# Patient Record
Sex: Female | Born: 1935
Health system: Southern US, Community
[De-identification: ages and names within clinical notes are randomized; demographics above are authoritative.]

## PROBLEM LIST (undated history)

## (undated) DIAGNOSIS — F3289 Other specified depressive episodes: Secondary | ICD-10-CM

## (undated) DIAGNOSIS — M8580 Other specified disorders of bone density and structure, unspecified site: Secondary | ICD-10-CM

## (undated) DIAGNOSIS — E039 Hypothyroidism, unspecified: Secondary | ICD-10-CM

## (undated) DIAGNOSIS — F458 Other somatoform disorders: Secondary | ICD-10-CM

## (undated) DIAGNOSIS — E559 Vitamin D deficiency, unspecified: Secondary | ICD-10-CM

## (undated) DIAGNOSIS — Z9289 Personal history of other medical treatment: Secondary | ICD-10-CM

## (undated) DIAGNOSIS — I10 Essential (primary) hypertension: Secondary | ICD-10-CM

## (undated) DIAGNOSIS — H9190 Unspecified hearing loss, unspecified ear: Secondary | ICD-10-CM

## (undated) DIAGNOSIS — E785 Hyperlipidemia, unspecified: Secondary | ICD-10-CM

## (undated) DIAGNOSIS — F329 Major depressive disorder, single episode, unspecified: Secondary | ICD-10-CM

## (undated) DIAGNOSIS — I480 Paroxysmal atrial fibrillation: Secondary | ICD-10-CM

## (undated) DIAGNOSIS — G4700 Insomnia, unspecified: Secondary | ICD-10-CM

## (undated) HISTORY — PX: TONSILLECTOMY: SUR1361

## (undated) HISTORY — DX: Unspecified hearing loss, unspecified ear: H91.90

## (undated) HISTORY — DX: Major depressive disorder, single episode, unspecified: F32.9

## (undated) HISTORY — DX: Personal history of other medical treatment: Z92.89

## (undated) HISTORY — DX: Hyperlipidemia, unspecified: E78.5

## (undated) HISTORY — DX: Other specified disorders of bone density and structure, unspecified site: M85.80

## (undated) HISTORY — DX: Hypothyroidism, unspecified: E03.9

## (undated) HISTORY — DX: Other specified depressive episodes: F32.89

## (undated) HISTORY — DX: Paroxysmal atrial fibrillation: I48.0

## (undated) HISTORY — DX: Vitamin D deficiency, unspecified: E55.9

## (undated) HISTORY — DX: Other somatoform disorders: F45.8

## (undated) HISTORY — DX: Insomnia, unspecified: G47.00

## (undated) HISTORY — DX: Essential (primary) hypertension: I10

---

## 2005-09-24 LAB — HM COLONOSCOPY: HM Colonoscopy: NORMAL

## 2007-09-25 DIAGNOSIS — I480 Paroxysmal atrial fibrillation: Secondary | ICD-10-CM

## 2007-09-25 HISTORY — DX: Paroxysmal atrial fibrillation: I48.0

## 2007-09-25 HISTORY — PX: APPENDECTOMY: SHX54

## 2009-03-14 ENCOUNTER — Encounter: Payer: Self-pay | Admitting: Internal Medicine

## 2009-03-14 ENCOUNTER — Ambulatory Visit: Payer: Self-pay | Admitting: Internal Medicine

## 2009-03-14 DIAGNOSIS — R002 Palpitations: Secondary | ICD-10-CM | POA: Insufficient documentation

## 2009-03-14 DIAGNOSIS — I10 Essential (primary) hypertension: Secondary | ICD-10-CM | POA: Insufficient documentation

## 2009-03-22 ENCOUNTER — Encounter: Payer: Self-pay | Admitting: Internal Medicine

## 2009-03-29 ENCOUNTER — Telehealth: Payer: Self-pay | Admitting: Internal Medicine

## 2009-04-05 ENCOUNTER — Ambulatory Visit: Payer: Self-pay

## 2009-04-05 ENCOUNTER — Encounter: Payer: Self-pay | Admitting: Internal Medicine

## 2009-04-06 ENCOUNTER — Telehealth: Payer: Self-pay | Admitting: Cardiology

## 2009-04-12 ENCOUNTER — Ambulatory Visit: Payer: Self-pay | Admitting: Family Medicine

## 2009-04-13 ENCOUNTER — Ambulatory Visit: Payer: Self-pay | Admitting: Cardiology

## 2009-04-13 ENCOUNTER — Ambulatory Visit (HOSPITAL_COMMUNITY): Admission: RE | Admit: 2009-04-13 | Discharge: 2009-04-13 | Payer: Self-pay | Admitting: Cardiology

## 2009-04-13 ENCOUNTER — Encounter: Payer: Self-pay | Admitting: Internal Medicine

## 2009-05-25 DIAGNOSIS — Z9289 Personal history of other medical treatment: Secondary | ICD-10-CM

## 2009-05-25 HISTORY — DX: Personal history of other medical treatment: Z92.89

## 2009-06-15 ENCOUNTER — Ambulatory Visit: Payer: Self-pay | Admitting: Internal Medicine

## 2010-03-10 ENCOUNTER — Ambulatory Visit: Payer: Self-pay | Admitting: Cardiovascular Disease

## 2010-03-10 DIAGNOSIS — R079 Chest pain, unspecified: Secondary | ICD-10-CM | POA: Insufficient documentation

## 2010-06-18 ENCOUNTER — Inpatient Hospital Stay: Payer: Self-pay | Admitting: Internal Medicine

## 2010-06-21 ENCOUNTER — Ambulatory Visit: Payer: Self-pay | Admitting: Family Medicine

## 2010-07-03 ENCOUNTER — Ambulatory Visit: Payer: Self-pay | Admitting: Family Medicine

## 2010-07-11 ENCOUNTER — Telehealth: Payer: Self-pay | Admitting: Internal Medicine

## 2010-10-02 ENCOUNTER — Telehealth: Payer: Self-pay | Admitting: Cardiovascular Disease

## 2010-10-24 NOTE — Progress Notes (Signed)
Summary: regarding allergy med  Phone Note Call from Patient Call back at Home Phone [phone]   Caller: Spouse- Charles Call For: Dr. Alphonsus Sias Summary of Call: Pt's husband says pt was seen at [facility] yesterday and was prescribed something for allergies.  He said this was to be a 6 hour pill, but what came from the pharmacy was for every 12 hours.  Please advise. Initial call taken by: Lowella Petties CMA,  July 11, 2010 3:59 PM  Follow-up for Phone Call        I spoke to him later in the day today and Dr Dayton Martes will see her tomorrow  was given loratadine---I never said anything about a "6 hour pill" Follow-up by: Cindee Salt MD,  July 11, 2010 7:43 PM

## 2010-10-24 NOTE — Assessment & Plan Note (Signed)
Summary: EC6/sgc   Visit Type:  Initial Consult Primary Provider:  Nilda Simmer, MD  CC:  Chest pain and .  History of Present Illness: 77 y/o with h/o HTN, HL and PAF, who presents for evaluation of recent episodes of chest pain.  She reports that she had 2 episodes last week, the first was for 30 minutes the second episode was for 10 minutes. They were 2-3/10 on a pain scale. The second one came on prior to giving a talk and she was very nervous. The symptoms came on typically at rest. She has been otherwise active, swims, walks her dog but no significant symptoms of chest pain or shortness of breath.  She does feel very fatigued, has some lower abdominal discomfort which is chronic. She wonders if her symptoms may be due to depression. She's had depression before and wonders if this is similar.  Echo with normal LV function but some ? of LV trabecualtions so cardiac MRI ordered and was normal.  Monitor rshowing One brief run of SVT (5 beats) no recurrent AF.   EKG shows normal sinus rhythm with a rate 63 beats per minute, no significant ST or T wave changes.         Current Medications (verified): 1)  Alendronate Sodium 70 Mg Tabs (Alendronate Sodium) .Marland Kitchen.. 1 Every Two Weeks 2)  Synthroid 75 Mcg Tabs (Levothyroxine Sodium) .Marland Kitchen.. 1 By Mouth Once Daily 3)  Aspirin 81 Mg Tbec (Aspirin) .... Take One Tablet By Mouth Daily 4)  Fish Oil 1200 Mg Caps (Omega-3 Fatty Acids) .Marland Kitchen.. 1 By Mouth Once Daily 5)  Calcium-D 600-200 Mg-Unit Tabs (Calcium Carbonate-Vitamin D) .Marland Kitchen.. 1 By Mouth Once Daily 6)  Vitamin D3 1000 Unit Caps (Cholecalciferol) .Marland Kitchen.. 1 By Mouth Once Daily 7)  Metoprolol Succinate 50 Mg Xr24h-Tab (Metoprolol Succinate) .... Take One Tablet By Mouth Daily 8)  Ambien 5 Mg Tabs (Zolpidem Tartrate) .... Take 1 Tablet By Mouth At Bedtime  Allergies (verified): 1)  ! * Dustmites 2)  ! * Mold 3)  ! * Contrast Dye  Past History:  Past Medical History: Last updated:  06/15/2009 Allergic rhinitis Hypothyroidism Hypertension Hyperlipidemia Osteopenia Paroxysmal Atrial Fibrillation- 2009 [facility] Cardiac MRI 9/10 - normal  Family History: Last updated: 2009/03/13 Father: Deceased; pneumonia; 66 Mother: Deceased 94; heart disease Sister: living-79  Social History: Last updated: 03/14/2009 Retired  Married  Children 3 Tobacco Use - No.  Alcohol Use - yes. 1 glass of wine per night Regular Exercise - yes Drug Use - no  Risk Factors: Exercise: yes (March 13, 2009)  Risk Factors: Smoking Status: never (2009/03/13)  Review of Systems       The patient complains of chest pain.  The patient denies fever, weight loss, weight gain, vision loss, decreased hearing, hoarseness, syncope, dyspnea on exertion, peripheral edema, prolonged cough, abdominal pain, incontinence, muscle weakness, depression, and enlarged lymph nodes.         Poor sleep, needs ambien, fatigue, ABD discomfort  Vital Signs:  Patient profile:   77 year old female Height:      56 inches Weight:      136 pounds BMI:     30.60 Pulse rate:   64 / minute BP sitting:   116 / 72  (left arm) Cuff size:   regular  Physical Exam  General:  Well developed, well nourished, in no acute distress. Head:  normocephalic and atraumatic Neck:  Neck supple, no JVD. No masses, thyromegaly or abnormal cervical nodes. Lungs:  Clear bilaterally  to auscultation and percussion. Heart:  Non-displaced PMI, chest non-tender; regular rate and rhythm, S1, S2 without murmurs, rubs or gallops. Carotid upstroke normal, no bruit. Pedals normal pulses. No edema, no varicosities. Abdomen:  Bowel sounds positive; abdomen soft and non-tender without masses Msk:  Back normal, normal gait. Muscle strength and tone normal. Pulses:  pulses normal in all 4 extremities Extremities:  No clubbing or cyanosis. Neurologic:  Alert and oriented x 3. Skin:  Intact without lesions or rashes. Psych:  Normal  affect.   Impression & Recommendations:  Problem # 1:  CHEST PAIN UNSPECIFIED (ICD-786.50) etiology of her chest pain is likely noncardiac. It comes on at rest, has been rare. She is active with no symptoms. I encouraged her to start swimming as she normally does, continued to walk her dog and to let me know if she continues to have symptoms of chest discomfort with exertion.  She is concerned that her symptoms may be due to depression. I've asked her to see how she feels with increased activity and exercise and if she continues to feel she has problems, to talk to Dr. Katrinka Blazing to see if she needs additional workup.Marland Kitchen  Her updated medication list for this problem includes:    Aspirin 81 Mg Tbec (Aspirin) .Marland Kitchen... Take one tablet by mouth daily    Metoprolol Succinate 50 Mg Xr24h-tab (Metoprolol succinate) .Marland Kitchen... Take one tablet by mouth daily  Orders: EKG w/ Interpretation (93000)  Problem # 2:  HYPERTENSION, BENIGN (ICD-401.1) Blood pressure is reasonably well controlled on her current medication regimen.  Her updated medication list for this problem includes:    Aspirin 81 Mg Tbec (Aspirin) .Marland Kitchen... Take one tablet by mouth daily    Metoprolol Succinate 50 Mg Xr24h-tab (Metoprolol succinate) .Marland Kitchen... Take one tablet by mouth daily  Problem # 3:  PALPITATIONS (ICD-785.1) No symptoms of tachypalpitations concerning for SVT or atrial fibrillation.  Her updated medication list for this problem includes:    Aspirin 81 Mg Tbec (Aspirin) .Marland Kitchen... Take one tablet by mouth daily    Metoprolol Succinate 50 Mg Xr24h-tab (Metoprolol succinate) .Marland Kitchen... Take one tablet by mouth daily  New Orders:     1)  EKG w/ Interpretation (93000)   Patient Instructions: 1)  Your physician recommends that you schedule a follow-up appointment as needed 2)  Patient will call with exertional chest pain

## 2010-10-26 NOTE — Progress Notes (Signed)
Summary: RX  Phone Note Refill Request Call back at Home Phone [phone] Message from:  Patient on October 02, 2010 10:35 AM  Refills Requested: Medication #1:  METOPROLOL SUCCINATE 50 MG XR24H-TAB Take one tablet by mouth daily   Notes: Can pt discontinue?  Copay is too high. [facility] [address]  Initial call taken by: Harlon Flor,  October 02, 2010 10:35 AM Caller: Patient  Follow-up for Phone Call        [facility] TCB  notified patient she can't afford the metoprolol.  What do you recommend her change to?  Follow-up by: Bishop Dublin, CMA,  October 02, 2010 11:58 AM  Additional Follow-up for Phone Call Additional follow up Details #1::        Would change her to metoprolol tartrate 25 mg by mouth BID      Appended Document: RX pt notified, she will try metoprolol tartrate 25mg  two times a day. rx sent.   Clinical Lists Changes  Medications: Changed medication from METOPROLOL SUCCINATE 50 MG XR24H-TAB (METOPROLOL SUCCINATE) Take one tablet by mouth daily to METOPROLOL TARTRATE 25 MG TABS (METOPROLOL TARTRATE) Take one tablet by mouth twice a day - Signed Rx of METOPROLOL TARTRATE 25 MG TABS (METOPROLOL TARTRATE) Take one tablet by mouth twice a day;  #60 x 6;  Signed;  Entered by: Lanny Hurst RN;  Authorized by: Dossie Arbour MD;  Method used: [facility] to [id number] [address], [place], [place]  [address], Ph: [phone], Fax: [phone]    Prescriptions: METOPROLOL TARTRATE 25 MG TABS (METOPROLOL TARTRATE) Take one tablet by mouth twice a day  #60 x 6   Entered by:   Lanny Hurst RN   Authorized by:   Dossie Arbour MD   Signed by:   Lanny Hurst RN on 10/04/2010   Method used:   Electronically to        [id number])       [address]       [place], [place]  [address]       Ph: [phone]       Fax: [phone]   RxID:   [id number]    Appended Document: RX notified patient if cost is too high can call  in a new Rx for Metoprolol Tartrate 25mg  two times a day.  The patient states will continue on the metoprolol succ 50mg  daily for now.

## 2010-12-31 LAB — CREATININE, SERUM
Creatinine, Ser: 0.58 mg/dL (ref 0.4–1.2)
GFR calc Af Amer: >60 mL/min (ref >60)

## 2011-03-15 ENCOUNTER — Encounter: Payer: Self-pay | Admitting: Cardiovascular Disease

## 2011-06-05 ENCOUNTER — Telehealth: Payer: Self-pay | Admitting: Cardiology

## 2011-06-05 NOTE — Telephone Encounter (Signed)
I called pt back to discuss below. Pt's husband answered and said she has gone out, he will have her call me back regarding symptoms, in the meantime will schedule a f/u for pt.

## 2011-06-05 NOTE — Telephone Encounter (Signed)
Pt calling concerning pain in the chest and left arm lasting all morning. No vomiting, no nitro or ASA taken.

## 2011-06-07 NOTE — Telephone Encounter (Signed)
Attempted to contact pt, LMOM TCB.  

## 2011-06-12 ENCOUNTER — Ambulatory Visit (INDEPENDENT_AMBULATORY_CARE_PROVIDER_SITE_OTHER): Payer: Medicare Other | Admitting: Cardiovascular Disease

## 2011-06-12 ENCOUNTER — Encounter: Payer: Self-pay | Admitting: Cardiovascular Disease

## 2011-06-12 DIAGNOSIS — R079 Chest pain, unspecified: Secondary | ICD-10-CM

## 2011-06-12 DIAGNOSIS — I480 Paroxysmal atrial fibrillation: Secondary | ICD-10-CM | POA: Insufficient documentation

## 2011-06-12 DIAGNOSIS — I10 Essential (primary) hypertension: Secondary | ICD-10-CM

## 2011-06-12 DIAGNOSIS — I4891 Unspecified atrial fibrillation: Secondary | ICD-10-CM

## 2011-06-12 NOTE — Progress Notes (Signed)
Patient ID: Karen Colon, female    DOB: Jan 01, 1926, 77 y.o.   MRN: [id number]  HPI Comments: 77 y/o with h/o HTN, HL and PAF, who presents for evaluation of recent episodes of chest pain.   She reports that she has had 2 episodes of chest pain last week. She does not have chest pain on a regular basis. She did have chest pain episodes last year and she was seen in early 2011. The more recent episodes lasted from breakfast until lunch. They were nonexertional related. Mild in nature. She is otherwise active, walks her dogs, takes care of her house and does groceries with no significant symptoms of chest discomfort. Since her episodes, she has been active with no further episodes. The symptoms came on typically at rest.    She's had depression In the past Over the past weekend, she did have an episode of diarrhea after eating thai food   Echo with normal LV function   cardiac MRI  was normal.   Monitor  In the pastshowing One brief run of SVT (5 beats) no  AF.     EKG shows normal sinus rhythm with a rate 71 beats per minute, no significant ST or T wave changes.          Outpatient Encounter Prescriptions as of 06/12/2011  Medication Sig Dispense Refill  . alendronate (FOSAMAX) 70 MG tablet Take 70 mg by mouth every 14 (fourteen) days. Take with a full glass of water on an empty stomach.       Marland Kitchen aspirin 81 MG tablet Take 81 mg by mouth daily.        . Cholecalciferol (VITAMIN D3) 1000 UNITS CAPS Take 1 tablet by mouth daily.        Marland Kitchen levothyroxine (SYNTHROID, LEVOTHROID) 75 MCG tablet Take 75 mcg by mouth daily.        . metoprolol succinate (TOPROL-XL) 25 MG 24 hr tablet Take 25 mg by mouth 2 (two) times daily.        . Omega-3 Fatty Acids (FISH OIL) 1200 MG CAPS Take 1 capsule by mouth daily.        . sertraline (ZOLOFT) 50 MG tablet Take 50 mg by mouth daily.           Review of Systems  Constitutional: Negative.   HENT: Negative.   Eyes: Negative.   Respiratory: Negative.     Cardiovascular: Positive for chest pain.  Gastrointestinal: Negative.   Musculoskeletal: Negative.   Skin: Negative.   Neurological: Negative.   Hematological: Negative.   Psychiatric/Behavioral: Negative.   All other systems reviewed and are negative.    BP 124/80  Pulse 82  Ht 5\' 6"  (1.676 m)  Wt 128 lb (58.06 kg)  BMI 20.66 kg/m2  Physical Exam  Nursing note and vitals reviewed. Constitutional: She is oriented to person, place, and time. She appears well-developed and well-nourished.  HENT:  Head: Normocephalic.  Nose: Nose normal.  Mouth/Throat: Oropharynx is clear and moist.  Eyes: Conjunctivae are normal. Pupils are equal, round, and reactive to light.  Neck: Normal range of motion. Neck supple. No JVD present.  Cardiovascular: Normal rate, regular rhythm, S1 normal, S2 normal, normal heart sounds and intact distal pulses.  Exam reveals no gallop and no friction rub.   No murmur heard. Pulmonary/Chest: Effort normal and breath sounds normal. No respiratory distress. She has no wheezes. She has no rales. She exhibits no tenderness.  Abdominal: Soft. Bowel sounds are normal. She exhibits  no distension. There is no tenderness.  Musculoskeletal: Normal range of motion. She exhibits no edema and no tenderness.  Lymphadenopathy:    She has no cervical adenopathy.  Neurological: She is alert and oriented to person, place, and time. Coordination normal.  Skin: Skin is warm and dry. No rash noted. No erythema.  Psychiatric: She has a normal mood and affect. Her behavior is normal. Judgment and thought content normal.         Assessment and Plan

## 2011-06-12 NOTE — Patient Instructions (Signed)
You are doing well. No medication changes were made. Please call us if you have new issues that need to be addressed before your next appt.  We will call you for a follow up Appt. In 12 months  

## 2011-06-12 NOTE — Assessment & Plan Note (Signed)
Blood pressure is well controlled on today's visit. No changes made to the medications. 

## 2011-06-12 NOTE — Assessment & Plan Note (Signed)
She denies any recent tachycardia palpitations concerning for atrial fibrillation.

## 2011-06-12 NOTE — Assessment & Plan Note (Signed)
Chest pain episodes are very atypical in nature. She is otherwise active with no complaints. I suggested if she has repeat episodes of chest pain, that she try antacids and Pepcid. If she has additional episodes of chest pain with exertion, I have asked her to contact our office for further evaluation, possible stress testing.

## 2011-11-10 ENCOUNTER — Emergency Department: Payer: Self-pay | Admitting: *Deleted

## 2011-11-10 LAB — CBC
HGB: 12.6 g/dL (ref 12.0–16.0)
MCH: 29.8 pg (ref 26.0–34.0)
MCHC: 33.2 g/dL (ref 32.0–36.0)
WBC: 6.9 10*3/uL (ref 3.6–11.0)

## 2011-11-10 LAB — COMPREHENSIVE METABOLIC PANEL
Albumin: 3.5 g/dL (ref 3.4–5.0)
Alkaline Phosphatase: 76 U/L (ref 50–136)
Anion Gap: 11 (ref 7–16)
Bilirubin,Total: 0.3 mg/dL (ref 0.2–1.0)
Calcium, Total: 8.7 mg/dL (ref 8.5–10.1)
Chloride: 107 mmol/L (ref 98–107)
Co2: 26 mmol/L (ref 21–32)
Creatinine: 0.59 mg/dL — ABNORMAL LOW (ref 0.60–1.30)
Osmolality: 287 (ref 275–301)
Potassium: 4.1 mmol/L (ref 3.5–5.1)
Sodium: 144 mmol/L (ref 136–145)
Total Protein: 6.6 g/dL (ref 6.4–8.2)

## 2011-11-10 LAB — CK TOTAL AND CKMB (NOT AT ARMC): CK, Total: 79 U/L (ref 21–215)

## 2011-11-10 LAB — TROPONIN I: Troponin-I: <0.02 ng/mL

## 2011-12-04 DIAGNOSIS — E039 Hypothyroidism, unspecified: Secondary | ICD-10-CM | POA: Insufficient documentation

## 2011-12-04 DIAGNOSIS — F32A Depression, unspecified: Secondary | ICD-10-CM | POA: Insufficient documentation

## 2011-12-04 DIAGNOSIS — M810 Age-related osteoporosis without current pathological fracture: Secondary | ICD-10-CM | POA: Insufficient documentation

## 2012-01-14 ENCOUNTER — Ambulatory Visit: Payer: Self-pay | Admitting: Family Medicine

## 2012-06-07 ENCOUNTER — Encounter: Payer: Self-pay | Admitting: *Deleted

## 2012-06-09 ENCOUNTER — Encounter: Payer: Self-pay | Admitting: Family Medicine

## 2012-06-09 ENCOUNTER — Ambulatory Visit (INDEPENDENT_AMBULATORY_CARE_PROVIDER_SITE_OTHER): Payer: Medicare Other | Admitting: Family Medicine

## 2012-06-09 VITALS — BP 130/84 | HR 73 | Temp 98.1°F | Resp 16 | Ht 64.0 in | Wt 124.8 lb

## 2012-06-09 DIAGNOSIS — R5381 Other malaise: Secondary | ICD-10-CM

## 2012-06-09 DIAGNOSIS — R5383 Other fatigue: Secondary | ICD-10-CM

## 2012-06-09 DIAGNOSIS — Z23 Encounter for immunization: Secondary | ICD-10-CM

## 2012-06-09 DIAGNOSIS — I4891 Unspecified atrial fibrillation: Secondary | ICD-10-CM

## 2012-06-09 DIAGNOSIS — G4700 Insomnia, unspecified: Secondary | ICD-10-CM

## 2012-06-09 DIAGNOSIS — J309 Allergic rhinitis, unspecified: Secondary | ICD-10-CM | POA: Insufficient documentation

## 2012-06-09 DIAGNOSIS — E039 Hypothyroidism, unspecified: Secondary | ICD-10-CM

## 2012-06-09 LAB — COMPREHENSIVE METABOLIC PANEL
ALT: 14 U/L (ref 0–35)
AST: 20 U/L (ref 0–37)
Albumin: 3.8 g/dL (ref 3.5–5.2)
Alkaline Phosphatase: 71 U/L (ref 39–117)
Potassium: 4.3 mEq/L (ref 3.5–5.3)
Sodium: 142 mEq/L (ref 135–145)
Total Bilirubin: 0.3 mg/dL (ref 0.3–1.2)
Total Protein: 5.9 g/dL — ABNORMAL LOW (ref 6.0–8.3)

## 2012-06-09 LAB — CBC WITH DIFFERENTIAL/PLATELET
Basophils Relative: 0 % (ref 0–1)
Eosinophils Relative: 2 % (ref 0–5)
HCT: 37 % (ref 36.0–46.0)
Hemoglobin: 12.3 g/dL (ref 12.0–15.0)
MCH: 29.1 pg (ref 26.0–34.0)
MCHC: 33.2 g/dL (ref 30.0–36.0)
MCV: 87.7 fL (ref 78.0–100.0)
Monocytes Absolute: 0.4 10*3/uL (ref 0.1–1.0)
Monocytes Relative: 5 % (ref 3–12)
Neutro Abs: 6 10*3/uL (ref 1.7–7.7)

## 2012-06-09 LAB — POCT URINALYSIS DIPSTICK
Bilirubin, UA: NEGATIVE
Ketones, UA: NEGATIVE
Spec Grav, UA: 1.02
pH, UA: 6.5

## 2012-06-09 LAB — POCT UA - MICROSCOPIC ONLY: Crystals, Ur, HPF, POC: NEGATIVE

## 2012-06-09 MED ORDER — MIRTAZAPINE 15 MG PO TABS: 15.0000 mg | ORAL_TABLET | Freq: Every day | ORAL | Status: DC

## 2012-06-09 MED ORDER — DILTIAZEM HCL ER COATED BEADS 120 MG PO CP24: 120.0000 mg | ORAL_CAPSULE | Freq: Every day | ORAL | Status: DC

## 2012-06-09 NOTE — Progress Notes (Signed)
Subjective:    Patient ID: Karen Colon, female    DOB: 1925/12/31, 78 y.o.   MRN: [id number]  HPI This 78 y.o. female presents for evaluation of the following  1.    Atrial Fibrillation; stable; denies chest pain, palpitations, shortness of breath, leg swelling.  Compliance with medication; good tolerance of medication; good symptom control.  No recent cardiology evaluation.  Dr. Willeen Cass of ENT advised that patient must stop Metoprolol to start allergy shots.  3.  Dizziness:  Resolved since visit in 12/2011; no recurrence.  S/p MRI brain negative for acute process.  4.  Allergic Rhinitis:  Really bad currently; s/p consultation by Willeen Cass at ENT.  Had labs performed to evaluate for allergies and results still pending; history of allergic shots but unable to take shots if on Metoprolol. Requesting to stop Metoprolol.  5. Hypothyroidism: stable; compliance with medication; good tolerance to medication; good symptom control.   6.  Exhaustion: extreme;onset two months ago; too tired to cook dinner at times.   Daughter is recommending B12 shots.  Gets so tired does not want to cook dinner.  Not sleeping well; wakes up multiple times per night; mind is racing due to multiple stressors.  Sometimes awake a long time.  1-2 hours per night; unable to shut off.  Son is much better.  Involved in [facility] on committees and may be over committed.  Has lost four pounds in past six months.  Appetite normal.    7.  Stress:  A lot of stress and responsibilities.    8.  Flu vaccine:  Agreeable.  Review of Systems  Constitutional: Positive for fatigue and unexpected weight change. Negative for fever, chills, diaphoresis, activity change and appetite change.  HENT: Positive for congestion, rhinorrhea, sneezing and postnasal drip.   Respiratory: Negative for cough, chest tightness, shortness of breath, wheezing and stridor.   Cardiovascular: Negative for chest pain, palpitations and leg swelling.    Gastrointestinal: Negative for nausea, vomiting, abdominal pain, diarrhea and constipation.  Genitourinary: Negative for dysuria, frequency and pelvic pain.  Musculoskeletal: Negative for arthralgias.  Skin: Negative for rash.  Neurological: Negative for dizziness, tremors, seizures, syncope, facial asymmetry, light-headedness, numbness and headaches.  Psychiatric/Behavioral: Positive for disturbed wake/sleep cycle. Negative for dysphoric mood and decreased concentration. The patient is nervous/anxious.         Past Medical History  Diagnosis Date  . Allergic rhinitis   . Hypothyroidism   . Hypertension   . Hyperlipidemia   . Osteopenia   . Paroxysmal a-fib 2009    [facility]  . History of MRI 9/10    Cardiac MRI, normal  . Diarrhea   . Teeth grinding   . allergy historyh status   . Allergy history unknown   . Hearing loss     bilateral hearing aides  . Unspecified vitamin D deficiency   . Insomnia, unspecified   . Depressive disorder, not elsewhere classified   . Measles   . Chicken pox   . Mumps   . Dizziness     Past Surgical History  Procedure Date  . Tonsillectomy   . Appendectomy 2009    Prior to Admission medications   Medication Sig Start Date End Date Taking? Authorizing Provider  alendronate (FOSAMAX) 70 MG tablet Take 70 mg by mouth every 14 (fourteen) days. Take with a full glass of water on an empty stomach.    Yes Historical Provider, MD  aspirin 81 MG tablet Take 81 mg by mouth daily.  Yes Historical Provider, MD  Cholecalciferol (VITAMIN D3) 1000 UNITS CAPS Take 1 tablet by mouth daily.     Yes Historical Provider, MD  fluticasone (FLONASE) 50 MCG/ACT nasal spray Place 2 sprays into the nose daily.   Yes Historical Provider, MD  levothyroxine (SYNTHROID, LEVOTHROID) 75 MCG tablet Take 75 mcg by mouth daily.     Yes Historical Provider, MD  metoprolol succinate (TOPROL-XL) 25 MG 24 hr tablet Take 25 mg by mouth 2 (two) times daily.     Yes Historical  Provider, MD  Omega-3 Fatty Acids (FISH OIL) 1200 MG CAPS Take 1 capsule by mouth daily.     Yes Historical Provider, MD  diltiazem (CARDIZEM CD) 120 MG 24 hr capsule Take 1 capsule (120 mg total) by mouth daily. 06/09/12   Ethelda Chick, MD  mirtazapine (REMERON) 15 MG tablet Take 1 tablet (15 mg total) by mouth at bedtime. 06/09/12   Ethelda Chick, MD  sertraline (ZOLOFT) 50 MG tablet Take 50 mg by mouth daily.      Historical Provider, MD    Allergies  Allergen Reactions  . Ciprofloxacin     Muscle spasm   . Iodinated Diagnostic Agents     Iodine DYe  . Other     Dust Mites    History   Social History  . Marital Status: Married    Spouse Name: N/A    Number of Children: 3  . Years of Education: N/A   Occupational History  . Retired    Social History Main Topics  . Smoking status: Never Smoker   . Smokeless tobacco: Not on file  . Alcohol Use: Yes     1 glass of wine per night  . Drug Use: No  . Sexually Active: Not on file   Other Topics Concern  . Not on file   Social History Narrative   Regular exercise: Yes Light 5-10 minutes; walking dog twice a day.Married; x 59 years moderately happy;+verbal abuse;denies physical abuse.Smoke alarm in the home. No guns in the home.Caffeine use moderate, Always uses seat belts. Activities: +driving,cleans house,+grocery shopping,washes clothes,performs ADL's.    Family History  Problem Relation Age of Onset  . Heart disease Mother   . Pneumonia Father     Objective:   Physical Exam  Constitutional: She is oriented to person, place, and time. She appears well-developed and well-nourished.  HENT:  Head: Normocephalic and atraumatic.  Right Ear: External ear normal.  Left Ear: External ear normal.  Nose: Nose normal.  Mouth/Throat: Oropharynx is clear and moist.  Eyes: Conjunctivae normal and EOM are normal. Pupils are equal, round, and reactive to light.  Neck: Normal range of motion. Neck supple. No thyromegaly  present.  Cardiovascular: Normal rate, regular rhythm, normal heart sounds and intact distal pulses.   No murmur heard. Pulmonary/Chest: Effort normal and breath sounds normal. No respiratory distress. She has no wheezes. She has no rales.  Abdominal: Soft. Bowel sounds are normal. She exhibits no distension. There is no tenderness. There is no rebound and no guarding.  Musculoskeletal: Normal range of motion.  Lymphadenopathy:    She has no cervical adenopathy.  Neurological: She is alert and oriented to person, place, and time. No cranial nerve deficit. She exhibits normal muscle tone.  Skin: Skin is warm and dry. No rash noted. She is not diaphoretic.  Psychiatric: She has a normal mood and affect. Her behavior is normal. Judgment and thought content normal.     FLU  VACCINE ADMINISTERED.      Assessment & Plan:   1. Fatigue  Comprehensive metabolic panel, POCT urinalysis dipstick, CBC with Differential, POCT UA - Microscopic Only  2. Hypothyroidism  TSH  3. Atrial fibrillation  diltiazem (CARDIZEM CD) 120 MG 24 hr capsule  4. Insomnia  mirtazapine (REMERON) 15 MG tablet  5. Need for prophylactic vaccination and inoculation against influenza  Flu vaccine greater than or equal to 3yo preservative free IM  6.  Allergic Rhinitis   1. Fatigue: New.  Obtain labs, u/a.  Treat underlying insomnia and reevaluate.  OK to start B complex vitamin daily.  Stressors may also be contributing to fatigue.   2.  Hypothyroidism: stable; obtain labs; continue current medications. 3. Atrial fibrillation: stable; will stop Metoprolol ER 25mg  daily and start Cardizem CD 120mg  daily; RTC six weeks for follow-up.  4.  Insomnia:  New.  Contributing to fatigue.  Continue Melatonin qhs; start Remeron 15mg  one po qhs.  F/u 6 weeks.   5. Allergic Rhinitis: Recurrent.  S/p ENT consultation last week by Willeen Cass; labs pending regarding allergies; agreeable to switching Metoprolol to Cardizem CD. 6.  S/p flu  vaccine.

## 2012-06-10 ENCOUNTER — Encounter: Payer: Self-pay | Admitting: Family Medicine

## 2012-06-10 NOTE — Progress Notes (Signed)
Reviewed and agree.

## 2012-06-18 ENCOUNTER — Telehealth: Payer: Self-pay | Admitting: *Deleted

## 2012-06-18 ENCOUNTER — Telehealth: Payer: Self-pay

## 2012-06-18 NOTE — Telephone Encounter (Signed)
Pt needs all medical records mailed to her home.  [address] [place], [place] [address]

## 2012-06-18 NOTE — Telephone Encounter (Signed)
This is a good medicine for palpitations and atrial fibrillation We need to know blood pressure and heart rate on this medication to make sure it is okay If they could bring this to her office visit

## 2012-06-18 NOTE — Telephone Encounter (Signed)
Spoke with pt's daughter and she mentioned that pt could not tolerate Metoprolol 25 mg BID c/o dizziness and exhaustion.Primary care doctor changed her to Diltiazem 120 mg daily. Pt's daughter wanted to inform Dr. Mariah Milling and see if he recommends her to take Diltiazem in place of Metoprolol and would like his opinion on dosage and if he recommends her to start medication? Pt is due for 1 yr f/u appointment and has been scheduled for an appointment on Oct. 2, 2013. Please Advise.

## 2012-06-19 NOTE — Telephone Encounter (Signed)
Records mailed to home address.

## 2012-06-19 NOTE — Telephone Encounter (Signed)
dtr called back Informed of Dr. Windell Hummingbird opinion Understanding verb I advised her to call if SBP <100 mmhg or HR<60 BPM

## 2012-06-19 NOTE — Telephone Encounter (Signed)
lmtcb

## 2012-06-25 ENCOUNTER — Encounter: Payer: Self-pay | Admitting: Cardiovascular Disease

## 2012-06-25 ENCOUNTER — Ambulatory Visit (INDEPENDENT_AMBULATORY_CARE_PROVIDER_SITE_OTHER): Payer: Medicare Other | Admitting: Cardiovascular Disease

## 2012-06-25 VITALS — BP 150/72 | HR 73 | Ht 66.0 in | Wt 126.8 lb

## 2012-06-25 DIAGNOSIS — R079 Chest pain, unspecified: Secondary | ICD-10-CM

## 2012-06-25 DIAGNOSIS — R413 Other amnesia: Secondary | ICD-10-CM

## 2012-06-25 DIAGNOSIS — R5381 Other malaise: Secondary | ICD-10-CM

## 2012-06-25 DIAGNOSIS — R5383 Other fatigue: Secondary | ICD-10-CM

## 2012-06-25 DIAGNOSIS — R002 Palpitations: Secondary | ICD-10-CM

## 2012-06-25 DIAGNOSIS — G3184 Mild cognitive impairment, so stated: Secondary | ICD-10-CM | POA: Insufficient documentation

## 2012-06-25 DIAGNOSIS — I4891 Unspecified atrial fibrillation: Secondary | ICD-10-CM

## 2012-06-25 DIAGNOSIS — F0150 Vascular dementia without behavioral disturbance: Secondary | ICD-10-CM | POA: Insufficient documentation

## 2012-06-25 NOTE — Assessment & Plan Note (Signed)
Significant time spent stressing possible causes of her fatigue. Recent lab work looked normal including thyroid, CBC, basic metabolic panel, LFTs, etc. No change by holding her metoprolol. We'll hold the diltiazem for one week and monitor her blood pressure. If no improvement in her fatigue symptoms, we have suggested she restart the diltiazem. We have encouraged her to restart her exercise activities. This will likely help her conditioning and her fatigue.

## 2012-06-25 NOTE — Assessment & Plan Note (Signed)
No recent episodes of arrhythmia. We will hold her diltiazem for one week to see if fatigue improves. She will monitor her blood pressure and heart rate. If no improvement in her symptoms, she will restart diltiazem.

## 2012-06-25 NOTE — Assessment & Plan Note (Signed)
Symptoms appear mild. Daughter is aware.

## 2012-06-25 NOTE — Patient Instructions (Addendum)
You are doing well.  Please do a trial hold of the diltiazem/cardizem for one week Please call the office with your blood pressure numbers If fatigue does not improve, we would restart the cardizem  Please call us if you have new issues that need to be addressed before your next appt.  Your physician wants you to follow-up in: 6 months.  You will receive a reminder letter in the mail two months in advance. If you don't receive a letter, please call our office to schedule the follow-up appointment.

## 2012-06-25 NOTE — Assessment & Plan Note (Signed)
No further episodes of chest pain. 

## 2012-06-25 NOTE — Progress Notes (Signed)
Patient ID: Karen Colon, female    DOB: Feb 26, 1926, 78 y.o.   MRN: [id number]  HPI Comments: 78 y/o with h/o HTN, HL and PAF (last episode several years ago), memory problems, prior episodes of chest pain,  who presents for routine followup.  She denies any other further episodes of chest pain. She reports that her balance is getting poor. She is not very active at baseline. She used to swim and do other activities such as treadmill but she has not been doing this recently. She is concerned about her fatigue which has been a chronic issue. Dr. Nilda Simmer recently changed her metoprolol to diltiazem 120 mg daily. Over the past week, since this change was made, she has not noticed any improvement in her fatigue. She was started on Remeron for sleep and sleep has significantly improved with no improvement of her fatigue. She is concerned about the diltiazem causing fatigue. She's had depression In the past   Echo with normal LV function   cardiac MRI  was normal.   Holter Monitor  In the pastshowing One brief run of SVT (5 beats) no  AF.     EKG shows normal sinus rhythm with a rate 73 beats per minute, no significant ST or T wave changes.          Outpatient Encounter Prescriptions as of 06/25/2012  Medication Sig Dispense Refill  . alendronate (FOSAMAX) 70 MG tablet Take 70 mg by mouth every 14 (fourteen) days. Take with a full glass of water on an empty stomach.       Marland Kitchen aspirin 81 MG tablet Take 81 mg by mouth daily.        . Cholecalciferol (VITAMIN D3) 1000 UNITS CAPS Take 1 tablet by mouth daily.        . citalopram (CELEXA) 10 MG tablet Take 10 mg by mouth daily.      Marland Kitchen diltiazem (CARDIZEM CD) 120 MG 24 hr capsule Take 1 capsule (120 mg total) by mouth daily.  30 capsule  5  . fluticasone (FLONASE) 50 MCG/ACT nasal spray Place 2 sprays into the nose daily.      Marland Kitchen levothyroxine (SYNTHROID, LEVOTHROID) 75 MCG tablet Take 75 mcg by mouth daily.        . mirtazapine (REMERON) 15 MG  tablet Take 1 tablet (15 mg total) by mouth at bedtime.  30 tablet  5  . Omega-3 Fatty Acids (FISH OIL) 1200 MG CAPS Take 1 capsule by mouth daily.        Marland Kitchen DISCONTD: metoprolol succinate (TOPROL-XL) 25 MG 24 hr tablet Take 25 mg by mouth 2 (two) times daily.        Marland Kitchen DISCONTD: sertraline (ZOLOFT) 50 MG tablet Take 50 mg by mouth daily.           Review of Systems  Constitutional: Positive for fatigue.  HENT: Negative.   Eyes: Negative.   Respiratory: Negative.   Gastrointestinal: Negative.   Musculoskeletal: Negative.   Skin: Negative.   Neurological: Negative.   Hematological: Negative.   Psychiatric/Behavioral: Negative.   All other systems reviewed and are negative.    BP 150/72  Pulse 73  Ht 5\' 6"  (1.676 m)  Wt 126 lb 12 oz (57.493 kg)  BMI 20.46 kg/m2 She reports blood pressure is elevated today as she was rushing to get to the office. Physical Exam  Nursing note and vitals reviewed. Constitutional: She is oriented to person, place, and time. She appears well-developed and well-nourished.  HENT:  Head: Normocephalic.  Nose: Nose normal.  Mouth/Throat: Oropharynx is clear and moist.  Eyes: Conjunctivae normal are normal. Pupils are equal, round, and reactive to light.  Neck: Normal range of motion. Neck supple. No JVD present.  Cardiovascular: Normal rate, regular rhythm, S1 normal, S2 normal, normal heart sounds and intact distal pulses.  Exam reveals no gallop and no friction rub.   No murmur heard. Pulmonary/Chest: Effort normal and breath sounds normal. No respiratory distress. She has no wheezes. She has no rales. She exhibits no tenderness.  Abdominal: Soft. Bowel sounds are normal. She exhibits no distension. There is no tenderness.  Musculoskeletal: Normal range of motion. She exhibits no edema and no tenderness.  Lymphadenopathy:    She has no cervical adenopathy.  Neurological: She is alert and oriented to person, place, and time. Coordination normal.    Skin: Skin is warm and dry. No rash noted. No erythema.  Psychiatric: She has a normal mood and affect. Her behavior is normal. Judgment and thought content normal.         Assessment and Plan

## 2012-06-25 NOTE — Assessment & Plan Note (Signed)
No palpitations since her last visit concerning for arrhythmia.

## 2012-07-01 ENCOUNTER — Telehealth: Payer: Self-pay | Admitting: Cardiovascular Disease

## 2012-07-01 NOTE — Telephone Encounter (Signed)
Blood pressure looks great Would continue current  medication as it looks  good

## 2012-07-01 NOTE — Telephone Encounter (Signed)
Patient called with a daily list of her blood pressures for the past 6 days: 130/70, 136/78, 140/81, 121/73, 119/76, 141/86.  Patient is going out of town but gave her cell phone to leave a detailed message [phone].

## 2012-07-01 NOTE — Telephone Encounter (Signed)
See below and advise thanks 

## 2012-07-02 NOTE — Telephone Encounter (Signed)
LM of finding and Dr. Windell Hummingbird response.

## 2012-07-07 ENCOUNTER — Telehealth: Payer: Self-pay

## 2012-07-07 NOTE — Telephone Encounter (Signed)
Pt called back. I gave her Dr. Windell Hummingbird instructions re: BP's ( see telephone note). I advised, per Dr. Mariah Milling, continue current regimen, BPs look good. Understanding verb.

## 2012-07-28 ENCOUNTER — Ambulatory Visit: Payer: Medicare Other | Admitting: Family Medicine

## 2012-08-28 ENCOUNTER — Telehealth: Payer: Self-pay | Admitting: *Deleted

## 2012-12-22 ENCOUNTER — Ambulatory Visit: Payer: Medicare Other | Admitting: Cardiovascular Disease

## 2012-12-22 ENCOUNTER — Encounter: Payer: Self-pay | Admitting: Cardiovascular Disease

## 2012-12-22 ENCOUNTER — Ambulatory Visit (INDEPENDENT_AMBULATORY_CARE_PROVIDER_SITE_OTHER): Payer: Medicare Other | Admitting: Cardiovascular Disease

## 2012-12-22 VITALS — BP 120/72 | HR 84 | Ht 66.0 in | Wt 125.5 lb

## 2012-12-22 DIAGNOSIS — R5383 Other fatigue: Secondary | ICD-10-CM

## 2012-12-22 DIAGNOSIS — I4891 Unspecified atrial fibrillation: Secondary | ICD-10-CM

## 2012-12-22 DIAGNOSIS — R413 Other amnesia: Secondary | ICD-10-CM

## 2012-12-22 DIAGNOSIS — R5381 Other malaise: Secondary | ICD-10-CM

## 2012-12-22 DIAGNOSIS — I10 Essential (primary) hypertension: Secondary | ICD-10-CM

## 2012-12-22 NOTE — Assessment & Plan Note (Signed)
Fatigue has resolved, at least by her account today

## 2012-12-22 NOTE — Progress Notes (Signed)
Patient ID: Karen Colon, female    DOB: 1926-05-18, 79 y.o.   MRN: [id number]  HPI Comments: 79 y/o with h/o HTN, HL and PAF, memory problems, prior episodes of chest pain,  who presents for routine followup.  She denies any other further episodes of chest pain. She reports that her balance is getting poor. She is not very active at baseline.  She is confused about previously been on diltiazem or metoprolol. These medications were held secondary to her symptoms of fatigue. She is currently not on diltiazem and metoprolol for the past 6 months and reports no significant fatigue. She does have bronchitis and is getting over this.   She's had depression In the past   Echo with normal LV function   cardiac MRI  was normal.   Holter Monitor  In the pastshowing One brief run of SVT (5 beats) no  AF.     EKG shows normal sinus rhythm with a rate 84 beats per minute, no significant ST or T wave changes.          Outpatient Encounter Prescriptions as of 12/22/2012  Medication Sig Dispense Refill  . alendronate (FOSAMAX) 70 MG tablet Take 70 mg by mouth every 14 (fourteen) days. Take with a full glass of water on an empty stomach.       Marland Kitchen aspirin 81 MG tablet Take 81 mg by mouth daily.        . Cholecalciferol (VITAMIN D3) 1000 UNITS CAPS Take 1 tablet by mouth daily.        . fluticasone (FLONASE) 50 MCG/ACT nasal spray Place 2 sprays into the nose daily.      Marland Kitchen levothyroxine (SYNTHROID, LEVOTHROID) 75 MCG tablet Take 75 mcg by mouth daily.        . [DISCONTINUED] mirtazapine (REMERON) 15 MG tablet Take 1 tablet (15 mg total) by mouth at bedtime.  30 tablet  5  . [DISCONTINUED] Omega-3 Fatty Acids (FISH OIL) 1200 MG CAPS Take 1 capsule by mouth daily.        . [DISCONTINUED] citalopram (CELEXA) 10 MG tablet Take 10 mg by mouth daily.      . [DISCONTINUED] diltiazem (CARDIZEM CD) 120 MG 24 hr capsule Take 1 capsule (120 mg total) by mouth daily.  30 capsule  5   No facility-administered  encounter medications on file as of 12/22/2012.     Review of Systems  HENT: Negative.   Eyes: Negative.   Respiratory: Positive for cough.   Gastrointestinal: Negative.   Musculoskeletal: Negative.   Skin: Negative.   Neurological: Negative.   Psychiatric/Behavioral: Negative.   All other systems reviewed and are negative.    BP 120/72  Pulse 84  Ht 5\' 6"  (1.676 m)  Wt 125 lb 8 oz (56.926 kg)  BMI 20.27 kg/m2  Physical Exam  Nursing note and vitals reviewed. Constitutional: She is oriented to person, place, and time. She appears well-developed and well-nourished.  HENT:  Head: Normocephalic.  Nose: Nose normal.  Mouth/Throat: Oropharynx is clear and moist.  Eyes: Conjunctivae are normal. Pupils are equal, round, and reactive to light.  Neck: Normal range of motion. Neck supple. No JVD present.  Cardiovascular: Normal rate, regular rhythm, S1 normal, S2 normal, normal heart sounds and intact distal pulses.  Exam reveals no gallop and no friction rub.   No murmur heard. Pulmonary/Chest: Effort normal and breath sounds normal. No respiratory distress. She has no wheezes. She has no rales. She exhibits no tenderness.  Abdominal:  Soft. Bowel sounds are normal. She exhibits no distension. There is no tenderness.  Musculoskeletal: Normal range of motion. She exhibits no edema and no tenderness.  Lymphadenopathy:    She has no cervical adenopathy.  Neurological: She is alert and oriented to person, place, and time. Coordination normal.  Skin: Skin is warm and dry. No rash noted. No erythema.  Psychiatric: She has a normal mood and affect. Her behavior is normal. Judgment and thought content normal.    Assessment and Plan

## 2012-12-22 NOTE — Patient Instructions (Addendum)
You are doing well. No medication changes were made.  Please call us if you have new issues that need to be addressed before your next appt.  Your physician wants you to follow-up in: 12 months.  You will receive a reminder letter in the mail two months in advance. If you don't receive a letter, please call our office to schedule the follow-up appointment. 

## 2012-12-22 NOTE — Assessment & Plan Note (Signed)
Some problems today remembering previous medications and previous office visit discussions.

## 2012-12-22 NOTE — Assessment & Plan Note (Signed)
Blood pressure adequate without significant medications

## 2012-12-22 NOTE — Assessment & Plan Note (Addendum)
No recurrence of her atrial fibrillation. She denies any symptoms. She could not tolerate diltiazem or metoprolol secondary to symptoms of fatigue.

## 2013-02-02 ENCOUNTER — Other Ambulatory Visit: Payer: Self-pay

## 2013-02-02 ENCOUNTER — Telehealth: Payer: Self-pay | Admitting: *Deleted

## 2013-02-02 DIAGNOSIS — R42 Dizziness and giddiness: Secondary | ICD-10-CM

## 2013-02-02 NOTE — Telephone Encounter (Signed)
Can you please schedule?

## 2013-02-02 NOTE — Telephone Encounter (Signed)
Pt says she has been having these spells of feeling "faint", usually in am after breakfast Says nurse checked BP this am and it was "perfect" Feeling better now Has a hx of atrial fib, but has not had this for "years" Denies palpitations or irregular heartbeat I told her it may be that we need to have her wear a monitor but I will check with MD first and call her back Understanding verb (Only take ASA 81 mg from a cardiac standpoint)

## 2013-02-02 NOTE — Telephone Encounter (Signed)
Order 48 hrs Holter monitor.

## 2013-02-02 NOTE — Telephone Encounter (Signed)
Gollan pt Do you mind reviewing?

## 2013-02-02 NOTE — Telephone Encounter (Signed)
Pt calling stating that she has been feeling dizzy, her BP is running normal. Like to talk with nurse

## 2013-02-02 NOTE — Telephone Encounter (Signed)
I have contacted Consuella Lose with [facility] and she will contact the patient for when is a good time to have the 48 hour monitor placed.

## 2013-02-04 DIAGNOSIS — R55 Syncope and collapse: Secondary | ICD-10-CM

## 2013-02-13 ENCOUNTER — Telehealth: Payer: Self-pay

## 2013-02-13 NOTE — Telephone Encounter (Signed)
Pt would like holter monitor results.  

## 2013-02-13 NOTE — Telephone Encounter (Signed)
Do we have these results?

## 2013-02-17 NOTE — Telephone Encounter (Signed)
NSR. No A-fib, 3 short runs of SVT. She might need a medication to slow down HR but she had side effects in the past. Schedule her to see Dr. Mariah Milling to discuss this. This is not urgent.

## 2013-02-17 NOTE — Telephone Encounter (Signed)
Dr. Kirke Corin has in his office to read.

## 2013-02-17 NOTE — Telephone Encounter (Signed)
Please review wt your convenience Pt requesting results Thanks!

## 2013-02-18 ENCOUNTER — Other Ambulatory Visit: Payer: Self-pay

## 2013-02-18 ENCOUNTER — Ambulatory Visit (INDEPENDENT_AMBULATORY_CARE_PROVIDER_SITE_OTHER): Payer: Medicare Other

## 2013-02-18 DIAGNOSIS — R42 Dizziness and giddiness: Secondary | ICD-10-CM

## 2013-02-18 DIAGNOSIS — R002 Palpitations: Secondary | ICD-10-CM

## 2013-02-18 DIAGNOSIS — I4891 Unspecified atrial fibrillation: Secondary | ICD-10-CM

## 2013-02-18 NOTE — Telephone Encounter (Signed)
LMOM TCB regarding holter monitor.

## 2013-02-18 NOTE — Progress Notes (Signed)
[facility] placed 48 hour holter monitor 02/04/13-02/06/13. Karen Colon

## 2013-02-19 NOTE — Telephone Encounter (Signed)
I called pt back [id number] She says she has tried to call us back x3 with "no answer" Says she left message with someone that she has called back but no one called her back I apologized for this and explained I was unaware she had called back at all  I explained HM results to her and offered appt with Dr. Mariah Milling She declines at this time and will call us back should she need appt

## 2013-04-03 ENCOUNTER — Telehealth: Payer: Self-pay | Admitting: *Deleted

## 2013-04-03 NOTE — Telephone Encounter (Signed)
Pt called stating she thinks she is having a heart attack. describes cp mid chest lasting 1 hour, pain is 4/10, pt was walking her dog when it started. Denies sob/ transferal pain/ nausea and someone took her vitals and states they were "good". Explained to pt that her symptoms need to be worked up at the emergency room.pt declines going, spoke with Dr Kirke Corin, pt is to go to ED, pt states she will go if it happens again. I encouraged her to go to ED pt will make a discission later.

## 2013-04-03 NOTE — Telephone Encounter (Signed)
Spoke with daughter/ she is frustrated that her mom wont go to ED. Pt is very stressed and feels it may be a reaction to stress. Daughter would like her mom to be seen in office sooner, Dr Mariah Milling is 100% booked but I told them to call Monday and see if we can see her sooner tan scheduled app, daughter agreed to plan.

## 2013-04-03 NOTE — Telephone Encounter (Signed)
Pt daughter called and states she went to see PCP, states she was a "zombie" gets out of bed then goes back to sleep on couch, states she is usually very active, currently has fatigue.Please call . Daughter states if she has chest pain, she is not going to the ED.

## 2013-04-05 NOTE — Telephone Encounter (Signed)
Difficult to say what is happening, She has had depression and fatigue before, Also had chest pain before. If she continues to have problems  We can try to see her in the office

## 2013-04-07 NOTE — Telephone Encounter (Signed)
Pt says she has had no more CP since Friday 7/11 She will keep appt with [place] as scheduled for 7/18

## 2013-04-10 ENCOUNTER — Ambulatory Visit (INDEPENDENT_AMBULATORY_CARE_PROVIDER_SITE_OTHER): Payer: Medicare Other | Admitting: Cardiovascular Disease

## 2013-04-10 ENCOUNTER — Encounter: Payer: Self-pay | Admitting: Cardiovascular Disease

## 2013-04-10 VITALS — BP 160/86 | HR 86 | Ht 66.0 in | Wt 125.5 lb

## 2013-04-10 DIAGNOSIS — R5383 Other fatigue: Secondary | ICD-10-CM

## 2013-04-10 DIAGNOSIS — I4891 Unspecified atrial fibrillation: Secondary | ICD-10-CM

## 2013-04-10 DIAGNOSIS — R413 Other amnesia: Secondary | ICD-10-CM

## 2013-04-10 DIAGNOSIS — I10 Essential (primary) hypertension: Secondary | ICD-10-CM

## 2013-04-10 DIAGNOSIS — R5381 Other malaise: Secondary | ICD-10-CM

## 2013-04-10 DIAGNOSIS — R197 Diarrhea, unspecified: Secondary | ICD-10-CM | POA: Insufficient documentation

## 2013-04-10 DIAGNOSIS — R079 Chest pain, unspecified: Secondary | ICD-10-CM

## 2013-04-10 MED ORDER — NITROGLYCERIN 0.4 MG SL SUBL: 0.4000 mg | SUBLINGUAL_TABLET | SUBLINGUAL | Status: DC | PRN

## 2013-04-10 NOTE — Assessment & Plan Note (Signed)
Appears relatively stable on today's visit. Possible mild dementia.

## 2013-04-10 NOTE — Assessment & Plan Note (Signed)
Fatigue and lethargy has been a chronic issue. Suspect secondary to depression and boredom. She reports being bored with nothing to do.

## 2013-04-10 NOTE — Progress Notes (Signed)
Patient ID: Karen Colon, female    DOB: 11-27-1925, 79 y.o.   MRN: [id number]  HPI Comments: 79 y/o with h/o HTN, HL and PAF, memory problems, prior episodes of chest pain,  who presents for routine followup. Previous  Echo with normal LV function   cardiac MRI  was normal 2010 with no mention of coronary artery disease.   She presents today and reports having 3 issues. She has lethargy, chronic diarrhea and chest pain episodes . Chest pain episode last week lasting more than one hour. Does not have chest pain on a regular basis, not with exertion . Symptoms resolved on their own without intervention .  She does not have chest pain with activity. She does not do any regular exercise.  Symptoms of lethargy, feeling that she has to force herself to get up. Reports that she sleeps well. She has started getting more active at twin [place], [employer] parties.  Chronic diarrhea, 2 times per day. Imodium does not seem to be helping. Balance is still poor. Not very active at baseline. Some medication confusion in the past. She's had depression In the past   Holter Monitor  In the past showing 3 brief runs of SVT (5 beats) no  AF.  This was done in May 2014   EKG shows normal sinus rhythm with a rate 86 beats per minute, no significant ST or T wave changes.          Outpatient Encounter Prescriptions as of 04/10/2013  Medication Sig Dispense Refill  . alendronate (FOSAMAX) 70 MG tablet Take 70 mg by mouth every 14 (fourteen) days. Take with a full glass of water on an empty stomach.       Marland Kitchen aspirin 81 MG tablet Take 81 mg by mouth daily.        . Cholecalciferol (VITAMIN D3) 1000 UNITS CAPS Take 1 tablet by mouth daily.        . fluticasone (FLONASE) 50 MCG/ACT nasal spray Place 2 sprays into the nose daily.      Marland Kitchen levothyroxine (SYNTHROID, LEVOTHROID) 75 MCG tablet Take 75 mcg by mouth daily.        . Loperamide HCl (ANTI-DIARRHEAL PO) Take by mouth daily.      . sertraline (ZOLOFT) 50  MG tablet Take 50 mg by mouth daily.        No facility-administered encounter medications on file as of 04/10/2013.     Review of Systems  Constitutional: Positive for fatigue.  HENT: Negative.   Eyes: Negative.   Cardiovascular: Positive for chest pain.  Gastrointestinal: Positive for diarrhea.  Musculoskeletal: Negative.   Skin: Negative.   Neurological: Negative.   Psychiatric/Behavioral: Negative.   All other systems reviewed and are negative.    BP 160/86  Pulse 86  Ht 5\' 6"  (1.676 m)  Wt 125 lb 8 oz (56.926 kg)  BMI 20.27 kg/m2  Physical Exam  Nursing note and vitals reviewed. Constitutional: She is oriented to person, place, and time. She appears well-developed and well-nourished.  HENT:  Head: Normocephalic.  Nose: Nose normal.  Mouth/Throat: Oropharynx is clear and moist.  Eyes: Conjunctivae are normal. Pupils are equal, round, and reactive to light.  Neck: Normal range of motion. Neck supple. No JVD present.  Cardiovascular: Normal rate, regular rhythm, S1 normal, S2 normal, normal heart sounds and intact distal pulses.  Exam reveals no gallop and no friction rub.   No murmur heard. Pulmonary/Chest: Effort normal and breath sounds normal. No respiratory  distress. She has no wheezes. She has no rales. She exhibits no tenderness.  Abdominal: Soft. Bowel sounds are normal. She exhibits no distension. There is no tenderness.  Musculoskeletal: Normal range of motion. She exhibits no edema and no tenderness.  Lymphadenopathy:    She has no cervical adenopathy.  Neurological: She is alert and oriented to person, place, and time. Coordination normal.  Skin: Skin is warm and dry. No rash noted. No erythema.  Psychiatric: She has a normal mood and affect. Her behavior is normal. Judgment and thought content normal.    Assessment and Plan

## 2013-04-10 NOTE — Assessment & Plan Note (Signed)
Etiology of her diarrhea is not clear. We have suggested she followup with her primary care physician. Possibly discuss this with GI if symptoms persist.

## 2013-04-10 NOTE — Patient Instructions (Addendum)
You are doing well. No medication changes were made.  Please take nitro under the tongue for chest pain as needed  Please call us if you have new issues that need to be addressed before your next appt.  Your physician wants you to follow-up in: 6 months.  You will receive a reminder letter in the mail two months in advance. If you don't receive a letter, please call our office to schedule the follow-up appointment.

## 2013-04-10 NOTE — Assessment & Plan Note (Signed)
No symptoms concerning for recurrent atrial fibrillation. Rare short episodes of SVT on Holter monitor in May 2014. No changes to her medications at this time.

## 2013-04-10 NOTE — Assessment & Plan Note (Signed)
Blood pressure is well controlled on today's visit. No changes made to the medications. 

## 2013-04-10 NOTE — Assessment & Plan Note (Signed)
Chest pain is atypical in nature. She had cardiac MRI several years ago showing normal cardiac pathology, no mention of underlying coronary artery disease. We have suggested she take nitroglycerin sublingual for repeat chest pain episodes. She has had chest pain episodes in the past. No significant risk factors. If she does have continued frequent episodes of chest pain, we have mention that additional testing could be done such as stress testing. Would have to do pharmacologic Myoview as she is unable to treadmill.

## 2013-05-29 ENCOUNTER — Other Ambulatory Visit: Payer: Self-pay | Admitting: Family Medicine

## 2014-01-12 ENCOUNTER — Encounter: Payer: Self-pay | Admitting: Podiatry

## 2014-01-12 ENCOUNTER — Ambulatory Visit (INDEPENDENT_AMBULATORY_CARE_PROVIDER_SITE_OTHER): Payer: Medicare Other | Admitting: Podiatry

## 2014-01-12 VITALS — BP 147/80 | HR 84 | Resp 12

## 2014-01-12 DIAGNOSIS — B351 Tinea unguium: Secondary | ICD-10-CM

## 2014-01-12 DIAGNOSIS — L259 Unspecified contact dermatitis, unspecified cause: Secondary | ICD-10-CM

## 2014-01-12 NOTE — Progress Notes (Signed)
Subjective:     Patient ID: Marnee SpringKaryn Hasten, female   DOB: Dec 09, 1925, 80 y.o.   MRN: [id number]  HPI patient presents stating these nails of my left foot have thickened and they itch at times and I cannot cut them   Review of Systems     Objective:   Physical Exam Neurovascular status intact with no other health history changes noted and well oriented x3 with thick nailbeds 1-5 left with yellow discoloration    Assessment:     Mycotic nail infection to nailbeds 1-5 left    Plan:     Debridement of nailbeds 1-5 left and discussed topical which she does not want at the current time

## 2014-01-12 NOTE — Progress Notes (Signed)
   Subjective:    Patient ID: Karen SpringKaryn Filippini, female    DOB: 03/06/1926, 80 y.o.   MRN: [id number]  HPIPT STATED LT FOOT GREAT TOENAIL ARE THICK AND HAVE DISCOLORATION FOR LONG TERM. THE TOENAIL ARE GETTING WORSE AND TRIED NO TREATMENT.    Review of Systems  All other systems reviewed and are negative.      Objective:   Physical Exam        Assessment & Plan:

## 2014-02-19 ENCOUNTER — Telehealth: Payer: Self-pay

## 2014-02-19 NOTE — Telephone Encounter (Signed)
Pt called states 2 nights ago she had a "heart attack" . I had pain in left chest, pain in my legs and various places, I just lay there quietly until it went away"

## 2014-02-19 NOTE — Telephone Encounter (Signed)
Spoke w/ pt.  She states that she had a "heart attack" 2 nights ago. Describes it as pain throughout her left breast and all of her extremities, that she lay still until it went away. Attempted to discuss w/ pt her sx and to describe the pain, but she repeats that she knows it was a heart attack and that her family encouraged her to call us.  Pt sched to see Dr. Mariah Milling 02/23/14 @ 10:45. Advised pt to call 911 if sx recur before that time.  She is agreeable to this.

## 2014-02-23 ENCOUNTER — Encounter: Payer: Self-pay | Admitting: Cardiovascular Disease

## 2014-02-23 ENCOUNTER — Ambulatory Visit (INDEPENDENT_AMBULATORY_CARE_PROVIDER_SITE_OTHER): Payer: Medicare Other | Admitting: Cardiovascular Disease

## 2014-02-23 VITALS — BP 142/80 | HR 71 | Ht 65.0 in | Wt 124.8 lb

## 2014-02-23 DIAGNOSIS — R197 Diarrhea, unspecified: Secondary | ICD-10-CM

## 2014-02-23 DIAGNOSIS — R079 Chest pain, unspecified: Secondary | ICD-10-CM

## 2014-02-23 DIAGNOSIS — I4891 Unspecified atrial fibrillation: Secondary | ICD-10-CM

## 2014-02-23 DIAGNOSIS — R5381 Other malaise: Secondary | ICD-10-CM

## 2014-02-23 DIAGNOSIS — R5383 Other fatigue: Secondary | ICD-10-CM

## 2014-02-23 DIAGNOSIS — I10 Essential (primary) hypertension: Secondary | ICD-10-CM

## 2014-02-23 MED ORDER — NITROGLYCERIN 0.4 MG SL SUBL: 0.4000 mg | SUBLINGUAL_TABLET | SUBLINGUAL | Status: DC | PRN

## 2014-02-23 NOTE — Assessment & Plan Note (Signed)
Maintaining normal sinus rhythm. No changes to her medications 

## 2014-02-23 NOTE — Progress Notes (Signed)
Patient ID: Karen Colon, female    DOB: [birth year], 80 y.o.   MRN: [id number]  HPI Comments: 80 y/o with h/o HTN, HL and PAF, memory problems, prior episodes of chest pain,  who presents for routine followup. Previous  Echo with normal LV function   cardiac MRI  was normal 2010 with no mention of coronary artery disease.  She currently lives in [place], independent living  In followup today, she reports that 4 days ago she developed left-sided chest pain while sleeping. It went throughout her chest, down into her legs. She denies any tachycardia or palpitations concerning for atrial fibrillation. Symptoms resolved after 5 or 10 minutes without intervention. Since then she's had no further episodes. She is otherwise relatively active, walks her dogs twice per day, 6 blocks on average for each drip. Gait is stable but slowing. Daughter worried about conditioning. She reports that her balance is not very good. She is trying to swim sometimes up to 15 minutes at a time.   She also complains about lethargy. This was a complaint on her prior clinic visit. No regular exercise program other than above. Difficulty getting out of a chair. Lethargy is in the morning, all day, evenings. Not associated with exertion  Reports that she sleeps well.   Previously had problems with chronic diarrhea Some medication confusion in the past. She's had depression In the past   Holter Monitor  In the past showing 3 brief runs of SVT (5 beats) no  AF.  This was done in May 2014   EKG shows normal sinus rhythm with a rate 71 beats per minute, no significant ST or T wave changes.          Outpatient Encounter Prescriptions as of 02/23/2014  Medication Sig  . levothyroxine (SYNTHROID, LEVOTHROID) 88 MCG tablet Take 1 tablet (88 mcg total) by mouth daily before breakfast. PATIENT NEEDS OFFICE VISIT FOR ADDITIONAL REFILLS  . Loperamide HCl (ANTI-DIARRHEAL PO) Take by mouth daily.  . mirtazapine (REMERON) 15 MG  tablet Take 15 mg by mouth at bedtime.   . nitroGLYCERIN (NITROSTAT) 0.4 MG SL tablet Place 1 tablet (0.4 mg total) under the tongue every 5 (five) minutes as needed for chest pain.  Marland Kitchen sertraline (ZOLOFT) 50 MG tablet Take 50 mg by mouth daily.     Review of Systems  Constitutional: Positive for fatigue.  HENT: Negative.   Eyes: Negative.   Respiratory: Negative.   Cardiovascular: Positive for chest pain.  Gastrointestinal: Positive for diarrhea.  Endocrine: Negative.   Musculoskeletal: Positive for gait problem.  Skin: Negative.   Allergic/Immunologic: Negative.   Neurological: Negative.   Hematological: Negative.   Psychiatric/Behavioral: Negative.   All other systems reviewed and are negative.   BP 142/80  Ht 5\' 5"  (1.651 m)  Wt 124 lb 12 oz (56.586 kg)  BMI 20.76 kg/m2  Physical Exam  Nursing note and vitals reviewed. Constitutional: She is oriented to person, place, and time. She appears well-developed and well-nourished.  HENT:  Head: Normocephalic.  Nose: Nose normal.  Mouth/Throat: Oropharynx is clear and moist.  Eyes: Conjunctivae are normal. Pupils are equal, round, and reactive to light.  Neck: Normal range of motion. Neck supple. No JVD present.  Cardiovascular: Normal rate, regular rhythm, S1 normal, S2 normal, normal heart sounds and intact distal pulses.  Exam reveals no gallop and no friction rub.   No murmur heard. Pulmonary/Chest: Effort normal and breath sounds normal. No respiratory distress. She has no wheezes. She  has no rales. She exhibits no tenderness.  Abdominal: Soft. Bowel sounds are normal. She exhibits no distension. There is no tenderness.  Musculoskeletal: Normal range of motion. She exhibits no edema and no tenderness.  Lymphadenopathy:    She has no cervical adenopathy.  Neurological: She is alert and oriented to person, place, and time. Coordination normal.  Skin: Skin is warm and dry. No rash noted. No erythema.  Psychiatric: She has a  normal mood and affect. Her behavior is normal. Judgment and thought content normal.    Assessment and Plan

## 2014-02-23 NOTE — Patient Instructions (Signed)
You are doing well. No medication changes were made.  Please call there office if you have more chest pain episodes  Please call us if you have new issues that need to be addressed before your next appt.  Your physician wants you to follow-up in: 12 months.  You will receive a reminder letter in the mail two months in advance. If you don't receive a letter, please call our office to schedule the follow-up appointment.

## 2014-02-23 NOTE — Assessment & Plan Note (Signed)
Recent episode of chest pain, atypical in nature. Otherwise walking her dog and active without recurrent symptoms. We have recommended that she call us that she has additional episodes. Chest pain radiating into her legs suggest something other than cardiac. No prior cardiac disease. If she has additional episodes, stress test could be performed. Also could perform Holter to exclude atrial fibrillation

## 2014-02-23 NOTE — Assessment & Plan Note (Signed)
Blood pressure is well controlled on today's visit. No changes made to the medications. 

## 2014-02-23 NOTE — Assessment & Plan Note (Signed)
Previous issues with chronic diarrhea. This was not discussed on today's visit

## 2014-02-23 NOTE — Assessment & Plan Note (Signed)
Chronic fatigue symptoms. Suspect deconditioning, unable to exclude mild depression. Recommended a regular exercise program

## 2014-03-06 ENCOUNTER — Emergency Department: Payer: Self-pay | Admitting: Emergency Medicine

## 2014-03-22 ENCOUNTER — Emergency Department: Payer: Self-pay | Admitting: Emergency Medicine

## 2015-02-23 ENCOUNTER — Ambulatory Visit (INDEPENDENT_AMBULATORY_CARE_PROVIDER_SITE_OTHER): Payer: Medicare Other | Admitting: Cardiovascular Disease

## 2015-02-23 ENCOUNTER — Encounter: Payer: Self-pay | Admitting: Cardiovascular Disease

## 2015-02-23 VITALS — BP 130/80 | HR 80 | Ht 66.0 in | Wt 122.2 lb

## 2015-02-23 DIAGNOSIS — I4891 Unspecified atrial fibrillation: Secondary | ICD-10-CM

## 2015-02-23 DIAGNOSIS — R5382 Chronic fatigue, unspecified: Secondary | ICD-10-CM

## 2015-02-23 DIAGNOSIS — R079 Chest pain, unspecified: Secondary | ICD-10-CM

## 2015-02-23 NOTE — Assessment & Plan Note (Signed)
Active, walking her dog without recurrent symptoms. We have recommended that she call us that she has additional episodes. No prior cardiac disease.

## 2015-02-23 NOTE — Patient Instructions (Signed)
You are doing well. No medication changes were made.  Please call us if you have new issues that need to be addressed before your next appt.  Your physician wants you to follow-up in: 12 months.  You will receive a reminder letter in the mail two months in advance. If you don't receive a letter, please call our office to schedule the follow-up appointment. 

## 2015-02-23 NOTE — Assessment & Plan Note (Signed)
Maintaining normal sinus rhythm. No changes to her medications 

## 2015-02-23 NOTE — Progress Notes (Signed)
Patient ID: Karen Colon, female    DOB: 06/04/1926, 81 y.o.   MRN: [id number]  HPI Comments: 81 y/o with h/o HTN, HL and PAF, memory problems, prior episodes of chest pain,  who presents for routine followup of her chest pain. Previous  Echo with normal LV function   cardiac MRI  was normal 2010 with no mention of coronary artery disease.  She currently lives in [place], independent living  In follow-up today, she denies any further chest pain symptoms. Overall she feels well, is active, walks her dog 4 times per day 4-5 blocks at a time. No lower extremity edema. Blood pressure well controlled at home Overall has no new complaints. Previously reported having lethargy. This continues to be an issue.  EKG on today's visit shows normal sinus rhythm with rate 80 bpm, no significant ST or T-wave changes  Previously had problems with chronic diarrhea. This is an occasional problem She's had depression In the past   Holter Monitor  In the past showing 3 brief runs of SVT (5 beats) no  AF.  This was done in May 2014         Allergies  Allergen Reactions  . Ciprofloxacin     Muscle spasm   . Iodinated Diagnostic Agents     Iodine DYe  . Other     Dust Mites    Current Outpatient Prescriptions on File Prior to Visit  Medication Sig Dispense Refill  . levothyroxine (SYNTHROID, LEVOTHROID) 88 MCG tablet Take 1 tablet (88 mcg total) by mouth daily before breakfast. PATIENT NEEDS OFFICE VISIT FOR ADDITIONAL REFILLS 30 tablet 0  . mirtazapine (REMERON) 15 MG tablet Take 15 mg by mouth at bedtime.     . nitroGLYCERIN (NITROSTAT) 0.4 MG SL tablet Place 1 tablet (0.4 mg total) under the tongue every 5 (five) minutes as needed for chest pain. 25 tablet 3   No current facility-administered medications on file prior to visit.    Past Medical History  Diagnosis Date  . Allergic rhinitis   . Hypothyroidism   . Hypertension   . Hyperlipidemia   . Osteopenia   . Paroxysmal a-fib 2009     [facility]  . History of MRI 9/10    Cardiac MRI, normal  . Diarrhea   . Teeth grinding   . allergy historyh status   . Allergy history unknown   . Hearing loss     bilateral hearing aides  . Unspecified vitamin D deficiency   . Insomnia, unspecified   . Depressive disorder, not elsewhere classified   . Measles   . Chicken pox   . Mumps   . Dizziness     Past Surgical History  Procedure Laterality Date  . Tonsillectomy    . Appendectomy  2009    Social History  reports that she has never smoked. She does not have any smokeless tobacco history on file. She reports that she drinks alcohol. She reports that she does not use illicit drugs.  Family History family history includes Heart disease in her mother; Pneumonia in her father.   Review of Systems  Constitutional: Positive for fatigue.  Respiratory: Negative.   Cardiovascular: Negative.   Gastrointestinal: Positive for diarrhea.  Musculoskeletal: Negative.   Skin: Negative.   Neurological: Negative.   Hematological: Negative.   Psychiatric/Behavioral: Negative.   All other systems reviewed and are negative.   BP 130/80 mmHg  Pulse 80  Ht  (1.676 m)  Wt 122 lb 4  oz (55.452 kg)  BMI 19.74 kg/m2  Physical Exam  Constitutional: She is oriented to person, place, and time. She appears well-developed and well-nourished.  HENT:  Head: Normocephalic.  Nose: Nose normal.  Mouth/Throat: Oropharynx is clear and moist.  Eyes: Conjunctivae are normal. Pupils are equal, round, and reactive to light.  Neck: Normal range of motion. Neck supple. No JVD present.  Cardiovascular: Normal rate, regular rhythm, S1 normal, S2 normal, normal heart sounds and intact distal pulses.  Exam reveals no gallop and no friction rub.   No murmur heard. Pulmonary/Chest: Effort normal and breath sounds normal. No respiratory distress. She has no wheezes. She has no rales. She exhibits no tenderness.  Abdominal: Soft. Bowel sounds are  normal. She exhibits no distension. There is no tenderness.  Musculoskeletal: Normal range of motion. She exhibits no edema or tenderness.  Lymphadenopathy:    She has no cervical adenopathy.  Neurological: She is alert and oriented to person, place, and time. Coordination normal.  Skin: Skin is warm and dry. No rash noted. No erythema.  Psychiatric: She has a normal mood and affect. Her behavior is normal. Judgment and thought content normal.    Assessment and Plan  Nursing note and vitals reviewed.

## 2015-02-23 NOTE — Assessment & Plan Note (Signed)
Chronic issue. Recommended she continue her exercise program, walking her dog

## 2015-06-14 ENCOUNTER — Telehealth: Payer: Self-pay | Admitting: *Deleted

## 2015-06-14 NOTE — Telephone Encounter (Signed)
Pt calling stating that she is having chest pain every once in a while.  Been going on for years. Going for hearing for heart transplant but not sure if she needs to go with these chest pains. Not taking any medication not sure if she should be. Denies SOB, Dizzy spells. Not having CP right at the moment, states they come and go. She would like to know what Dr Mariah Milling thinks, with a possible transplant coming up should she be doing this.  Please advise.

## 2015-06-14 NOTE — Telephone Encounter (Signed)
Pt is coming 06/20/15 at 11am to see Dr Mariah Milling

## 2015-06-14 NOTE — Telephone Encounter (Signed)
Does Dr. Mariah Milling have any openings we can put her in?

## 2015-06-20 ENCOUNTER — Encounter: Payer: Self-pay | Admitting: *Deleted

## 2015-06-20 ENCOUNTER — Ambulatory Visit: Payer: Medicare Other | Admitting: Cardiovascular Disease

## 2016-07-09 ENCOUNTER — Ambulatory Visit: Payer: Medicare Other | Admitting: Cardiovascular Disease

## 2016-07-27 ENCOUNTER — Ambulatory Visit: Payer: Medicare Other | Admitting: Cardiovascular Disease

## 2016-12-10 NOTE — Progress Notes (Signed)
Cardiology Office Note  Date:  12/11/2016   ID:  Karen SpringKaryn Christley, DOB 07-22-1926, MRN [id number]  PCP:  Dortha KernLaura K Bliss, MD   Chief Complaint  Patient presents with  . other    Over due 12 month follow up. Patient denies chest pain. Over all doing well.  Meds reviewed verbally with patient.     HPI:  83 y/o with h/o HTN, HL and PAF, memory problems, prior episodes of chest pain,  who presents for routine followup of her chest pain. Previous  Echo with normal LV function   cardiac MRI  was normal 2010 with no mention of coronary artery disease.  She currently lives in [place], independent living  In follow-up today she presents with caretaker from [facility] [place] Having memory issues Denies ever having diarrhea episodes, not an active issue it would seem Denies having any chest pain or other issues, denies shortness of breath Some gait instability but no recent falls Has not seen primary care in 2.5 years No recent lab work for review Unable to have her dog at [facility] [place], makes her sad. Used to walk with her dog No lower extremity edema.   EKG on today's visit shows normal sinus rhythm with rate 84 bpm, no significant ST or T-wave changes  Previously had problems with chronic diarrhea. This is an occasional problem She's had depression In the past   Holter Monitor  In the past showing 3 brief runs of SVT (5 beats) no  AF.  This was done in May 2014      PMH:   has a past medical history of Allergic rhinitis; Allergy history unknown; allergy historyh status; Chicken pox; Depressive disorder, not elsewhere classified; Diarrhea; Dizziness; Hearing loss; History of MRI (9/10); Hyperlipidemia; Hypertension; Hypothyroidism; Insomnia, unspecified; Measles; Mumps; Osteopenia; Paroxysmal a-fib (HCC) (2009); Teeth grinding; and Unspecified vitamin D deficiency.  PSH:    Past Surgical History:  Procedure Laterality Date  . APPENDECTOMY  2009  . TONSILLECTOMY      Current Outpatient  Prescriptions  Medication Sig Dispense Refill  . levothyroxine (SYNTHROID, LEVOTHROID) 88 MCG tablet Take 1 tablet (88 mcg total) by mouth daily before breakfast. PATIENT NEEDS OFFICE VISIT FOR ADDITIONAL REFILLS 30 tablet 0  . mirtazapine (REMERON) 15 MG tablet Take 15 mg by mouth at bedtime.     . nitroGLYCERIN (NITROSTAT) 0.4 MG SL tablet Place 1 tablet (0.4 mg total) under the tongue every 5 (five) minutes as needed for chest pain. 25 tablet 3  . Vitamin D, Cholecalciferol, 400 UNITS CAPS Take by mouth daily.      No current facility-administered medications for this visit.      Allergies:   Ciprofloxacin; Iodinated diagnostic agents; and Other   Social History:  The patient  reports that she has never smoked. She has never used smokeless tobacco. She reports that she drinks alcohol. She reports that she does not use drugs.   Family History:   family history includes Heart disease in her mother; Pneumonia in her father.    Review of Systems: Review of Systems  Constitutional: Negative.   Respiratory: Negative.   Cardiovascular: Negative.   Gastrointestinal: Negative.   Musculoskeletal: Negative.   Neurological: Negative.   Psychiatric/Behavioral: Negative.   All other systems reviewed and are negative.    PHYSICAL EXAM: VS:  BP 130/66 (BP Location: Left Arm, Patient Position: Sitting, Cuff Size: Normal)   Pulse 84   Ht 5\' 6"  (1.676 m)   Wt 127 lb 8  oz (57.8 kg)   BMI 20.58 kg/m  , BMI Body mass index is 20.58 kg/m. GEN: Well nourished, well developed, in no acute distress  HEENT: normal  Neck: no JVD, carotid bruits, or masses Cardiac: RRR; no murmurs, rubs, or gallops,no edema  Respiratory:  clear to auscultation bilaterally, normal work of breathing GI: soft, nontender, nondistended, + BS MS: no deformity or atrophy  Skin: warm and dry, no rash Neuro:  Strength and sensation are intact Psych: euthymic mood, full affect, Memory issues    Recent Labs: No  results found for requested labs within last 8760 hours.    Lipid Panel No results found for: CHOL, HDL, LDLCALC, TRIG    Wt Readings from Last 3 Encounters:  12/11/16 127 lb 8 oz (57.8 kg)  02/23/15 122 lb 4 oz (55.5 kg)  02/23/14 124 lb 12 oz (56.6 kg)       ASSESSMENT AND PLAN:  HYPERTENSION, BENIGN - Plan: EKG 12-Lead Blood pressure is well controlled on today's visit. Currently not on any medications Weight is stable  Chest pain, unspecified type - Plan: EKG 12-Lead Denies any significant chest pain, active at baseline with no symptoms No further testing needed Previous workup showing no coronary disease, Suspect Previous chest pain was atypical in nature  Memory change - Plan: EKG 12-Lead Poor historian, denies any problems, does not remember ever having diarrhea issues Does not remember who her primary care has been in the past, does not remember ever seeing Dr. Burnadette Pop   Paroxysmal atrial fibrillation [facility]) - Plan: EKG 12-Lead Long discussion with her, we will review previous records Unable to identify when she had paroxysmal atrial fibrillation   Total encounter time more than 25 minutes  Greater than 50% was spent in counseling and coordination of care with the patient   Disposition:   F/U  12 months As needed   Orders Placed This Encounter  Procedures  . EKG 12-Lead     Signed, Dossie Arbour, M.D., Ph.D. 12/11/2016  [facility] [place], [place] [phone]

## 2016-12-11 ENCOUNTER — Ambulatory Visit (INDEPENDENT_AMBULATORY_CARE_PROVIDER_SITE_OTHER): Payer: Medicare HMO | Admitting: Cardiovascular Disease

## 2016-12-11 ENCOUNTER — Encounter: Payer: Self-pay | Admitting: Cardiovascular Disease

## 2016-12-11 VITALS — BP 130/66 | HR 84 | Ht 66.0 in | Wt 127.5 lb

## 2016-12-11 DIAGNOSIS — R079 Chest pain, unspecified: Secondary | ICD-10-CM | POA: Diagnosis not present

## 2016-12-11 DIAGNOSIS — R413 Other amnesia: Secondary | ICD-10-CM

## 2016-12-11 DIAGNOSIS — I480 Paroxysmal atrial fibrillation: Secondary | ICD-10-CM

## 2016-12-11 DIAGNOSIS — I10 Essential (primary) hypertension: Secondary | ICD-10-CM | POA: Diagnosis not present

## 2016-12-11 NOTE — Patient Instructions (Signed)

## 2017-02-05 ENCOUNTER — Telehealth: Payer: Self-pay

## 2017-02-05 NOTE — Telephone Encounter (Signed)
Completed rec.

## 2017-02-07 ENCOUNTER — Encounter: Payer: Self-pay | Admitting: Internal Medicine

## 2017-02-07 ENCOUNTER — Non-Acute Institutional Stay: Payer: Medicare HMO | Admitting: Internal Medicine

## 2017-02-07 VITALS — BP 122/64 | HR 78 | Temp 97.7°F | Resp 20 | Wt 127.0 lb

## 2017-02-07 DIAGNOSIS — J301 Allergic rhinitis due to pollen: Secondary | ICD-10-CM

## 2017-02-07 DIAGNOSIS — G3184 Mild cognitive impairment, so stated: Secondary | ICD-10-CM

## 2017-02-07 DIAGNOSIS — E039 Hypothyroidism, unspecified: Secondary | ICD-10-CM | POA: Diagnosis not present

## 2017-02-07 DIAGNOSIS — I480 Paroxysmal atrial fibrillation: Secondary | ICD-10-CM

## 2017-02-07 DIAGNOSIS — I10 Essential (primary) hypertension: Secondary | ICD-10-CM

## 2017-02-07 DIAGNOSIS — F39 Unspecified mood [affective] disorder: Secondary | ICD-10-CM | POA: Insufficient documentation

## 2017-02-07 MED ORDER — TRAZODONE HCL 50 MG PO TABS: 50.0000 mg | ORAL_TABLET | Freq: Every day | ORAL | 11 refills | Status: DC

## 2017-02-07 NOTE — Assessment & Plan Note (Addendum)
Mostly related to sleep problems and likely some degree of adjustment (though she likes it here). Will stop the alprazolam--not helping Try trazodone and titrate Was on mirtazapine at some time in past  Extended conversation with daughter Dewayne Hatchnn

## 2017-02-07 NOTE — Assessment & Plan Note (Signed)
Seems euthyroid ?Will check labs ?

## 2017-02-07 NOTE — Assessment & Plan Note (Signed)
Mild symptoms Will start loratadine prn

## 2017-02-07 NOTE — Assessment & Plan Note (Signed)
Apparent brief paroxysm years ago No evidence of significant recurrence so no anticoagulation

## 2017-02-07 NOTE — Assessment & Plan Note (Signed)
Sounds like it has gone on for some time No dementia and independent function (though now in [place] for meals, etc)

## 2017-02-07 NOTE — Progress Notes (Signed)
Subjective:    Patient ID: Karen SpringKaryn Colon, female    DOB: [birth year], 83 y.o.   MRN: [id number]  HPI [facility] Reviewed status with RN here  [facility] [place]oved [place] recently at the urging of their daughter Now having sleep problems since moving here---bad enough to want medications Getting alprazolam but not helping Not generally anxious other than the sleep problems Some depression with this---but no prior mood issues  History of atrial fibrillation--?2009 No recent spells Keeps up with Dr Georgiann HahnGollan--no treatment since felt to have low burden  Mild memory problems Mostly just trouble with names No chest pain No palpitations No edema No dizziness or syncope  Known hypothyroidism Goes back decades Weight stable No change in skin, hair or nails  Current Outpatient Prescriptions on File Prior to Visit  Medication Sig Dispense Refill  . ALPRAZolam (XANAX) 0.25 MG tablet Take 0.25 mg by mouth at bedtime. At 9pm    . thyroid (ARMOUR) 90 MG tablet Take 90 mg by mouth daily. At 7am     No current facility-administered medications on file prior to visit.     Allergies  Allergen Reactions  . Ciprofloxacin     Muscle spasm   . Iodinated Diagnostic Agents     Iodine DYe  . Other     Dust Mites    Past Medical History:  Diagnosis Date  . Allergic rhinitis   . Depressive disorder, not elsewhere classified   . Hearing loss    bilateral hearing aides  . History of MRI 9/10   Cardiac MRI, normal  . Hyperlipidemia   . Hypertension   . Hypothyroidism   . Insomnia, unspecified   . Osteopenia   . Paroxysmal A-fib (HCC) 2009   [facility]  . Teeth grinding   . Unspecified vitamin D deficiency     Past Surgical History:  Procedure Laterality Date  . APPENDECTOMY  2009  . TONSILLECTOMY      Family History  Problem Relation Age of Onset  . Heart disease Mother   . Pneumonia Father     Social  History   Social History  . Marital status: Married    Spouse name: N/A  . Number of children: 2  . Years of education: N/A   Occupational History  . High school teacher     Retired   Social History Main Topics  . Smoking status: Never Smoker  . Smokeless tobacco: Never Used  . Alcohol use 0.6 oz/week    1 Glasses of wine per week     Comment: 1 glass of wine per night  . Drug use: No  . Sexual activity: Not on file   Other Topics Concern  . Not on file   Social History Narrative   Regular exercise: Yes Light 5-10 minutes; walking dog twice a day.Married; x 59 years moderately happy;+verbal abuse;denies physical abuse.Smoke alarm in the home. No guns in the home.Caffeine use moderate, Always uses seat belts. Activities: +driving,cleans house,+grocery shopping,washes clothes,performs ADL's.   Review of Systems  Constitutional: Negative for fatigue and unexpected weight change.  HENT: Positive for hearing loss. Negative for dental problem.   Eyes: Negative for visual disturbance.       No diplopia or unilateral vision loss  Respiratory: Negative for cough, chest tightness and shortness of breath.   Cardiovascular: Negative for chest pain, palpitations and leg swelling.  Gastrointestinal: Negative for abdominal pain, blood  in stool, constipation and nausea.       No heartburn  Endocrine: Negative for polydipsia and polyuria.  Genitourinary: Positive for pelvic pain. Negative for difficulty urinating, dysuria, hematuria and urgency.  Musculoskeletal: Negative for arthralgias, back pain and joint swelling.  Skin: Negative for rash.  Allergic/Immunologic: Positive for environmental allergies. Negative for immunocompromised state.  Neurological: Negative for dizziness, syncope, light-headedness and headaches.  Hematological: Negative for adenopathy. Does not bruise/bleed easily.  Psychiatric/Behavioral: Positive for dysphoric mood and sleep disturbance. The patient is  nervous/anxious.        Objective:   Physical Exam  Constitutional: She is oriented to person, place, and time. She appears well-nourished. No distress.  HENT:  Mouth/Throat: Oropharynx is clear and moist. No oropharyngeal exudate.  Neck: No thyromegaly present.  Cardiovascular: Normal rate, regular rhythm and normal heart sounds.  Exam reveals no gallop.   No murmur heard. Rare skip Faint pedal pulses  Pulmonary/Chest: Effort normal and breath sounds normal. No respiratory distress. She has no wheezes. She has no rales.  Abdominal: Soft. There is no tenderness.  Musculoskeletal: She exhibits no edema or tenderness.  Lymphadenopathy:    She has no cervical adenopathy.  Neurological: She is alert and oriented to person, place, and time.  President --- "Trump, ?" [id number]  Mild recall issues  Skin: No rash noted. No erythema.  Psychiatric: She has a normal mood and affect. Her behavior is normal.          Assessment & Plan:

## 2017-02-07 NOTE — Assessment & Plan Note (Signed)
BP Readings from Last 3 Encounters:  02/07/17 122/64  12/11/16 130/66  02/23/15 130/80   Good control No changes needed

## 2017-02-21 ENCOUNTER — Telehealth: Payer: Self-pay

## 2017-02-21 NOTE — Telephone Encounter (Signed)
PLEASE NOTE: All timestamps contained within this report are represented as [place] Time. CONFIDENTIALTY NOTICE: This fax transmission is intended only for the addressee. It contains information that is legally privileged, confidential or otherwise protected from use or disclosure. If you are not the intended recipient, you are strictly prohibited from reviewing, disclosing, copying using or disseminating any of this information or taking any action in reliance on or regarding this information. If you have received this fax in error, please notify us immediately by telephone so that we can arrange for its return to us. Phone: [phone], Toll-Free: [phone], Fax: [phone] Page: 1 of 1 Call Id: [id number] [facility] Client TELEPHONE ADVICE RECORD [facility] Patient Name: Karen SpringKARYN Rohrer Gender: Female DOB: July 10, 1926 Age: 83 Y 4 D Return Phone Number: [phone] (Primary) City/State/Zip: [place] Client [facility] - Night Physician Tillman AbideLetvak, Richard - MD Who Is Calling Patient / Member / Family / Caregiver Call Type Triage / Clinical Caller Name Stark KleinKaryn Relationship To Patient Self Return Phone Number [phone] (Primary) Chief Complaint Nasal Allergies (Hay Fever) Reason for Call Symptomatic / Request for Health Information Initial Comment Sick with allergies from Pollen - Wants something called in. No Triage Reason Other Nurse Assessment Nurse: Earlene PlaterWallace, RN, Lupita Leashonna Date/Time Lamount Cohen(Eastern Time): 02/20/2017 4:36:17 PM Select reason for no triage. ---Other Please document clinical information provided and list any resource used. ---Error Guidelines Guideline Title Affirmed Question Disp. Time Lamount Cohen(Eastern Time) Disposition Final User 02/20/2017 4:56:47 PM FINAL ATTEMPT MADE - message left Yes Earlene PlaterWallace, RN, Lupita Leashonna

## 2017-02-21 NOTE — Telephone Encounter (Signed)
okay

## 2017-02-21 NOTE — Telephone Encounter (Signed)
Ann POA called back and said pt has dementia and would not remember my phone call. Armanda HeritageAdvised Ann of conversation with Mrs Gayla DossJoyner and Dewayne Hatchnn said [facility] already gotten flonase and Claritin for pt. Nothing further needed. FYI to Dr Alphonsus SiasLetvak.

## 2017-02-21 NOTE — Telephone Encounter (Signed)
I spoke with pt and advised when pt established with Dr Alphonsus SiasLetvak 02/07/17 he recommended loratadine, generic claritin as needed for allergies. Pt did not remember that but will get med at pharmacy. Pt will cb if needed. FYI to Dr Alphonsus SiasLetvak.

## 2017-02-22 ENCOUNTER — Telehealth: Payer: Self-pay

## 2017-02-22 NOTE — Telephone Encounter (Signed)
FYI  Patient calls to request "something be done about her allergies, they are severe".  Symptom is primarily a dry, persistent, hacky cough.    Upon my return call, I realized that [facility]  They currently are managing her medications.  I called and spoke with Emeline DarlingAudrey Lawton, RN facility nurse.  She states that patient just started on Flonase and Claritin in past 2 days and is receiving Tylenol and Tussin DM PRN.   Magda Paganiniudrey will continue to assist patient with this medication regimen and monitor her symptoms over the next 3-5 days.  If no improvement or gets worse she will follow up with Pamala Hurry. Baity, NP.    This Clinical research associatewriter spoke back with patient and gave her all information and provided reassurance that she is on the right treatments.  Patient thanked me for the information.

## 2017-02-22 NOTE — Telephone Encounter (Signed)
noted 

## 2017-03-06 ENCOUNTER — Encounter: Payer: Self-pay | Admitting: Internal Medicine

## 2017-03-06 ENCOUNTER — Non-Acute Institutional Stay: Payer: Medicare HMO | Admitting: Internal Medicine

## 2017-03-06 VITALS — BP 128/70 | HR 80 | Resp 18

## 2017-03-06 DIAGNOSIS — R05 Cough: Secondary | ICD-10-CM | POA: Diagnosis not present

## 2017-03-06 DIAGNOSIS — R0982 Postnasal drip: Secondary | ICD-10-CM

## 2017-03-06 DIAGNOSIS — Z9109 Other allergy status, other than to drugs and biological substances: Secondary | ICD-10-CM | POA: Diagnosis not present

## 2017-03-06 DIAGNOSIS — R059 Cough, unspecified: Secondary | ICD-10-CM

## 2017-03-06 NOTE — Progress Notes (Signed)
Subjective:    Patient ID: Karen SpringKaryn Habig, female    DOB: Apr 09, 1926, 83 y.o.   MRN: [id number]  HPI  Asked to see resident in [address] Runny nose, scratchy throat and cough x 2 week Clear mucous, no fevers, no SOB Has been taking Claritin, Flonase and Robitussin with some relief Concerned about persistent cough, nonproductive Seems better today  Review of Systems      Past Medical History:  Diagnosis Date  . Allergic rhinitis   . Depressive disorder, not elsewhere classified   . Hearing loss    bilateral hearing aides  . History of MRI 9/10   Cardiac MRI, normal  . Hyperlipidemia   . Hypertension   . Hypothyroidism   . Insomnia, unspecified   . Osteopenia   . Paroxysmal A-fib (HCC) 2009   [facility]  . Teeth grinding   . Unspecified vitamin D deficiency     Current Outpatient Prescriptions  Medication Sig Dispense Refill  . thyroid (ARMOUR) 90 MG tablet Take 90 mg by mouth daily. At 7am    . traZODone (DESYREL) 50 MG tablet Take 1-2 tablets (50-100 mg total) by mouth at bedtime. 60 tablet 11   No current facility-administered medications for this visit.     Allergies  Allergen Reactions  . Ciprofloxacin     Muscle spasm   . Iodinated Diagnostic Agents     Iodine DYe  . Other     Dust Mites    Family History  Problem Relation Age of Onset  . Heart disease Mother   . Pneumonia Father     Social History   Social History  . Marital status: Married    Spouse name: N/A  . Number of children: 2  . Years of education: N/A   Occupational History  . High school teacher     Retired   Social History Main Topics  . Smoking status: Never Smoker  . Smokeless tobacco: Never Used  . Alcohol use 0.6 oz/week    1 Glasses of wine per week     Comment: 1 glass of wine per night  . Drug use: No  . Sexual activity: Not on file   Other Topics Concern  . Not on file   Social History Narrative   Son and daughter      Has living will   Daughter is health care  POA   Has DNR ---confirmed and rewritten 02/07/17   No tube    [employer]     Constitutional: Denies fever, malaise, fatigue, headache or abrupt weight changes.  HEENT: Pt reports runny nose, sore throat. Denies eye pain, eye redness, ear pain, ringing in the ears, wax buildup, nasal congestion, bloody nose. Respiratory: Pt reports cough. Denies difficulty breathing, shortness of breath, or sputum production.     No other specific complaints in a complete review of systems (except as listed in HPI above).  Objective:   Physical Exam   BP 128/70   Pulse 80   Resp 18  Wt Readings from Last 3 Encounters:  02/07/17 127 lb (57.6 kg)  12/11/16 127 lb 8 oz (57.8 kg)  02/23/15 122 lb 4 oz (55.5 kg)    General: Appears her stated age, in NAD. HEENT: Throat/Mouth: Teeth present, mucosa pink and moist, + PND, no exudate, lesions or ulcerations noted.  Neck:  No adenopathy noted. Pulmonary/Chest: Normal effort and positive vesicular breath sounds. No respiratory distress. No wheezes, rales or ronchi noted.  :  BMET  Component Value Date/Time   NA 142 06/09/2012 1346   NA 144 11/10/2011 1018   K 4.3 06/09/2012 1346   K 4.1 11/10/2011 1018   CL 105 06/09/2012 1346   CL 107 11/10/2011 1018   CO2 26 06/09/2012 1346   CO2 26 11/10/2011 1018   GLUCOSE 87 06/09/2012 1346   GLUCOSE 74 11/10/2011 1018   BUN 18 06/09/2012 1346   BUN 16 11/10/2011 1018   CREATININE 0.58 06/09/2012 1346   CALCIUM 9.3 06/09/2012 1346   CALCIUM 8.7 11/10/2011 1018   GFRNONAA >60 11/10/2011 1018   GFRAA >60 11/10/2011 1018    Lipid Panel  No results found for: CHOL, TRIG, HDL, CHOLHDL, VLDL, LDLCALC  CBC    Component Value Date/Time   WBC 8.4 06/09/2012 1418   RBC 4.22 06/09/2012 1418   HGB 12.3 06/09/2012 1418   HGB 12.6 11/10/2011 1018   HCT 37.0 06/09/2012 1418   HCT 37.9 11/10/2011 1018   PLT 354 06/09/2012 1418   PLT 271 11/10/2011 1018   MCV 87.7 06/09/2012 1418   MCV 90  11/10/2011 1018   MCH 29.1 06/09/2012 1418   MCHC 33.2 06/09/2012 1418   RDW 14.6 06/09/2012 1418   RDW 14.7 (H) 11/10/2011 1018   LYMPHSABS 1.7 06/09/2012 1418   MONOABS 0.4 06/09/2012 1418   EOSABS 0.2 06/09/2012 1418   BASOSABS 0.0 06/09/2012 1418    Hgb A1C No results found for: HGBA1C         Assessment & Plan:   Cough, secondary to PND/Allergies:  Continue Claritn and Flonase RN request switching Robitussin to Delsym- ordered  Will reassess as needed Nicki Reaper, NP

## 2017-03-06 NOTE — Patient Instructions (Signed)

## 2017-03-20 ENCOUNTER — Non-Acute Institutional Stay: Payer: Medicare HMO | Admitting: Internal Medicine

## 2017-03-20 VITALS — BP 120/68 | HR 80 | Resp 18

## 2017-03-20 DIAGNOSIS — F329 Major depressive disorder, single episode, unspecified: Secondary | ICD-10-CM | POA: Diagnosis not present

## 2017-03-20 DIAGNOSIS — F5101 Primary insomnia: Secondary | ICD-10-CM | POA: Diagnosis not present

## 2017-03-20 DIAGNOSIS — J301 Allergic rhinitis due to pollen: Secondary | ICD-10-CM | POA: Diagnosis not present

## 2017-03-20 DIAGNOSIS — R5383 Other fatigue: Secondary | ICD-10-CM | POA: Diagnosis not present

## 2017-03-20 DIAGNOSIS — R0602 Shortness of breath: Secondary | ICD-10-CM

## 2017-03-21 ENCOUNTER — Encounter: Payer: Self-pay | Admitting: Internal Medicine

## 2017-03-21 MED ORDER — ALBUTEROL SULFATE HFA 108 (90 BASE) MCG/ACT IN AERS: 2.0000 | INHALATION_SPRAY | Freq: Four times a day (QID) | RESPIRATORY_TRACT | 0 refills | Status: DC | PRN

## 2017-03-21 MED ORDER — ESCITALOPRAM OXALATE 5 MG PO TABS: 5.0000 mg | ORAL_TABLET | Freq: Every day | ORAL | 2 refills | Status: DC

## 2017-03-21 NOTE — Progress Notes (Signed)
Subjective:    Patient ID: Marnee SpringKaryn Pfohl, female    DOB: Jan 02, 1926, 83 y.o.   MRN: [id number]  HPI  Asked to see resident in ALF with multiple complaints.  1- Resident had a bout with allergies a few weeks ago. This has resolved. Resident is wanting to d/c any allergy meds she is currently still taking.  2- She feels slightly down. She is still trying to get used to ALF. Her husband, who has dementia, irritates her from time to time because he is so clingy. [facility] which gives her relief. She thinks she might have some mild depression and would like to be started on a low dose mild antidepressant.  3- She is having trouble falling asleep. She was started on Trazadone in May. The instructions were to take 1 tab daily. If no improvement go up to 100 mg daily. She is still currently taking 50 mg QHS without much relief.   4- She has intermittent shortness of breath. This is not new for her. She used to have a RX for an inhaler and is requesting it be refilled today. Her daughter reports it was Albuterol.  5- She is concerned her B12 is low. She would like to restart B12 injections.  Review of Systems      Past Medical History:  Diagnosis Date  . Allergic rhinitis   . Depressive disorder, not elsewhere classified   . Hearing loss    bilateral hearing aides  . History of MRI 9/10   Cardiac MRI, normal  . Hyperlipidemia   . Hypertension   . Hypothyroidism   . Insomnia, unspecified   . Osteopenia   . Paroxysmal A-fib (HCC) 2009   [facility]  . Teeth grinding   . Unspecified vitamin D deficiency     Current Outpatient Prescriptions  Medication Sig Dispense Refill  . thyroid (ARMOUR) 90 MG tablet Take 90 mg by mouth daily. At 7am    . traZODone (DESYREL) 50 MG tablet Take 1-2 tablets (50-100 mg total) by mouth at bedtime. 60 tablet 11   No current facility-administered medications for this visit.     Allergies  Allergen Reactions  . Ciprofloxacin    Muscle spasm   . Iodinated Diagnostic Agents     Iodine DYe  . Other     Dust Mites    Family History  Problem Relation Age of Onset  . Heart disease Mother   . Pneumonia Father     Social History   Social History  . Marital status: Married    Spouse name: N/A  . Number of children: 2  . Years of education: N/A   Occupational History  . High school teacher     Retired   Social History Main Topics  . Smoking status: Never Smoker  . Smokeless tobacco: Never Used  . Alcohol use 0.6 oz/week    1 Glasses of wine per week     Comment: 1 glass of wine per night  . Drug use: No  . Sexual activity: Not on file   Other Topics Concern  . Not on file   Social History Narrative   Son and daughter      Has living will   Daughter is health care POA   Has DNR ---confirmed and rewritten 02/07/17   No tube    [employer]     Constitutional: Pt reports fatigue. Denies fever, malaise, headache or abrupt weight changes.  Respiratory:  Pt reports intermittent shortness of breath. Denies difficulty breathing, cough or sputum production.   Cardiovascular: Denies chest pain, chest tightness, palpitations or swelling in the hands or feet.  Neurological: Pt reports insomnia. Denies dizziness, difficulty with memory, difficulty with speech or problems with balance and coordination.  Psych: Pt reports depression. Denies anxiety, SI/HI.  No other specific complaints in a complete review of systems (except as listed in HPI above).  Objective:   Physical Exam  BP 120/68   Pulse 80   Resp 18  Wt Readings from Last 3 Encounters:  02/07/17 127 lb (57.6 kg)  12/11/16 127 lb 8 oz (57.8 kg)  02/23/15 122 lb 4 oz (55.5 kg)    General: Appears her stated age, in NAD. Pulmonary/Chest: Normal effort and positive vesicular breath sounds. No respiratory distress. No wheezes, rales or ronchi noted.  Neurological: Alert and oriented.  Psychiatric: Mildly flat affect. Judgment and thought  content normal.    BMET    Component Value Date/Time   NA 142 06/09/2012 1346   NA 144 11/10/2011 1018   K 4.3 06/09/2012 1346   K 4.1 11/10/2011 1018   CL 105 06/09/2012 1346   CL 107 11/10/2011 1018   CO2 26 06/09/2012 1346   CO2 26 11/10/2011 1018   GLUCOSE 87 06/09/2012 1346   GLUCOSE 74 11/10/2011 1018   BUN 18 06/09/2012 1346   BUN 16 11/10/2011 1018   CREATININE 0.58 06/09/2012 1346   CALCIUM 9.3 06/09/2012 1346   CALCIUM 8.7 11/10/2011 1018   GFRNONAA >60 11/10/2011 1018   GFRAA >60 11/10/2011 1018    Lipid Panel  No results found for: CHOL, TRIG, HDL, CHOLHDL, VLDL, LDLCALC  CBC    Component Value Date/Time   WBC 8.4 06/09/2012 1418   RBC 4.22 06/09/2012 1418   HGB 12.3 06/09/2012 1418   HGB 12.6 11/10/2011 1018   HCT 37.0 06/09/2012 1418   HCT 37.9 11/10/2011 1018   PLT 354 06/09/2012 1418   PLT 271 11/10/2011 1018   MCV 87.7 06/09/2012 1418   MCV 90 11/10/2011 1018   MCH 29.1 06/09/2012 1418   MCHC 33.2 06/09/2012 1418   RDW 14.6 06/09/2012 1418   RDW 14.7 (H) 11/10/2011 1018   LYMPHSABS 1.7 06/09/2012 1418   MONOABS 0.4 06/09/2012 1418   EOSABS 0.2 06/09/2012 1418   BASOSABS 0.0 06/09/2012 1418    Hgb A1C No results found for: HGBA1C          Assessment & Plan:    Fatigue:  ? R/t insomnia Will check B12 level before starting injections  Insomnia:  Increase Trazadone to 100 mg QHS according to prior order  Seasonal Allergies:  D/C antihistamine and Flonase  Depression:  Support offered today Start Lexapro 5 mg daily  Shortness of Breath:  Albuterol inhaler refilled today  Will reassess as needed Nicki Reaper, NP

## 2017-03-21 NOTE — Patient Instructions (Signed)

## 2017-05-22 ENCOUNTER — Non-Acute Institutional Stay: Payer: Medicare HMO | Admitting: Internal Medicine

## 2017-05-22 ENCOUNTER — Encounter: Payer: Self-pay | Admitting: Internal Medicine

## 2017-05-22 VITALS — BP 134/82 | HR 78 | Wt 125.0 lb

## 2017-05-22 DIAGNOSIS — E039 Hypothyroidism, unspecified: Secondary | ICD-10-CM

## 2017-05-22 DIAGNOSIS — F4321 Adjustment disorder with depressed mood: Secondary | ICD-10-CM

## 2017-05-22 DIAGNOSIS — I10 Essential (primary) hypertension: Secondary | ICD-10-CM

## 2017-05-22 DIAGNOSIS — E7800 Pure hypercholesterolemia, unspecified: Secondary | ICD-10-CM | POA: Diagnosis not present

## 2017-05-22 DIAGNOSIS — I480 Paroxysmal atrial fibrillation: Secondary | ICD-10-CM | POA: Diagnosis not present

## 2017-05-22 DIAGNOSIS — G4709 Other insomnia: Secondary | ICD-10-CM

## 2017-05-22 NOTE — Progress Notes (Signed)
Subjective:    Patient ID: Karen SpringKaryn Briles, female    DOB: Mar 21, 1926, 83 y.o.   MRN: [id number]  HPI  Routine follow up in [address] Reviewed status with RN, no new concerns Resident reports fatigue. She reports she often naps during the day and has trouble sleeping at night. Her appetite is good, weight is stable. She is independent with ADL's,. She denies issues with her bowel or bladder. She denies pain, reflux or SOB.  Depression: She reports she continues to have adjustment issues. She is taking Lexapro as prescribed and does feel like it is effective.   HLD: She is not taking any cholesterol lowering medications at this time. She consumes a low fat diet.  HTN: Her BP is fine off medication.   Hypothyroidism: She reports fatigue but denies any other issues on her current dose of Armour Thyroid.  Insomnia: She is taking Trazadone nightly.   Paroxysmal A Fib: She denies palpitations or shortness of breath. She does not taken anything for this.  Review of Systems      Past Medical History:  Diagnosis Date  . Allergic rhinitis   . Depressive disorder, not elsewhere classified   . Hearing loss    bilateral hearing aides  . History of MRI 9/10   Cardiac MRI, normal  . Hyperlipidemia   . Hypertension   . Hypothyroidism   . Insomnia, unspecified   . Osteopenia   . Paroxysmal A-fib (HCC) 2009   [facility]  . Teeth grinding   . Unspecified vitamin D deficiency     Current Outpatient Prescriptions  Medication Sig Dispense Refill  . albuterol (PROVENTIL HFA;VENTOLIN HFA) 108 (90 Base) MCG/ACT inhaler Inhale 2 puffs into the lungs every 6 (six) hours as needed for wheezing or shortness of breath. 1 Inhaler 0  . escitalopram (LEXAPRO) 5 MG tablet Take 1 tablet (5 mg total) by mouth daily. 30 tablet 2  . thyroid (ARMOUR) 90 MG tablet Take 90 mg by mouth daily. At 7am    . traZODone (DESYREL) 50 MG tablet Take 1-2 tablets (50-100 mg total) by mouth at bedtime. 60 tablet 11   No  current facility-administered medications for this visit.     Allergies  Allergen Reactions  . Ciprofloxacin     Muscle spasm   . Iodinated Diagnostic Agents     Iodine DYe  . Other     Dust Mites    Family History  Problem Relation Age of Onset  . Heart disease Mother   . Pneumonia Father     Social History   Social History  . Marital status: Married    Spouse name: N/A  . Number of children: 2  . Years of education: N/A   Occupational History  . High school teacher     Retired   Social History Main Topics  . Smoking status: Never Smoker  . Smokeless tobacco: Never Used  . Alcohol use 0.6 oz/week    1 Glasses of wine per week     Comment: 1 glass of wine per night  . Drug use: No  . Sexual activity: Not on file   Other Topics Concern  . Not on file   Social History Narrative   Son and daughter      Has living will   Daughter is health care POA   Has DNR ---confirmed and rewritten 02/07/17   No tube    [employer]     Constitutional: Pt reports fatigue. Denies fever, malaise,  headache or abrupt weight changes.  HEENT: Denies eye pain, eye redness, ear pain, ringing in the ears, wax buildup, runny nose, nasal congestion, bloody nose, or sore throat. Respiratory: Denies difficulty breathing, shortness of breath, cough or sputum production.   Cardiovascular: Denies chest pain, chest tightness, palpitations or swelling in the hands or feet.  Gastrointestinal: Denies abdominal pain, bloating, constipation, diarrhea or blood in the stool.  GU: Denies urgency, frequency, pain with urination, burning sensation, blood in urine, odor or discharge. Musculoskeletal: Denies decrease in range of motion, difficulty with gait, muscle pain or joint pain and swelling.  Skin: Denies redness, rashes, lesions or ulcercations.  Neurological: Denies dizziness, difficulty with memory, difficulty with speech or problems with balance and coordination.  Psych: Denies anxiety,  depression, SI/HI.  No other specific complaints in a complete review of systems (except as listed in HPI above).  Objective:   Physical Exam  BP 134/82   Pulse 78   Wt 125 lb (56.7 kg)   BMI 20.18 kg/m  Wt Readings from Last 3 Encounters:  05/22/17 125 lb (56.7 kg)  02/07/17 127 lb (57.6 kg)  12/11/16 127 lb 8 oz (57.8 kg)    General: Appears her stated age, well developed, well nourished in NAD. Cardiovascular: Normal rate and rhythm. S1,S2 noted.  No murmur, rubs or gallops noted. No JVD or BLE edema. Pulmonary/Chest: Normal effort and positive vesicular breath sounds. No respiratory distress. No wheezes, rales or ronchi noted.  Abdomen: Soft and nontender.  Musculoskeletal:  No difficulty with gait.  Neurological: Alert and oriented.  Psychiatric: Mood and affect mildly flat. Behavior is normal. Judgment and thought content normal.     BMET    Component Value Date/Time   NA 142 06/09/2012 1346   NA 144 11/10/2011 1018   K 4.3 06/09/2012 1346   K 4.1 11/10/2011 1018   CL 105 06/09/2012 1346   CL 107 11/10/2011 1018   CO2 26 06/09/2012 1346   CO2 26 11/10/2011 1018   GLUCOSE 87 06/09/2012 1346   GLUCOSE 74 11/10/2011 1018   BUN 18 06/09/2012 1346   BUN 16 11/10/2011 1018   CREATININE 0.58 06/09/2012 1346   CALCIUM 9.3 06/09/2012 1346   CALCIUM 8.7 11/10/2011 1018   GFRNONAA >60 11/10/2011 1018   GFRAA >60 11/10/2011 1018    Lipid Panel  No results found for: CHOL, TRIG, HDL, CHOLHDL, VLDL, LDLCALC  CBC    Component Value Date/Time   WBC 8.4 06/09/2012 1418   RBC 4.22 06/09/2012 1418   HGB 12.3 06/09/2012 1418   HGB 12.6 11/10/2011 1018   HCT 37.0 06/09/2012 1418   HCT 37.9 11/10/2011 1018   PLT 354 06/09/2012 1418   PLT 271 11/10/2011 1018   MCV 87.7 06/09/2012 1418   MCV 90 11/10/2011 1018   MCH 29.1 06/09/2012 1418   MCHC 33.2 06/09/2012 1418   RDW 14.6 06/09/2012 1418   RDW 14.7 (H) 11/10/2011 1018   LYMPHSABS 1.7 06/09/2012 1418   MONOABS  0.4 06/09/2012 1418   EOSABS 0.2 06/09/2012 1418   BASOSABS 0.0 06/09/2012 1418    Hgb A1C No results found for: HGBA1C          Assessment & Plan:   Depression:  She still hasn't completely adjusted We discussed increasing her Lexapro to 10 mg daily, but she wants to hold off at this time Support offered today  Hypothyroidism:  Yearly Free T4 Continue current dose of Armour Thyroid at this time  Insomnia:  Encouraged her to be more active during the day and try to avoid napping Continue Trazadone as needed  HTN:   BP fine off meds Will monitor  HLD:  Yearly lipid panel Monitor for now  Afib:  No issues off meds Will monitor  Will reassess as needed Nicki Reaper, NP

## 2017-06-03 ENCOUNTER — Telehealth: Payer: Self-pay

## 2017-06-03 NOTE — Telephone Encounter (Signed)
PLEASE NOTE: All timestamps contained within this report are represented as [place] Standard Time. CONFIDENTIALTY NOTICE: This fax transmission is intended only for the addressee. It contains information that is legally privileged, confidential or otherwise protected from use or disclosure. If you are not the intended recipient, you are strictly prohibited from reviewing, disclosing, copying using or disseminating any of this information or taking any action in reliance on or regarding this information. If you have received this fax in error, please notify us immediately by telephone so that we can arrange for its return to us. Phone: [phone], Toll-Free: [phone], Fax: [phone] Page: 1 of 1 Call Id: [id number] [facility] Client Nonclinical Telephone Record [facility] Client [facility] - [facility] Night Physician Tillman AbideLetvak, Richard - MD Contact Type Call Call Type Home Care Hospice Page Now Who Is Calling Home Health / Hospice Agency [facility] Facility Number [phone] Patient Name Karen Ribaskaren Aderman Patient DOB 11-Aug-1926 Reason for Call Request to speak to Physician Initial Comment Rn with [facility] states a resident knocked her foot on the bed an they would like an order for an x-ray Additional Comment Paging DoctorName Phone DateTime Result/Outcome Message Type Notes Hannah BeatCopland, Spencer - MD [phone] 06/01/2017 1:53:46 PM Called On Call Provider - Reached Doctor Paged Hannah Beatopland, Spencer - MD 06/01/2017 1:53:50 PM Spoke with On Call - General Message Result Call Closed By: Rudi RummageAmy Green Transaction Date/Time: 06/01/2017 1:44:53 PM (ET)

## 2017-06-03 NOTE — Telephone Encounter (Signed)
Glad to hear no fracture Will consider PT if having trouble walking on it

## 2017-06-03 NOTE — Telephone Encounter (Signed)
PLEASE NOTE: All timestamps contained within this report are represented as [place] Standard Time. CONFIDENTIALTY NOTICE: This fax transmission is intended only for the addressee. It contains information that is legally privileged, confidential or otherwise protected from use or disclosure. If you are not the intended recipient, you are strictly prohibited from reviewing, disclosing, copying using or disseminating any of this information or taking any action in reliance on or regarding this information. If you have received this fax in error, please notify us immediately by telephone so that we can arrange for its return to us. Phone: [phone], Toll-Free: [phone], Fax: [phone] Page: 1 of 1 Call Id: [id number] [facility] Client Nonclinical Telephone Record [facility] Physician Tillman AbideLetvak, Richard - MD Contact Type Call Who Is Calling Physician / Provider / Hospital Call Type Provider Call Message Only Reason for Call Request to send message to Office Initial Comment Morrie Sheldonshley with [facility] at [phone]. Pt has no acute fracture. Notify office only. Declined on call. Pt: Izora RibasKaren Fleishman, DOB: 01/01/1926 Additional Comment Call Closed By: Rowe ClackEada Webb Transaction Date/Time: 06/01/2017 6:38:05 PM (ET)

## 2017-08-01 ENCOUNTER — Encounter: Payer: Self-pay | Admitting: Internal Medicine

## 2017-08-22 ENCOUNTER — Ambulatory Visit: Payer: Medicare HMO | Admitting: Internal Medicine

## 2017-08-22 ENCOUNTER — Encounter: Payer: Self-pay | Admitting: Internal Medicine

## 2017-08-22 VITALS — BP 104/62 | HR 70 | Temp 97.1°F | Resp 16 | Wt 136.0 lb

## 2017-08-22 DIAGNOSIS — I480 Paroxysmal atrial fibrillation: Secondary | ICD-10-CM | POA: Diagnosis not present

## 2017-08-22 DIAGNOSIS — F39 Unspecified mood [affective] disorder: Secondary | ICD-10-CM | POA: Diagnosis not present

## 2017-08-22 DIAGNOSIS — R1030 Lower abdominal pain, unspecified: Secondary | ICD-10-CM | POA: Diagnosis not present

## 2017-08-22 DIAGNOSIS — G3184 Mild cognitive impairment, so stated: Secondary | ICD-10-CM

## 2017-08-22 DIAGNOSIS — I10 Essential (primary) hypertension: Secondary | ICD-10-CM | POA: Diagnosis not present

## 2017-08-22 NOTE — Assessment & Plan Note (Signed)
Denies palpitations but then said she thinks her a fib has acted up Exam still normal

## 2017-08-22 NOTE — Assessment & Plan Note (Signed)
BP Readings from Last 3 Encounters:  08/22/17 104/62  05/22/17 134/82  03/21/17 120/68   Good control

## 2017-08-22 NOTE — Assessment & Plan Note (Signed)
More depressed now and withdrawing from her former activities Discussed increasing meds--but will hold off for now Urged her to become more socially involved (she states she is withdrawing due to hearing loss)

## 2017-08-22 NOTE — Progress Notes (Signed)
Subjective:    Patient ID: Karen Colon, female    DOB: December 03, 1925, 83 y.o.   MRN: [id number]  HPI Visit to her room for follow up of chronic medical conditions Reviewed status with Magda PaganiniAudrey  Husband and daughter here  Has had some significant GI problems in the past week Has had some constipation Was on probiotic but then having some loose stools. It was stopped and got constipated again Now back on twice a week  Trouble sleeping continues Still enjoying lots of cold lattes, etc in her room Daughter thinks she may be lactose intolerant--but not clearly an issue in the past  No blood in stool No nausea or vomiting Stomach "hurts"---points to entire lower abdomen States it is all the time--no clear change with eating or moving bowels No clear distention No bowel movement for 3 days Got laxative in middle of night and then blow out this morning Pain is better now  No SOB No chest pain No palptitions No dizziness or sycnope No edema"  Memory issues have gotten worse Trouble remembering day to day things (like what meds she has taken, etc)  "I actually feel that I am dying" Not really depressed though Does like to walk, feeds the ducks---but quit book club and other activities  Current Outpatient Medications on File Prior to Visit  Medication Sig Dispense Refill  . albuterol (PROVENTIL HFA;VENTOLIN HFA) 108 (90 Base) MCG/ACT inhaler Inhale 2 puffs into the lungs every 6 (six) hours as needed for wheezing or shortness of breath. 1 Inhaler 0  . escitalopram (LEXAPRO) 5 MG tablet Take 1 tablet (5 mg total) by mouth daily. 30 tablet 2  . thyroid (ARMOUR) 90 MG tablet Take 90 mg by mouth daily. At 7am    . traZODone (DESYREL) 50 MG tablet Take 1-2 tablets (50-100 mg total) by mouth at bedtime. 60 tablet 11   No current facility-administered medications on file prior to visit.     Allergies  Allergen Reactions  . Ciprofloxacin     Muscle spasm   . Iodinated Diagnostic  Agents     Iodine DYe  . Other     Dust Mites    Past Medical History:  Diagnosis Date  . Allergic rhinitis   . Depressive disorder, not elsewhere classified   . Hearing loss    bilateral hearing aides  . History of MRI 9/10   Cardiac MRI, normal  . Hyperlipidemia   . Hypertension   . Hypothyroidism   . Insomnia, unspecified   . Osteopenia   . Paroxysmal A-fib (HCC) 2009   [facility]  . Teeth grinding   . Unspecified vitamin D deficiency     Past Surgical History:  Procedure Laterality Date  . APPENDECTOMY  2009  . TONSILLECTOMY      Family History  Problem Relation Age of Onset  . Heart disease Mother   . Pneumonia Father     Social History   Socioeconomic History  . Marital status: Married    Spouse name: Not on file  . Number of children: 2  . Years of education: Not on file  . Highest education level: Not on file  Social Needs  . Financial resource strain: Not on file  . Food insecurity - worry: Not on file  . Food insecurity - inability: Not on file  . Transportation needs - medical: Not on file  . Transportation needs - non-medical: Not on file  Occupational History  . Occupation: Psychologist, forensicHigh school teacher  Comment: Retired  Tobacco Use  . Smoking status: Never Smoker  . Smokeless tobacco: Never Used  Substance and Sexual Activity  . Alcohol use: Yes    Alcohol/week: 0.6 oz    Types: 1 Glasses of wine per week    Comment: 1 glass of wine per night  . Drug use: No  . Sexual activity: Not on file  Other Topics Concern  . Not on file  Social History Narrative   Son and daughter      Has living will   Daughter is health care POA   Has DNR ---confirmed and rewritten 02/07/17   No tube    [facility]   Review of Systems Hips are sore Not sure about her sleep overall---not noted to be a big issue with staff No sig back or joint pain     Objective:   Physical Exam  Constitutional: She appears well-developed. No distress.  Neck: No thyromegaly  present.  Cardiovascular: Normal rate, regular rhythm and normal heart sounds. Exam reveals no gallop.  No murmur heard. Pulmonary/Chest: Effort normal and breath sounds normal. No respiratory distress. She has no wheezes. She has no rales.  Abdominal: Bowel sounds are normal. She exhibits no distension and no mass. There is no tenderness. There is no rebound and no guarding.  Musculoskeletal: She exhibits no edema or tenderness.  Lymphadenopathy:    She has no cervical adenopathy.  Psychiatric:  Voices disatisfaction in general but not overtly depressed          Assessment & Plan:

## 2017-08-22 NOTE — Assessment & Plan Note (Signed)
No N/V, weight loss, blood in stool, change in appetite Better today after a blow out stool Doesn't really seem to be lactose intolerance Likely just constipation Discussed at length and reassured---- will try senna daily

## 2017-08-22 NOTE — Assessment & Plan Note (Signed)
Has worsened some---- but could be related to her mood Doing okay with the support of AL staff

## 2017-11-14 ENCOUNTER — Other Ambulatory Visit: Payer: Self-pay

## 2017-11-14 ENCOUNTER — Inpatient Hospital Stay
Admission: EM | Admit: 2017-11-14 | Discharge: 2017-11-16 | DRG: 310 | Disposition: A | Discharge status: Skilled Nursing Facility | Payer: MEDICARE | Attending: Internal Medicine | Admitting: Internal Medicine

## 2017-11-14 ENCOUNTER — Emergency Department: Payer: MEDICARE

## 2017-11-14 DIAGNOSIS — H9193 Unspecified hearing loss, bilateral: Secondary | ICD-10-CM | POA: Diagnosis present

## 2017-11-14 DIAGNOSIS — Z974 Presence of external hearing-aid: Secondary | ICD-10-CM

## 2017-11-14 DIAGNOSIS — G3184 Mild cognitive impairment, so stated: Secondary | ICD-10-CM | POA: Diagnosis present

## 2017-11-14 DIAGNOSIS — Z881 Allergy status to other antibiotic agents status: Secondary | ICD-10-CM | POA: Diagnosis not present

## 2017-11-14 DIAGNOSIS — Z8249 Family history of ischemic heart disease and other diseases of the circulatory system: Secondary | ICD-10-CM | POA: Diagnosis not present

## 2017-11-14 DIAGNOSIS — I959 Hypotension, unspecified: Secondary | ICD-10-CM | POA: Diagnosis not present

## 2017-11-14 DIAGNOSIS — H9190 Unspecified hearing loss, unspecified ear: Secondary | ICD-10-CM | POA: Diagnosis not present

## 2017-11-14 DIAGNOSIS — Z91041 Radiographic dye allergy status: Secondary | ICD-10-CM | POA: Diagnosis not present

## 2017-11-14 DIAGNOSIS — J101 Influenza due to other identified influenza virus with other respiratory manifestations: Secondary | ICD-10-CM | POA: Diagnosis present

## 2017-11-14 DIAGNOSIS — E785 Hyperlipidemia, unspecified: Secondary | ICD-10-CM | POA: Diagnosis not present

## 2017-11-14 DIAGNOSIS — J309 Allergic rhinitis, unspecified: Secondary | ICD-10-CM | POA: Diagnosis not present

## 2017-11-14 DIAGNOSIS — Z836 Family history of other diseases of the respiratory system: Secondary | ICD-10-CM | POA: Diagnosis not present

## 2017-11-14 DIAGNOSIS — G4700 Insomnia, unspecified: Secondary | ICD-10-CM | POA: Diagnosis present

## 2017-11-14 DIAGNOSIS — Z7951 Long term (current) use of inhaled steroids: Secondary | ICD-10-CM | POA: Diagnosis not present

## 2017-11-14 DIAGNOSIS — I4891 Unspecified atrial fibrillation: Secondary | ICD-10-CM | POA: Diagnosis present

## 2017-11-14 DIAGNOSIS — M8580 Other specified disorders of bone density and structure, unspecified site: Secondary | ICD-10-CM | POA: Diagnosis not present

## 2017-11-14 DIAGNOSIS — E559 Vitamin D deficiency, unspecified: Secondary | ICD-10-CM | POA: Diagnosis present

## 2017-11-14 DIAGNOSIS — R001 Bradycardia, unspecified: Secondary | ICD-10-CM | POA: Diagnosis not present

## 2017-11-14 DIAGNOSIS — E039 Hypothyroidism, unspecified: Secondary | ICD-10-CM | POA: Diagnosis present

## 2017-11-14 DIAGNOSIS — F39 Unspecified mood [affective] disorder: Secondary | ICD-10-CM | POA: Diagnosis present

## 2017-11-14 DIAGNOSIS — I10 Essential (primary) hypertension: Secondary | ICD-10-CM | POA: Diagnosis not present

## 2017-11-14 DIAGNOSIS — Z79890 Hormone replacement therapy (postmenopausal): Secondary | ICD-10-CM

## 2017-11-14 DIAGNOSIS — I480 Paroxysmal atrial fibrillation: Principal | ICD-10-CM | POA: Diagnosis present

## 2017-11-14 DIAGNOSIS — Z888 Allergy status to other drugs, medicaments and biological substances status: Secondary | ICD-10-CM

## 2017-11-14 DIAGNOSIS — Z79899 Other long term (current) drug therapy: Secondary | ICD-10-CM

## 2017-11-14 DIAGNOSIS — F329 Major depressive disorder, single episode, unspecified: Secondary | ICD-10-CM | POA: Diagnosis present

## 2017-11-14 LAB — CBC WITH DIFFERENTIAL/PLATELET
BASOS PCT: 1 %
Basophils Absolute: 0 10*3/uL (ref 0–0.1)
Eosinophils Absolute: 0 10*3/uL (ref 0–0.7)
Eosinophils Relative: 1 %
HEMATOCRIT: 37.2 % (ref 35.0–47.0)
HEMOGLOBIN: 12.4 g/dL (ref 12.0–16.0)
LYMPHS ABS: 1.7 10*3/uL (ref 1.0–3.6)
LYMPHS PCT: 27 %
MCH: 28.8 pg (ref 26.0–34.0)
MCHC: 33.3 g/dL (ref 32.0–36.0)
MCV: 86.5 fL (ref 80.0–100.0)
MONOS PCT: 6 %
Monocytes Absolute: 0.4 10*3/uL (ref 0.2–0.9)
NEUTROS ABS: 4.3 10*3/uL (ref 1.4–6.5)
NEUTROS PCT: 65 %
Platelets: 224 10*3/uL (ref 150–440)
RBC: 4.31 MIL/uL (ref 3.80–5.20)
RDW: 13.8 % (ref 11.5–14.5)
WBC: 6.5 10*3/uL (ref 3.6–11.0)

## 2017-11-14 MED ORDER — MAGNESIUM SULFATE 4 GM/100ML IV SOLN
4.0000 g | Freq: Once | INTRAVENOUS | Status: AC
  Administered 2017-11-14: 4 g via INTRAVENOUS
  Filled 2017-11-14: qty 100

## 2017-11-14 MED ORDER — SODIUM CHLORIDE 0.9 % IV BOLUS (SEPSIS)
1000.0000 mL | Freq: Once | INTRAVENOUS | Status: AC
  Administered 2017-11-14: 1000 mL via INTRAVENOUS

## 2017-11-14 NOTE — ED Triage Notes (Signed)
Pt arrived [facility] d/t falling after feeling sick and faint. EMS reports pt has hx of A. Fib and dementia. Pt is A&Ox 3 at this time.

## 2017-11-14 NOTE — ED Provider Notes (Signed)
[facility] Emergency Department Provider Note  ____________________________________________   First MD Initiated Contact with Patient 11/14/17 2337     (approximate)  I have reviewed the triage vital signs and the nursing notes.   HISTORY  Chief Complaint Fall   HPI Karen Colon is a 84 y.o. female who comes to the emergency department via EMS from her nursing home for generalized weakness and a near syncopal event.  She has felt generally sick for the past several days with malaise myalgias and dry cough.  Today she got up to walk to the bathroom felt weak and fell to the ground.  She did not strike her head.  She has been taking Tamiflu prophylaxis as multiple people at her nursing home have become ill with influenza.  She herself denies fevers.  She does note that her symptoms had insidious onset and have been gradually progressive.  Seem to be somewhat worse with standing up and walking and definitely are improved with rest.  She has sharp moderate severity upper chest pain nonradiating.  Worse with cough improved when not coughing.  EMS noted the patient was hypotensive and bradycardic in route.  Past Medical History:  Diagnosis Date  . Allergic rhinitis   . Depressive disorder, not elsewhere classified   . Hearing loss    bilateral hearing aides  . History of MRI 9/10   Cardiac MRI, normal  . Hyperlipidemia   . Hypertension   . Hypothyroidism   . Insomnia, unspecified   . Osteopenia   . Paroxysmal A-fib (HCC) 2009   [facility]  . Teeth grinding   . Unspecified vitamin D deficiency     Patient Active Problem List   Diagnosis Date Noted  . Atrial fibrillation with RVR (HCC) 11/15/2017  . Lower abdominal pain 08/22/2017  . Mood disorder (HCC) 02/07/2017  . Mild cognitive impairment 06/25/2012  . Hypothyroidism 06/09/2012  . Allergic rhinitis 06/09/2012  . Atrial fibrillation (HCC) 06/12/2011  . HYPERTENSION, BENIGN 03/14/2009    Past Surgical  History:  Procedure Laterality Date  . APPENDECTOMY  2009  . TONSILLECTOMY      Prior to Admission medications   Medication Sig Start Date End Date Taking? Authorizing Provider  acidophilus (RISAQUAD) CAPS capsule Take 1 capsule by mouth daily.   Yes [provider]  albuterol (PROVENTIL HFA;VENTOLIN HFA) 108 (90 Base) MCG/ACT inhaler Inhale 2 puffs into the lungs every 6 (six) hours as needed for wheezing or shortness of breath. 03/21/17  Yes Baity, Salvadore Oxford, NP  cetirizine (ZYRTEC) 10 MG tablet Take 10 mg by mouth at bedtime. 11/12/17 11/23/17 Yes [provider]  escitalopram (LEXAPRO) 5 MG tablet Take 1 tablet (5 mg total) by mouth daily. 03/21/17  Yes Baity, Salvadore Oxford, NP  fluticasone (FLONASE) 50 MCG/ACT nasal spray Place 1 spray into both nostrils daily.   Yes [provider]  oseltamivir (TAMIFLU) 75 MG capsule Take 75 mg by mouth daily. 11/13/17 11/23/17 Yes [provider]  thyroid (ARMOUR) 90 MG tablet Take 90 mg by mouth daily. At 7am   Yes [provider]  traZODone (DESYREL) 100 MG tablet Take 100 mg by mouth at bedtime as needed for sleep.   Yes [provider]  traZODone (DESYREL) 50 MG tablet Take 1-2 tablets (50-100 mg total) by mouth at bedtime. Patient not taking: Reported on 11/15/2017 02/07/17   Karie Schwalbe, MD    Allergies Ciprofloxacin; Iodinated diagnostic agents; and Other  Family History  Problem Relation  Age of Onset  . Heart disease Mother   . Pneumonia Father     Social History Social History   Tobacco Use  . Smoking status: Never Smoker  . Smokeless tobacco: Never Used  Substance Use Topics  . Alcohol use: Yes    Alcohol/week: 0.6 oz    Types: 1 Glasses of wine per week    Comment: 1 glass of wine per night  . Drug use: No    Review of Systems Constitutional: No fever/chills Eyes: No visual changes. ENT: No sore throat. Cardiovascular: Positive for chest pain. Respiratory: Positive for  shortness of breath. Gastrointestinal: No abdominal pain.  No nausea, no vomiting.  No diarrhea.  No constipation. Genitourinary: Negative for dysuria. Musculoskeletal: Negative for back pain. Skin: Negative for rash. Neurological: Negative for headaches, focal weakness or numbness.   ____________________________________________   PHYSICAL EXAM:  VITAL SIGNS: ED Triage Vitals  Enc Vitals Group     BP      Pulse      Resp      Temp      Temp src      SpO2      Weight      Height      Head Circumference      Peak Flow      Pain Score      Pain Loc      Pain Edu?      Excl. in GC?     Constitutional: Alert and oriented x4 appears uncomfortable no diaphoresis Eyes: PERRL EOMI. Head: Atraumatic. Nose: No congestion/rhinnorhea. Mouth/Throat: No trismus Neck: No stridor.   Cardiovascular: Irregularly irregular and tachycardic Respiratory: Increased respiratory effort.  No retractions. Lungs CTAB and moving good air Gastrointestinal: Soft nontender Musculoskeletal: No lower extremity edema   Neurologic:  Normal speech and language. No gross focal neurologic deficits are appreciated. Skin:  Skin is warm, dry and intact. No rash noted. Psychiatric: Mood and affect are normal. Speech and behavior are normal.    ____________________________________________   DIFFERENTIAL includes but not limited to  Dehydration, sepsis, pneumonia, pneumothorax, pulmonary embolism, atrial fibrillation ____________________________________________   LABS (all labs ordered are listed, but only abnormal results are displayed)  Labs Reviewed  COMPREHENSIVE METABOLIC PANEL - Abnormal; Notable for the following components:      Result Value   Sodium 132 (*)    Potassium 3.0 (*)    Chloride 100 (*)    CO2 21 (*)    Glucose, Bld 163 (*)    Calcium 7.9 (*)    Total Protein 5.7 (*)    Albumin 3.2 (*)    All other components within normal limits  URINALYSIS, COMPLETE (UACMP) WITH  MICROSCOPIC - Abnormal; Notable for the following components:   Color, Urine YELLOW (*)    APPearance HAZY (*)    Ketones, ur 5 (*)    Protein, ur 100 (*)    Bacteria, UA RARE (*)    Squamous Epithelial / LPF 0-5 (*)    All other components within normal limits  INFLUENZA PANEL BY PCR (TYPE A & B) - Abnormal; Notable for the following components:   Influenza A By PCR POSITIVE (*)    All other components within normal limits  TSH - Abnormal; Notable for the following components:   TSH 0.012 (*)    All other components within normal limits  MRSA PCR SCREENING  LIPASE, BLOOD  TROPONIN I  CBC WITH DIFFERENTIAL/PLATELET  T4, FREE    Lab work  reviewed by me shows the patient is influenza positive. Low TSH but T4 is within normal limits __________________________________________  EKG  ED ECG REPORT I, Merrily Brittle, the attending physician, personally viewed and interpreted this ECG.  Date: 11/14/2017 EKG Time:  Rate: 140 Rhythm: Atrial fibrillation with rapid ventricular response QRS Axis: normal Intervals: normal ST/T Wave abnormalities: Inferior ST depression consistent with rate related ischemia Narrative Interpretation: Most likely consistent with rate related ischemia not true primary myocardial ischemia  ____________________________________________  RADIOLOGY  Chest x-ray reviewed by me with some suggestion of pulmonary congestion ____________________________________________   PROCEDURES  Procedure(s) performed: no  .Critical Care Performed by: Merrily Brittle, MD Authorized by: Merrily Brittle, MD   Critical care provider statement:    Critical care time (minutes):  35   Critical care time was exclusive of:  Separately billable procedures and treating other patients   Critical care was necessary to treat or prevent imminent or life-threatening deterioration of the following conditions:  Dehydration and circulatory failure   Critical care was time spent  personally by me on the following activities:  Development of treatment plan with patient or surrogate, discussions with consultants, evaluation of patient's response to treatment, examination of patient, obtaining history from patient or surrogate, ordering and performing treatments and interventions, ordering and review of laboratory studies, ordering and review of radiographic studies, pulse oximetry, re-evaluation of patient's condition and review of old charts    Critical Care performed: yes  Observation: no ____________________________________________   INITIAL IMPRESSION / ASSESSMENT AND PLAN / ED COURSE  Pertinent labs & imaging results that were available during my care of the patient were reviewed by me and considered in my medical decision making (see chart for details).  On arrival the patient is irregularly [place] in atrial fibrillation to 160s.  Blood pressure is soft at about 100.  Unclear if this is secondary to dehydration and illness versus primary cardiac in etiology.  I will begin with some fluids and 4 g of magnesium as a bolus now however hold off on diltiazem or beta blockers given her blood pressure.  The patient's heart rate has slightly improved after fluids and magnesium.  Her renal function is normal so I will also bolus her with digoxin.  She came back influenza positive and is significantly dehydrated.  At this point she does require inpatient admission for continued Tamiflu, fluid resuscitation, and management of her atrial fibrillation with rapid ventricular response.  The patient herself verbalizes understanding and agreement with the plan.  I then discussed with the hospitalist Dr. Anne Hahn who has graciously agreed to admit the patient to his service.      ____________________________________________   FINAL CLINICAL IMPRESSION(S) / ED DIAGNOSES  Final diagnoses:  Atrial fibrillation with RVR (HCC)  Influenza A      NEW MEDICATIONS STARTED DURING  THIS VISIT:  Current Discharge Medication List       Note:  This document was prepared using Dragon voice recognition software and may include unintentional dictation errors.     Merrily Brittle, MD 11/15/17 [phone]

## 2017-11-15 ENCOUNTER — Other Ambulatory Visit: Payer: Self-pay

## 2017-11-15 DIAGNOSIS — J101 Influenza due to other identified influenza virus with other respiratory manifestations: Secondary | ICD-10-CM | POA: Diagnosis present

## 2017-11-15 DIAGNOSIS — E039 Hypothyroidism, unspecified: Secondary | ICD-10-CM | POA: Diagnosis not present

## 2017-11-15 DIAGNOSIS — Z7951 Long term (current) use of inhaled steroids: Secondary | ICD-10-CM | POA: Diagnosis not present

## 2017-11-15 DIAGNOSIS — I480 Paroxysmal atrial fibrillation: Secondary | ICD-10-CM | POA: Diagnosis not present

## 2017-11-15 DIAGNOSIS — I959 Hypotension, unspecified: Secondary | ICD-10-CM | POA: Diagnosis not present

## 2017-11-15 DIAGNOSIS — Z8249 Family history of ischemic heart disease and other diseases of the circulatory system: Secondary | ICD-10-CM | POA: Diagnosis not present

## 2017-11-15 DIAGNOSIS — I4891 Unspecified atrial fibrillation: Secondary | ICD-10-CM | POA: Diagnosis present

## 2017-11-15 DIAGNOSIS — H9190 Unspecified hearing loss, unspecified ear: Secondary | ICD-10-CM | POA: Diagnosis not present

## 2017-11-15 DIAGNOSIS — Z836 Family history of other diseases of the respiratory system: Secondary | ICD-10-CM | POA: Diagnosis not present

## 2017-11-15 DIAGNOSIS — E785 Hyperlipidemia, unspecified: Secondary | ICD-10-CM | POA: Diagnosis not present

## 2017-11-15 DIAGNOSIS — E559 Vitamin D deficiency, unspecified: Secondary | ICD-10-CM | POA: Diagnosis not present

## 2017-11-15 DIAGNOSIS — F39 Unspecified mood [affective] disorder: Secondary | ICD-10-CM | POA: Diagnosis not present

## 2017-11-15 DIAGNOSIS — R001 Bradycardia, unspecified: Secondary | ICD-10-CM | POA: Diagnosis not present

## 2017-11-15 DIAGNOSIS — G3184 Mild cognitive impairment, so stated: Secondary | ICD-10-CM | POA: Diagnosis not present

## 2017-11-15 DIAGNOSIS — F329 Major depressive disorder, single episode, unspecified: Secondary | ICD-10-CM | POA: Diagnosis not present

## 2017-11-15 DIAGNOSIS — Z79899 Other long term (current) drug therapy: Secondary | ICD-10-CM | POA: Diagnosis not present

## 2017-11-15 DIAGNOSIS — J309 Allergic rhinitis, unspecified: Secondary | ICD-10-CM | POA: Diagnosis not present

## 2017-11-15 DIAGNOSIS — Z888 Allergy status to other drugs, medicaments and biological substances status: Secondary | ICD-10-CM | POA: Diagnosis not present

## 2017-11-15 DIAGNOSIS — Z91041 Radiographic dye allergy status: Secondary | ICD-10-CM | POA: Diagnosis not present

## 2017-11-15 DIAGNOSIS — H9193 Unspecified hearing loss, bilateral: Secondary | ICD-10-CM | POA: Diagnosis not present

## 2017-11-15 DIAGNOSIS — I10 Essential (primary) hypertension: Secondary | ICD-10-CM | POA: Diagnosis not present

## 2017-11-15 DIAGNOSIS — Z881 Allergy status to other antibiotic agents status: Secondary | ICD-10-CM | POA: Diagnosis not present

## 2017-11-15 DIAGNOSIS — M8580 Other specified disorders of bone density and structure, unspecified site: Secondary | ICD-10-CM | POA: Diagnosis not present

## 2017-11-15 DIAGNOSIS — G4700 Insomnia, unspecified: Secondary | ICD-10-CM | POA: Diagnosis not present

## 2017-11-15 DIAGNOSIS — Z974 Presence of external hearing-aid: Secondary | ICD-10-CM | POA: Diagnosis not present

## 2017-11-15 LAB — COMPREHENSIVE METABOLIC PANEL
ALBUMIN: 3.2 g/dL — AB (ref 3.5–5.0)
ALT: 18 U/L (ref 14–54)
AST: 34 U/L (ref 15–41)
Alkaline Phosphatase: 77 U/L (ref 38–126)
Anion gap: 11 (ref 5–15)
BUN: 18 mg/dL (ref 6–20)
CO2: 21 mmol/L — AB (ref 22–32)
Calcium: 7.9 mg/dL — ABNORMAL LOW (ref 8.9–10.3)
Chloride: 100 mmol/L — ABNORMAL LOW (ref 101–111)
Creatinine, Ser: 0.74 mg/dL (ref 0.44–1.00)
GFR calc Af Amer: >60 mL/min (ref >60)
GFR calc non Af Amer: >60 mL/min (ref >60)
GLUCOSE: 163 mg/dL — AB (ref 65–99)
POTASSIUM: 3 mmol/L — AB (ref 3.5–5.1)
Sodium: 132 mmol/L — ABNORMAL LOW (ref 135–145)
Total Bilirubin: 0.4 mg/dL (ref 0.3–1.2)
Total Protein: 5.7 g/dL — ABNORMAL LOW (ref 6.5–8.1)

## 2017-11-15 LAB — T4, FREE: Free T4: 0.99 ng/dL (ref 0.61–1.12)

## 2017-11-15 LAB — URINALYSIS, COMPLETE (UACMP) WITH MICROSCOPIC
Bilirubin Urine: NEGATIVE
GLUCOSE, UA: NEGATIVE mg/dL
Hgb urine dipstick: NEGATIVE
KETONES UR: 5 mg/dL — AB
Leukocytes, UA: NEGATIVE
NITRITE: NEGATIVE
PROTEIN: 100 mg/dL — AB
Specific Gravity, Urine: 1.021 (ref 1.005–1.030)
pH: 5 (ref 5.0–8.0)

## 2017-11-15 LAB — INFLUENZA PANEL BY PCR (TYPE A & B)
Influenza A By PCR: POSITIVE — AB
Influenza B By PCR: NEGATIVE

## 2017-11-15 LAB — MRSA PCR SCREENING: MRSA by PCR: NEGATIVE

## 2017-11-15 LAB — TROPONIN I: Troponin I: <0.03 ng/mL (ref <0.03)

## 2017-11-15 LAB — TSH: TSH: 0.012 u[IU]/mL — AB (ref 0.350–4.500)

## 2017-11-15 LAB — LIPASE, BLOOD: Lipase: 40 U/L (ref 11–51)

## 2017-11-15 MED ORDER — POTASSIUM CHLORIDE CRYS ER 20 MEQ PO TBCR
40.0000 meq | EXTENDED_RELEASE_TABLET | ORAL | Status: AC
  Administered 2017-11-15: 40 meq via ORAL

## 2017-11-15 MED ORDER — ENOXAPARIN SODIUM 40 MG/0.4ML ~~LOC~~ SOLN
40.0000 mg | SUBCUTANEOUS | Status: DC
  Administered 2017-11-15: 40 mg via SUBCUTANEOUS
  Filled 2017-11-15: qty 0.4

## 2017-11-15 MED ORDER — DILTIAZEM LOAD VIA INFUSION
10.0000 mg | Freq: Once | INTRAVENOUS | Status: DC
  Filled 2017-11-15: qty 10

## 2017-11-15 MED ORDER — POTASSIUM CHLORIDE CRYS ER 20 MEQ PO TBCR
EXTENDED_RELEASE_TABLET | ORAL | Status: AC
  Filled 2017-11-15: qty 2

## 2017-11-15 MED ORDER — TRAZODONE HCL 100 MG PO TABS: 100.0000 mg | ORAL_TABLET | Freq: Every evening | ORAL | Status: DC | PRN

## 2017-11-15 MED ORDER — FLUTICASONE PROPIONATE 50 MCG/ACT NA SUSP
1.0000 | Freq: Every day | NASAL | Status: DC
  Administered 2017-11-15 – 2017-11-16 (×2): 1 via NASAL
  Filled 2017-11-15: qty 16

## 2017-11-15 MED ORDER — DEXTROSE 5 % IV SOLN: 5.0000 mg/h | INTRAVENOUS | Status: DC

## 2017-11-15 MED ORDER — DILTIAZEM HCL 100 MG IV SOLR
INTRAVENOUS | Status: AC
  Filled 2017-11-15: qty 100

## 2017-11-15 MED ORDER — ESCITALOPRAM OXALATE 5 MG PO TABS
5.0000 mg | ORAL_TABLET | Freq: Every day | ORAL | Status: DC
  Administered 2017-11-15 – 2017-11-16 (×2): 5 mg via ORAL
  Filled 2017-11-15 (×3): qty 1

## 2017-11-15 MED ORDER — DIGOXIN 0.25 MG/ML IJ SOLN
250.0000 ug | Freq: Once | INTRAMUSCULAR | Status: AC
  Administered 2017-11-15: 250 ug via INTRAVENOUS
  Filled 2017-11-15: qty 2

## 2017-11-15 MED ORDER — ALBUTEROL SULFATE (2.5 MG/3ML) 0.083% IN NEBU: 2.5000 mg | INHALATION_SOLUTION | RESPIRATORY_TRACT | Status: DC | PRN

## 2017-11-15 MED ORDER — POTASSIUM CHLORIDE CRYS ER 20 MEQ PO TBCR
40.0000 meq | EXTENDED_RELEASE_TABLET | Freq: Once | ORAL | Status: AC
Start: 2017-11-15 — End: 2017-11-15
  Administered 2017-11-15: 40 meq via ORAL
  Filled 2017-11-15: qty 2

## 2017-11-15 MED ORDER — ONDANSETRON HCL 4 MG/2ML IJ SOLN: 4.0000 mg | Freq: Four times a day (QID) | INTRAMUSCULAR | Status: DC | PRN

## 2017-11-15 MED ORDER — ONDANSETRON HCL 4 MG PO TABS: 4.0000 mg | ORAL_TABLET | Freq: Four times a day (QID) | ORAL | Status: DC | PRN

## 2017-11-15 MED ORDER — ACETAMINOPHEN 325 MG PO TABS: 650.0000 mg | ORAL_TABLET | Freq: Four times a day (QID) | ORAL | Status: DC | PRN

## 2017-11-15 MED ORDER — ASPIRIN EC 81 MG PO TBEC
81.0000 mg | DELAYED_RELEASE_TABLET | Freq: Every day | ORAL | Status: DC
  Administered 2017-11-15 – 2017-11-16 (×2): 81 mg via ORAL
  Filled 2017-11-15 (×2): qty 1

## 2017-11-15 MED ORDER — THYROID 60 MG PO TABS
90.0000 mg | ORAL_TABLET | Freq: Every day | ORAL | Status: DC
  Administered 2017-11-15 – 2017-11-16 (×2): 90 mg via ORAL
  Filled 2017-11-15 (×2): qty 1

## 2017-11-15 MED ORDER — DOCUSATE SODIUM 100 MG PO CAPS
100.0000 mg | ORAL_CAPSULE | Freq: Two times a day (BID) | ORAL | Status: DC
  Administered 2017-11-15 – 2017-11-16 (×3): 100 mg via ORAL
  Filled 2017-11-15 (×3): qty 1

## 2017-11-15 MED ORDER — DILTIAZEM HCL 30 MG PO TABS: 30.0000 mg | ORAL_TABLET | Freq: Four times a day (QID) | ORAL | Status: DC | PRN

## 2017-11-15 MED ORDER — OSELTAMIVIR PHOSPHATE 75 MG PO CAPS: 75.0000 mg | ORAL_CAPSULE | Freq: Every day | ORAL | Status: DC

## 2017-11-15 MED ORDER — OSELTAMIVIR PHOSPHATE 75 MG PO CAPS
75.0000 mg | ORAL_CAPSULE | Freq: Once | ORAL | Status: AC
  Administered 2017-11-15: 75 mg via ORAL
  Filled 2017-11-15: qty 1

## 2017-11-15 MED ORDER — OSELTAMIVIR PHOSPHATE 30 MG PO CAPS
30.0000 mg | ORAL_CAPSULE | Freq: Two times a day (BID) | ORAL | Status: DC
  Administered 2017-11-15 – 2017-11-16 (×3): 30 mg via ORAL
  Filled 2017-11-15 (×4): qty 1

## 2017-11-15 MED ORDER — LORATADINE 10 MG PO TABS
10.0000 mg | ORAL_TABLET | Freq: Every day | ORAL | Status: DC
  Administered 2017-11-15 – 2017-11-16 (×2): 10 mg via ORAL
  Filled 2017-11-15 (×2): qty 1

## 2017-11-15 MED ORDER — RISAQUAD PO CAPS
1.0000 | ORAL_CAPSULE | Freq: Every day | ORAL | Status: DC
  Administered 2017-11-15 – 2017-11-16 (×2): 1 via ORAL
  Filled 2017-11-15 (×2): qty 1

## 2017-11-15 MED ORDER — ACETAMINOPHEN 650 MG RE SUPP: 650.0000 mg | Freq: Four times a day (QID) | RECTAL | Status: DC | PRN

## 2017-11-15 NOTE — Care Management (Signed)
Confirmed patient is from [facility] and unless functional status dramatically declined should return.  CSW informed. Positive for influenza A

## 2017-11-15 NOTE — Progress Notes (Signed)
PHARMACY - ELECTROLYTE MANAGEMENT PROGRESS NOTE  Pharmacy Consult for Electrolyte Management Indication: Hypokalemia   Allergies  Allergen Reactions  . Ciprofloxacin     Muscle spasm   . Iodinated Diagnostic Agents     Iodine DYe  . Other     Dust Mites    Patient Measurements: Height: 5\' 6"  (167.6 cm) Weight: 130 lb 12.8 oz (59.3 kg) IBW/kg (Calculated) : 59.3 Adjusted Body Weight:    Vital Signs: Temp: 98.4 F (36.9 C) (02/22 0938) Temp Source: Oral (02/22 0938) BP: 119/53 (02/22 16100938) Pulse Rate: 71 (02/22 0938) Intake/Output from previous day: 02/21 0701 - 02/22 0700 In: 1100 [IV Piggyback:1100] Out: -  Intake/Output from this shift: Total I/O In: -  Out: 175 [Urine:175] Vent settings for last 24 hours:    Labs: Recent Labs    11/14/17 2336  WBC 6.5  HGB 12.4  HCT 37.2  PLT 224  CREATININE 0.74  ALBUMIN 3.2*  PROT 5.7*  AST 34  ALT 18  ALKPHOS 77  BILITOT 0.4   Estimated Creatinine Clearance: 44.6 mL/min (by C-G formula based on SCr of 0.74 mg/dL).  No results for input(s): GLUCAP in the last 72 hours.  Microbiology: Recent Results (from the past 720 hour(s))  MRSA PCR Screening     Status: None   Collection Time: 11/15/17  2:54 AM  Result Value Ref Range Status   MRSA by PCR NEGATIVE NEGATIVE Final    Comment:        The GeneXpert MRSA Assay (FDA approved for NASAL specimens only), is one component of a comprehensive MRSA colonization surveillance program. It is not intended to diagnose MRSA infection nor to guide or monitor treatment for MRSA infections. Performed at [facility], [address] [place], [place] [address]     Assessment: Patient is a 84yo female admitted for afib, found to be Influenza type B positive. Pharmacy consulted for electrolyte management.  2/22 K=3.0, patient received KCl 40mEq PO x one dose early AM.  Goal of Therapy:  Maintain Electrolytes WNL  Plan:  Will order KCl 40mEq PO times  one. Will follow up on BMET and Magnesium with AM labs.  Clovia CuffLisa Trana Ressler, PharmD, BCPS 11/15/2017 3:45 PM

## 2017-11-15 NOTE — Progress Notes (Signed)
Admitted this morning for atrial fibrillation but converted to sinus rhythm i in the emergency room, found to have type B influenza.  According to family patient had cough .  #1 atrial fibrillation: Rate controlled now.  Continue to monitor on telemetry.  Continue aspirin, digoxin, check echocardiogram. 2.  Type a influenza: Continue Tamiflu. 3.  Depression, dementia: CODE STATUS DNR.  Patient has baseline confusion.  #4 hypokalemia replace the potassium. Time spent 25 minutes.

## 2017-11-15 NOTE — ED Notes (Signed)
Patient transported to X-ray 

## 2017-11-15 NOTE — H&P (Addendum)
Karen Colon is an 84 y.o. female.   Chief Complaint: Fall HPI: The patient with past medical history of paroxysmal atrial fibrillation and hypertension presents the emergency department from her nursing home after a fall.  It is unclear if the patient lost consciousness.  Family reports that she had felt unwell and has complained of cough.  In the emergency department the patient was found to have atrial fibrillation with rapid ventricular rate.  Her blood pressure was on the low side and the patient was laid with digoxin.  Diltiazem drip was ordered but was not required due to conversion to normal sinus rhythm prior to the emergency department staff called the hospitalist service for admission.  Past Medical History:  Diagnosis Date  . Allergic rhinitis   . Depressive disorder, not elsewhere classified   . Hearing loss    bilateral hearing aides  . History of MRI 9/10   Cardiac MRI, normal  . Hyperlipidemia   . Hypertension   . Hypothyroidism   . Insomnia, unspecified   . Osteopenia   . Paroxysmal A-fib ([place]) 2009   [facility]  . Teeth grinding   . Unspecified vitamin D deficiency     Past Surgical History:  Procedure Laterality Date  . APPENDECTOMY  2009  . TONSILLECTOMY      Family History  Problem Relation Age of Onset  . Heart disease Mother   . Pneumonia Father    Social History:  reports that  has never smoked. she has never used smokeless tobacco. She reports that she drinks about 0.6 oz of alcohol per week. She reports that she does not use drugs.  Allergies:  Allergies  Allergen Reactions  . Ciprofloxacin     Muscle spasm   . Iodinated Diagnostic Agents     Iodine DYe  . Other     Dust Mites    Medications Prior to Admission  Medication Sig Dispense Refill  . acidophilus (RISAQUAD) CAPS capsule Take 1 capsule by mouth daily.    Marland Kitchen albuterol (PROVENTIL HFA;VENTOLIN HFA) 108 (90 Base) MCG/ACT inhaler Inhale 2 puffs into the lungs every 6 (six) hours as needed for  wheezing or shortness of breath. 1 Inhaler 0  . cetirizine (ZYRTEC) 10 MG tablet Take 10 mg by mouth at bedtime.    Marland Kitchen escitalopram (LEXAPRO) 5 MG tablet Take 1 tablet (5 mg total) by mouth daily. 30 tablet 2  . fluticasone (FLONASE) 50 MCG/ACT nasal spray Place 1 spray into both nostrils daily.    Marland Kitchen oseltamivir (TAMIFLU) 75 MG capsule Take 75 mg by mouth daily.    Marland Kitchen thyroid (ARMOUR) 90 MG tablet Take 90 mg by mouth daily. At 7am    . traZODone (DESYREL) 100 MG tablet Take 100 mg by mouth at bedtime as needed for sleep.    . traZODone (DESYREL) 50 MG tablet Take 1-2 tablets (50-100 mg total) by mouth at bedtime. (Patient not taking: Reported on 11/15/2017) 60 tablet 11    Results for orders placed or performed during the hospital encounter of 11/14/17 (from the past 48 hour(s))  Comprehensive metabolic panel     Status: Abnormal   Collection Time: 11/14/17 11:36 PM  Result Value Ref Range   Sodium 132 (L) 135 - 145 mmol/L   Potassium 3.0 (L) 3.5 - 5.1 mmol/L   Chloride 100 (L) 101 - 111 mmol/L   CO2 21 (L) 22 - 32 mmol/L   Glucose, Bld 163 (H) 65 - 99 mg/dL   BUN 18 6 -  20 mg/dL   Creatinine, Ser 0.74 0.44 - 1.00 mg/dL   Calcium 7.9 (L) 8.9 - 10.3 mg/dL   Total Protein 5.7 (L) 6.5 - 8.1 g/dL   Albumin 3.2 (L) 3.5 - 5.0 g/dL   AST 34 15 - 41 U/L   ALT 18 14 - 54 U/L   Alkaline Phosphatase 77 38 - 126 U/L   Total Bilirubin 0.4 0.3 - 1.2 mg/dL   GFR calc non Af Amer >60 >60 mL/min   GFR calc Af Amer >60 >60 mL/min    Comment: (NOTE) The eGFR has been calculated using the CKD EPI equation. This calculation has not been validated in all clinical situations. eGFR's persistently <60 mL/min signify possible Chronic Kidney Disease.    Anion gap 11 5 - 15    Comment: Performed at [facility], [address] [place], [place] [address]  Lipase, blood     Status: None   Collection Time: 11/14/17 11:36 PM  Result Value Ref Range   Lipase 40 11 - 51 U/L    Comment: Performed at  [facility], [address] [place], [place] [address]  Troponin I     Status: None   Collection Time: 11/14/17 11:36 PM  Result Value Ref Range   Troponin I <0.03 <0.03 ng/mL    Comment: Performed at [facility], [address] [place], [place] [address]  CBC with Differential     Status: None   Collection Time: 11/14/17 11:36 PM  Result Value Ref Range   WBC 6.5 3.6 - 11.0 K/uL   RBC 4.31 3.80 - 5.20 MIL/uL   Hemoglobin 12.4 12.0 - 16.0 g/dL   HCT 37.2 35.0 - 47.0 %   MCV 86.5 80.0 - 100.0 fL   MCH 28.8 26.0 - 34.0 pg   MCHC 33.3 32.0 - 36.0 g/dL   RDW 13.8 11.5 - 14.5 %   Platelets 224 150 - 440 K/uL   Neutrophils Relative % 65 %   Neutro Abs 4.3 1.4 - 6.5 K/uL   Lymphocytes Relative 27 %   Lymphs Abs 1.7 1.0 - 3.6 K/uL   Monocytes Relative 6 %   Monocytes Absolute 0.4 0.2 - 0.9 K/uL   Eosinophils Relative 1 %   Eosinophils Absolute 0.0 0 - 0.7 K/uL   Basophils Relative 1 %   Basophils Absolute 0.0 0 - 0.1 K/uL    Comment: Performed at [facility], [address] [place], [place] [address]  Influenza panel by PCR (type A & B)     Status: Abnormal   Collection Time: 11/14/17 11:36 PM  Result Value Ref Range   Influenza A By PCR POSITIVE (A) NEGATIVE   Influenza B By PCR NEGATIVE NEGATIVE    Comment: (NOTE) The Xpert Xpress Flu assay is intended as an aid in the diagnosis of  influenza and should not be used as a sole basis for treatment.  This  assay is FDA approved for nasopharyngeal swab specimens only. Nasal  washings and aspirates are unacceptable for Xpert Xpress Flu testing. Performed at [facility], [address] [place], [place] [address]   TSH     Status: Abnormal   Collection Time: 11/14/17 11:36 PM  Result Value Ref Range   TSH 0.012 (L) 0.350 - 4.500 uIU/mL    Comment: Performed by a 3rd Generation assay with a functional sensitivity of <=0.01 uIU/mL. Performed at [facility], [address] [place], [place] [address]  T4, free     Status: None   Collection Time: 11/14/17 11:36 PM  Result Value Ref Range   Free T4 0.99 0.61 - 1.12 ng/dL    Comment: (NOTE) Biotin ingestion may interfere with free T4 tests. If the results are inconsistent with the TSH level, previous test results, or the clinical presentation, then consider biotin interference. If needed, order repeat testing after stopping biotin. Performed at [facility], [address] [place], [place] [address]   Urinalysis, Complete w Microscopic     Status: Abnormal   Collection Time: 11/15/17 12:32 AM  Result Value Ref Range   Color, Urine YELLOW (A) YELLOW   APPearance HAZY (A) CLEAR   Specific Gravity, Urine 1.021 1.005 - 1.030   pH 5.0 5.0 - 8.0   Glucose, UA NEGATIVE NEGATIVE mg/dL   Hgb urine dipstick NEGATIVE NEGATIVE   Bilirubin Urine NEGATIVE NEGATIVE   Ketones, ur 5 (A) NEGATIVE mg/dL   Protein, ur 100 (A) NEGATIVE mg/dL   Nitrite NEGATIVE NEGATIVE   Leukocytes, UA NEGATIVE NEGATIVE   RBC / HPF 0-5 0 - 5 RBC/hpf   WBC, UA 0-5 0 - 5 WBC/hpf   Bacteria, UA RARE (A) NONE SEEN   Squamous Epithelial / LPF 0-5 (A) NONE SEEN   Mucus PRESENT    Hyaline Casts, UA PRESENT     Comment: Performed at [facility], [address] [place], [place] [address]   Dg Chest 2 View  Result Date: 11/15/2017 CLINICAL DATA:  Fall EXAM: CHEST  2 VIEW COMPARISON:  11/10/2011 FINDINGS: Mild cardiomegaly with vascular congestion. No consolidation or pleural effusion. Aortic atherosclerosis. No pneumothorax. IMPRESSION: Cardiomegaly with mild central vascular congestion. No focal pulmonary infiltrate. Electronically Signed   By: Donavan Foil M.D.   On: 11/15/2017 00:33    Review of Systems  Constitutional: Negative for chills and fever.  HENT: Negative for sore throat and tinnitus.   Eyes: Negative for blurred vision and redness.  Respiratory: Negative for cough and shortness of breath.    Cardiovascular: Negative for chest pain, palpitations, orthopnea and PND.  Gastrointestinal: Positive for abdominal pain (Resolved), diarrhea, nausea and vomiting.  Genitourinary: Negative for dysuria, frequency and urgency.  Musculoskeletal: Negative for joint pain and myalgias.  Skin: Negative for rash.       No lesions  Neurological: Positive for weakness. Negative for speech change and focal weakness.  Endo/Heme/Allergies: Does not bruise/bleed easily.       No temperature intolerance  Psychiatric/Behavioral: Negative for depression and suicidal ideas.    Blood pressure 113/61, pulse 88, temperature 97.6 F (36.4 C), temperature source Oral, resp. rate 18, height 5' 6"  (1.676 m), weight 59.3 kg (130 lb 12.8 oz), SpO2 96 %. Physical Exam  Vitals reviewed. Constitutional: She is oriented to person, place, and time. She appears well-developed and well-nourished. No distress.  HENT:  Head: Normocephalic and atraumatic.  Mouth/Throat: Oropharynx is clear and moist.  Eyes: Conjunctivae and EOM are normal. Pupils are equal, round, and reactive to light.  Neck: Normal range of motion. No tracheal deviation present. No thyromegaly present.  Cardiovascular: Normal rate, regular rhythm and normal heart sounds. Exam reveals no gallop and no friction rub.  No murmur heard. Respiratory: Effort normal and breath sounds normal.  GI: Soft. Bowel sounds are normal. She exhibits no distension. There is no tenderness.  Lymphadenopathy:    She has no cervical adenopathy.  Neurological: She is alert and oriented to person, place, and time. No cranial  nerve deficit. She exhibits normal muscle tone.  Skin: Skin is warm and dry.  Psychiatric: She has a normal mood and affect. Judgment and thought content normal.     Assessment/Plan This is an 84 year old female admitted for atrial fibrillation with rapid ventricular rate. 1.  Atrial fibrillation: With rapid ventricular rate; now normal sinus rhythm.   Rate controlled.  Continue to monitor telemetry.  I have added aspirin to her regimen. 2.  Influenza: Continue Tamiflu 3.  Hypothyroidism: Check TSH.  Continue Synthroid 4.  Depression: The patient is vocal about not being happy with her life although she does not currently endorse suicidal ideation.  Continue Lexapro 5.  DVT prophylaxis: Lovenox 6.  GI prophylaxis: None The patient is a full code.  Time spent on admission orders and critical care approximately 45 minutes  Harrie Foreman, MD 11/15/2017, 3:02 AM

## 2017-11-15 NOTE — Care Management (Addendum)
Admitted from [facility] for rapid Atrial fib. Reaching out to [facility] to determine her level of care.  Converted to NSR in the ED after receiving dose of IV digoxin. There is no mention or documentation  of cardiology consult. Reached out to attending to determine if this is needed.  Patient is full code

## 2017-11-15 NOTE — Clinical Social Work Note (Signed)
Clinical Social Work Assessment  Patient Details  Name: Karen Colon MRN: [id number] Date of Birth: 1925/10/02  Date of referral:  11/15/17               Reason for consult:  Facility Placement                Permission sought to share information with:  Family Supports, Magazine features editoracility Contact Representative Permission granted to share information::  Yes, Verbal Permission Granted  Name::     Darnelle GoingJoyner, Ann Daughter   [phone]   Agency::    ALF admissions  Relationship::    Contact Information:     Housing/Transportation Living arrangements for the past 2 months:  Assisted Living Facility([facility]) Source of Information:  Patient, Medical Team Patient Interpreter Needed:  None Criminal Activity/Legal Involvement Pertinent to Current Situation/Hospitalization:  No - Comment as needed Significant Relationships:  Adult Children Lives with:  Facility Resident Do you feel safe going back to the place where you live?  Yes Need for family participation in patient care:  No (Coment)  Care giving concerns:Patient does not express any concerns about returning back to [facility].  Social Worker assessment / plan:  Patient is an 84 year old female who is alert and oriented x4.  Patient states she has been at [facility] for over two years and she has been pleased with it.  Patient states the people are very nice and treat her well.  Patient was explained the role of CSW and process for coordinating to get her back to ALF.  Patient was pleasant and talkative, she expressed appreciation for CSW speaking with her.  CSW was given permission to speak with ALF, she did not have any other questions.  Employment status:  Retired Database administratornsurance information:  Managed Medicare PT Recommendations:  Not assessed at this time Information / Referral to community resources:     Patient/Family's Response to care:  Patient is in agreement to returning back to [facility]. Patient/Family's Understanding of and  Emotional Response to Diagnosis, Current Treatment, and Prognosis:  Patient is aware of current treatment plan and prognosis.  Patient expressed that she is hopeful to return back to ALF soon, and hopes she will not have to be at hospital very long.  Emotional Assessment Appearance:  Appears stated age Attitude/Demeanor/Rapport:    Affect (typically observed):  Appropriate, Calm, Stable Orientation:  Oriented to Self, Oriented to Place, Oriented to  Time, Oriented to Situation Alcohol / Substance use:  Not Applicable Psych involvement (Current and /or in the community):  No (Comment)  Discharge Needs  Concerns to be addressed:  Care Coordination Readmission within the last 30 days:  No Current discharge risk:  None Barriers to Discharge:  Continued Medical Work up   [place] [employer], Opie Fanton R, LCSWA 11/15/2017, 5:55 PM

## 2017-11-16 ENCOUNTER — Inpatient Hospital Stay: Admit: 2017-11-16 | Payer: MEDICARE

## 2017-11-16 DIAGNOSIS — I480 Paroxysmal atrial fibrillation: Secondary | ICD-10-CM | POA: Diagnosis not present

## 2017-11-16 DIAGNOSIS — J101 Influenza due to other identified influenza virus with other respiratory manifestations: Secondary | ICD-10-CM | POA: Diagnosis not present

## 2017-11-16 LAB — BASIC METABOLIC PANEL
Anion gap: 5 (ref 5–15)
BUN: 11 mg/dL (ref 6–20)
CALCIUM: 8.1 mg/dL — AB (ref 8.9–10.3)
CO2: 26 mmol/L (ref 22–32)
Chloride: 105 mmol/L (ref 101–111)
Creatinine, Ser: 0.66 mg/dL (ref 0.44–1.00)
Glucose, Bld: 95 mg/dL (ref 65–99)
Potassium: 4.2 mmol/L (ref 3.5–5.1)
Sodium: 136 mmol/L (ref 135–145)

## 2017-11-16 LAB — MAGNESIUM: Magnesium: 2.1 mg/dL (ref 1.7–2.4)

## 2017-11-16 MED ORDER — GUAIFENESIN ER 600 MG PO TB12: 600.0000 mg | ORAL_TABLET | Freq: Two times a day (BID) | ORAL | 0 refills | Status: AC

## 2017-11-16 MED ORDER — DILTIAZEM HCL ER COATED BEADS 120 MG PO CP24: 120.0000 mg | ORAL_CAPSULE | Freq: Every day | ORAL | 0 refills | Status: DC

## 2017-11-16 MED ORDER — OSELTAMIVIR PHOSPHATE 30 MG PO CAPS: 30.0000 mg | ORAL_CAPSULE | Freq: Two times a day (BID) | ORAL | 0 refills | Status: AC

## 2017-11-16 MED ORDER — GUAIFENESIN-DM 100-10 MG/5ML PO SYRP: 15.0000 mL | ORAL_SOLUTION | ORAL | 0 refills | Status: DC | PRN

## 2017-11-16 MED ORDER — ASPIRIN EC 325 MG PO TBEC: 325.0000 mg | DELAYED_RELEASE_TABLET | Freq: Every day | ORAL | 3 refills | Status: DC

## 2017-11-16 MED ORDER — GUAIFENESIN ER 600 MG PO TB12
600.0000 mg | ORAL_TABLET | Freq: Two times a day (BID) | ORAL | Status: DC
  Administered 2017-11-16: 600 mg via ORAL
  Filled 2017-11-16: qty 1

## 2017-11-16 NOTE — Clinical Social Work Note (Addendum)
11:50am  CSW attempted to contact patient's daughter Dewayne Hatchnn [phone] to inform her patient is ready for discharge.  CSW spoke to [facility], and they said daughter was going to transport, awaiting for call back.  1:00pm CSW attempted to call daughter again left another message awaiting for call back.  1:15pm  CSW received phone call back from patient's daughter, she will be at hospital around 2 or 2:30 to pick patient up, CSW updated bedside nurse.   Patient to be d[facility]ed today [facility].  Patient and family agreeable to plans will transport via daughter's car RN to call report.  Windell MouldingEric Eilene Voigt, MSW, Theresia MajorsLCSWA [phone]

## 2017-11-16 NOTE — Progress Notes (Signed)
Attempted to call report to facility nurse at this time. Left message w/ call back number. Daughter to pick patient up and transport to the facility here in a little while. Patient ready to transport at this time. Jari FavreSteven M [facility]

## 2017-11-16 NOTE — Discharge Instructions (Signed)
[facility] [place] [facility]  DIET:  Cardiac diet  DISCHARGE CONDITION:  Stable  ACTIVITY:  Activity as tolerated  OXYGEN:  Home Oxygen: No.   Oxygen Delivery: room air  DISCHARGE LOCATION:  Assisted living    ADDITIONAL DISCHARGE INSTRUCTION:   If you experience worsening of your admission symptoms, develop shortness of breath, life threatening emergency, suicidal or homicidal thoughts you must seek medical attention immediately by calling 911 or calling your MD immediately  if symptoms less severe.  You Must read complete instructions/literature along with all the possible adverse reactions/side effects for all the Medicines you take and that have been prescribed to you. Take any new Medicines after you have completely understood and accpet all the possible adverse reactions/side effects.   Please note  You were cared for by a hospitalist during your hospital stay. If you have any questions about your discharge medications or the care you received while you were in the hospital after you are discharged, you can call the unit and asked to speak with the hospitalist on call if the hospitalist that took care of you is not available. Once you are discharged, your primary care physician will handle any further medical issues. Please note that NO REFILLS for any discharge medications will be authorized once you are discharged, as it is imperative that you return to your primary care physician (or establish a relationship with a primary care physician if you do not have one) for your aftercare needs so that they can reassess your need for medications and monitor your lab values.

## 2017-11-16 NOTE — Discharge Summary (Signed)
[facility] [place] [facility]  Nikeya Josey, 84 y.o., DOB [birth year], MRN [id number]. Admission date: 11/14/2017 Discharge Date 11/16/2017 Primary MD Karie SchwalbeLetvak, Richard I, MD Admitting Physician Arnaldo NatalMichael S Diamond, MD  Admission Diagnosis  Influenza A [J10.1] Atrial fibrillation with RVR [facility](HCC) [I48.91]  Discharge Diagnosis   Active Problems:   Atrial fibrillation with RVR (HCC) Influenza A Hypothyroidism Essential hypertension History of vitamin D deficiency        Hospital Course Complaint: Fall HPI: The patient with past medical history of paroxysmal atrial fibrillation and hypertension presents the emergency department from her nursing home after a fall.  It is unclear if the patient lost consciousness.  Patient also was complaining of some cough.  She was noted to have influenza A.  Patient was admitted for A. fib with RVR.  She does have a history of paroxysmal atrial fibrillation.  Patient is started on a Cardizem CD.  Also treated with aspirin.  She is not a good candidate for full dose anticoagulation.  Patient is very anxious to go home today.              Consults  cardiology  Significant Tests:  See full reports for all details     Dg Chest 2 View  Result Date: 11/15/2017 CLINICAL DATA:  Fall EXAM: CHEST  2 VIEW COMPARISON:  11/10/2011 FINDINGS: Mild cardiomegaly with vascular congestion. No consolidation or pleural effusion. Aortic atherosclerosis. No pneumothorax. IMPRESSION: Cardiomegaly with mild central vascular congestion. No focal pulmonary infiltrate. Electronically Signed   By: Jasmine PangKim  Fujinaga M.D.   On: 11/15/2017 00:33       Today   Subjective:   Marnee SpringKaryn Barra feeling much better wants to go home has some cough Objective:   Blood pressure (!) 150/80, pulse 66, temperature 98 F (36.7 C), temperature source Oral, resp. rate 18, height 5\' 6"  (1.676 m), weight 128 lb 9.6 oz (58.3 kg), SpO2 98 %.  .  Intake/Output Summary (Last  24 hours) at 11/16/2017 0921 Last data filed at 11/16/2017 0840 Gross per 24 hour  Intake 120 ml  Output 875 ml  Net -755 ml    Exam VITAL SIGNS: Blood pressure (!) 150/80, pulse 66, temperature 98 F (36.7 C), temperature source Oral, resp. rate 18, height 5\' 6"  (1.676 m), weight 128 lb 9.6 oz (58.3 kg), SpO2 98 %.  GENERAL:  84 y.o.-year-old patient lying in the bed with no acute distress.  EYES: Pupils equal, round, reactive to light and accommodation. No scleral icterus. Extraocular muscles intact.  HEENT: Head atraumatic, normocephalic. Oropharynx and nasopharynx clear.  NECK:  Supple, no jugular venous distention. No thyroid enlargement, no tenderness.  LUNGS: Normal breath sounds bilaterally, no wheezing, rales,rhonchi or crepitation. No use of accessory muscles of respiration.  CARDIOVASCULAR: S1, S2 normal. No murmurs, rubs, or gallops.  ABDOMEN: Soft, nontender, nondistended. Bowel sounds present. No organomegaly or mass.  EXTREMITIES: No pedal edema, cyanosis, or clubbing.  NEUROLOGIC: Cranial nerves II through XII are intact. Muscle strength 5/5 in all extremities. Sensation intact. Gait not checked.  PSYCHIATRIC: The patient is alert and oriented x 3.  SKIN: No obvious rash, lesion, or ulcer.   Data Review     CBC w Diff:  Lab Results  Component Value Date   WBC 6.5 11/14/2017   HGB 12.4 11/14/2017   HGB 12.6 11/10/2011   HCT 37.2 11/14/2017   HCT 37.9 11/10/2011   PLT 224 11/14/2017   PLT 271 11/10/2011   LYMPHOPCT 27  11/14/2017   MONOPCT 6 11/14/2017   EOSPCT 1 11/14/2017   BASOPCT 1 11/14/2017   CMP:  Lab Results  Component Value Date   NA 136 11/16/2017   NA 144 11/10/2011   K 4.2 11/16/2017   K 4.1 11/10/2011   CL 105 11/16/2017   CL 107 11/10/2011   CO2 26 11/16/2017   CO2 26 11/10/2011   BUN 11 11/16/2017   BUN 16 11/10/2011   CREATININE 0.66 11/16/2017   CREATININE 0.58 06/09/2012   PROT 5.7 (L) 11/14/2017   PROT 6.6 11/10/2011   ALBUMIN  3.2 (L) 11/14/2017   ALBUMIN 3.5 11/10/2011   BILITOT 0.4 11/14/2017   BILITOT 0.3 11/10/2011   ALKPHOS 77 11/14/2017   ALKPHOS 76 11/10/2011   AST 34 11/14/2017   AST 23 11/10/2011   ALT 18 11/14/2017   ALT 21 11/10/2011  .  Micro Results Recent Results (from the past 240 hour(s))  MRSA PCR Screening     Status: None   Collection Time: 11/15/17  2:54 AM  Result Value Ref Range Status   MRSA by PCR NEGATIVE NEGATIVE Final    Comment:        The GeneXpert MRSA Assay (FDA approved for NASAL specimens only), is one component of a comprehensive MRSA colonization surveillance program. It is not intended to diagnose MRSA infection nor to guide or monitor treatment for MRSA infections. Performed at [facility], [address] [place], [place] [address]         Code Status Orders  (From admission, onward)        Start     Ordered   11/15/17 6045  Do not attempt resuscitation (DNR)  Continuous    Question Answer Comment  In the event of cardiac or respiratory ARREST Do not call a "code blue"   In the event of cardiac or respiratory ARREST Do not perform Intubation, CPR, defibrillation or ACLS   In the event of cardiac or respiratory ARREST Use medication by any route, position, wound care, and other measures to relive pain and suffering. May use oxygen, suction and manual treatment of airway obstruction as needed for comfort.      11/15/17 0933    Code Status History    Date Active Date Inactive Code Status Order ID Comments User Context   11/15/2017 02:51 11/15/2017 09:33 Full Code [id number]  Arnaldo Natal, MD Inpatient    Advance Directive Documentation     Most Recent Value  Type of Advance Directive  Healthcare Power of Attorney, Living will  Pre-existing out of facility DNR order (yellow form or pink MOST form)  No data  "MOST" Form in Place?  No data          Follow-up Information    Tillman Abide I, MD Follow up in 1 week(s).    Specialties:  Internal Medicine, Pediatrics Contact information: [address] [place] [address] [phone]           Discharge Medications   Allergies as of 11/16/2017      Reactions   Ciprofloxacin    Muscle spasm   Iodinated Diagnostic Agents    Iodine DYe   Other    Dust Mites      Medication List    TAKE these medications   acidophilus Caps capsule Take 1 capsule by mouth daily.   albuterol 108 (90 Base) MCG/ACT inhaler Commonly known as:  PROVENTIL HFA;VENTOLIN HFA Inhale 2 puffs into the lungs  every 6 (six) hours as needed for wheezing or shortness of breath.   aspirin EC 325 MG tablet Take 1 tablet (325 mg total) by mouth daily.   cetirizine 10 MG tablet Commonly known as:  ZYRTEC Take 10 mg by mouth at bedtime.   diltiazem 120 MG 24 hr capsule Commonly known as:  CARDIZEM CD Take 1 capsule (120 mg total) by mouth daily.   escitalopram 5 MG tablet Commonly known as:  LEXAPRO Take 1 tablet (5 mg total) by mouth daily.   fluticasone 50 MCG/ACT nasal spray Commonly known as:  FLONASE Place 1 spray into both nostrils daily.   guaiFENesin 600 MG 12 hr tablet Commonly known as:  MUCINEX Take 1 tablet (600 mg total) by mouth 2 (two) times daily for 5 days.   guaiFENesin-dextromethorphan 100-10 MG/5ML syrup Commonly known as:  ROBITUSSIN DM Take 15 mLs by mouth every 4 (four) hours as needed for cough.   oseltamivir 30 MG capsule Commonly known as:  TAMIFLU Take 1 capsule (30 mg total) by mouth 2 (two) times daily for 4 days. What changed:    medication strength  how much to take  when to take this   thyroid 90 MG tablet Commonly known as:  ARMOUR Take 90 mg by mouth daily. At 7am   traZODone 100 MG tablet Commonly known as:  DESYREL Take 100 mg by mouth at bedtime as needed for sleep.   traZODone 50 MG tablet Commonly known as:  DESYREL Take 1-2 tablets (50-100 mg total) by mouth at bedtime.          Total Time  in preparing paper work, data evaluation and todays exam - 35 minutes  Auburn Bilberry M.D on 11/16/2017 at 9:21 AM  [facility]   Office  [phone]

## 2017-11-16 NOTE — Care Management CC44 (Signed)
Condition Code 44 Documentation Completed  Patient Details  Name: Karen SpringKaryn Stumpe MRN: [id number] Date of Birth: 1926-05-26   Condition Code 44 given:  (No) Patient signature on Condition Code 44 notice:  (No) Documentation of 2 MD's agreement:  (No) Code 44 added to claim:  (No) Mrs Karen Colon was already discharged and out of the hospital when this writer went to give her the Code 44 notification.    Maryellen Dowdle A, RN 11/16/2017, 3:05 PM

## 2017-11-16 NOTE — NC FL2 (Signed)
[place] MEDICAID FL2 LEVEL OF CARE SCREENING TOOL     IDENTIFICATION  Patient Name: Karen SpringKaryn Colon Birthdate: [birth year] Sex: female Admission Date (Current Location): 11/14/2017  [place]   Facility and Address:  [facility], [address], [place], [place] [address]      Provider Number: [id number]  Attending Physician Name and Address:  Auburn BilberryPatel, Shreyang, MD  Relative Name and Phone Number:  Darnelle GoingJoyner, Ann Daughter   [phone]     Current Level of Care: Hospital Recommended Level of Care: Assisted Living Facility([facility]) Prior Approval Number:    Date Approved/Denied:   PASRR Number:    Discharge Plan: Other (Comment)([facility])    Current Diagnoses: Patient Active Problem List   Diagnosis Date Noted  . Atrial fibrillation with RVR (HCC) 11/15/2017  . Lower abdominal pain 08/22/2017  . Mood disorder (HCC) 02/07/2017  . Mild cognitive impairment 06/25/2012  . Hypothyroidism 06/09/2012  . Allergic rhinitis 06/09/2012  . Atrial fibrillation (HCC) 06/12/2011  . HYPERTENSION, BENIGN 03/14/2009    Orientation RESPIRATION BLADDER Height & Weight     Situation, Place, Self  Normal Continent Weight: 128 lb 9.6 oz (58.3 kg) Height:  5\' 6"  (167.6 cm)  BEHAVIORAL SYMPTOMS/MOOD NEUROLOGICAL BOWEL NUTRITION STATUS      Continent Diet  AMBULATORY STATUS COMMUNICATION OF NEEDS Skin   Supervision Verbally Normal                       Personal Care Assistance Level of Assistance  Bathing, Feeding, Dressing Bathing Assistance: Limited assistance Feeding assistance: Limited assistance Dressing Assistance: Limited assistance     Functional Limitations Info  Sight, Hearing, Speech Sight Info: Adequate Hearing Info: Impaired Speech Info: Adequate    SPECIAL CARE FACTORS FREQUENCY                       Contractures Contractures Info: Not present    Additional Factors Info  Code Status,  Allergies, Psychotropic Code Status Info: DNR Allergies Info: CIPROFLOXACIN, IODINATED DIAGNOSTIC AGENTS, OTHER  Psychotropic Info: escitalopram (LEXAPRO) tablet 5 mg          Current Medications (11/16/2017):  This is the current hospital active medication list Current Facility-Administered Medications  Medication Dose Route Frequency Provider Last Rate Last Dose  . acetaminophen (TYLENOL) tablet 650 mg  650 mg Oral Q6H PRN Arnaldo Nataliamond, Michael S, MD       Or  . acetaminophen (TYLENOL) suppository 650 mg  650 mg Rectal Q6H PRN Arnaldo Nataliamond, Michael S, MD      . acidophilus (RISAQUAD) capsule 1 capsule  1 capsule Oral Daily Arnaldo Nataliamond, Michael S, MD   1 capsule at 11/16/17 0931  . albuterol (PROVENTIL) (2.5 MG/3ML) 0.083% nebulizer solution 2.5 mg  2.5 mg Nebulization Q4H PRN Arnaldo Nataliamond, Michael S, MD      . aspirin EC tablet 81 mg  81 mg Oral Daily Arnaldo Nataliamond, Michael S, MD   81 mg at 11/16/17 0931  . diltiazem (CARDIZEM) tablet 30 mg  30 mg Oral Q6H PRN Katha HammingKonidena, Snehalatha, MD      . docusate sodium (COLACE) capsule 100 mg  100 mg Oral BID Arnaldo Nataliamond, Michael S, MD   100 mg at 11/16/17 0931  . enoxaparin (LOVENOX) injection 40 mg  40 mg Subcutaneous Q24H Arnaldo Nataliamond, Michael S, MD   40 mg at 11/15/17 2118  . escitalopram (LEXAPRO) tablet 5 mg  5 mg Oral Daily Arnaldo Nataliamond, Michael S,  MD   5 mg at 11/15/17 0838  . fluticasone (FLONASE) 50 MCG/ACT nasal spray 1 spray  1 spray Each Nare Daily Arnaldo Natal, MD   1 spray at 11/16/17 0932  . guaiFENesin (MUCINEX) 12 hr tablet 600 mg  600 mg Oral BID [place] Bilberry, MD      . loratadine (CLARITIN) tablet 10 mg  10 mg Oral Daily Arnaldo Natal, MD   10 mg at 11/16/17 0932  . ondansetron (ZOFRAN) tablet 4 mg  4 mg Oral Q6H PRN Arnaldo Natal, MD       Or  . ondansetron [facility]) injection 4 mg  4 mg Intravenous Q6H PRN Arnaldo Natal, MD      . oseltamivir (TAMIFLU) capsule 30 mg  30 mg Oral BID Arnaldo Natal, MD   30 mg at 11/16/17 0932  . thyroid  (ARMOUR) tablet 90 mg  90 mg Oral Daily Arnaldo Natal, MD   90 mg at 11/16/17 0931  . traZODone (DESYREL) tablet 100 mg  100 mg Oral QHS PRN Arnaldo Natal, MD         Discharge Medications: acidophilus Caps capsule Take 1 capsule by mouth daily.   albuterol 108 (90 Base) MCG/ACT inhaler Commonly known as:  PROVENTIL HFA;VENTOLIN HFA Inhale 2 puffs into the lungs every 6 (six) hours as needed for wheezing or shortness of breath.   aspirin EC 325 MG tablet Take 1 tablet (325 mg total) by mouth daily.   cetirizine 10 MG tablet Commonly known as:  ZYRTEC Take 10 mg by mouth at bedtime.   diltiazem 120 MG 24 hr capsule Commonly known as:  CARDIZEM CD Take 1 capsule (120 mg total) by mouth daily.   escitalopram 5 MG tablet Commonly known as:  LEXAPRO Take 1 tablet (5 mg total) by mouth daily.   fluticasone 50 MCG/ACT nasal spray Commonly known as:  FLONASE Place 1 spray into both nostrils daily.   guaiFENesin 600 MG 12 hr tablet Commonly known as:  MUCINEX Take 1 tablet (600 mg total) by mouth 2 (two) times daily for 5 days.   guaiFENesin-dextromethorphan 100-10 MG/5ML syrup Commonly known as:  ROBITUSSIN DM Take 15 mLs by mouth every 4 (four) hours as needed for cough.   oseltamivir 30 MG capsule Commonly known as:  TAMIFLU Take 1 capsule (30 mg total) by mouth 2 (two) times daily for 4 days. What changed:    medication strength  how much to take  when to take this   thyroid 90 MG tablet Commonly known as:  ARMOUR Take 90 mg by mouth daily. At 7am   traZODone 100 MG tablet Commonly known as:  DESYREL Take 100 mg by mouth at bedtime as needed for sleep.   traZODone 50 MG tablet Commonly known as:  DESYREL Take 1-2 tablets (50-100 mg total) by mouth at bedtime.      Relevant Imaging Results:  Relevant Lab Results:   Additional Information SSN [id number]  Karen Colon, [place]

## 2017-11-16 NOTE — Progress Notes (Signed)
PHARMACY - ELECTROLYTE MANAGEMENT PROGRESS NOTE  Pharmacy Consult for Electrolyte Management Indication: Hypokalemia   Allergies  Allergen Reactions  . Ciprofloxacin     Muscle spasm   . Iodinated Diagnostic Agents     Iodine DYe  . Other     Dust Mites    Patient Measurements: Height: 5\' 6"  (167.6 cm) Weight: 128 lb 9.6 oz (58.3 kg) IBW/kg (Calculated) : 59.3 Adjusted Body Weight:    Vital Signs: Temp: 98 F (36.7 C) (02/23 0748) Temp Source: Oral (02/23 0748) BP: 150/80 (02/23 0748) Pulse Rate: 66 (02/23 0748) Intake/Output from previous day: 02/22 0701 - 02/23 0700 In: 120 [P.O.:120] Out: 575 [Urine:575] Intake/Output from this shift: No intake/output data recorded. Vent settings for last 24 hours:    Labs: Recent Labs    11/14/17 2336 11/16/17 0614  WBC 6.5  --   HGB 12.4  --   HCT 37.2  --   PLT 224  --   CREATININE 0.74 0.66  MG  --  2.1  ALBUMIN 3.2*  --   PROT 5.7*  --   AST 34  --   ALT 18  --   ALKPHOS 77  --   BILITOT 0.4  --    Estimated Creatinine Clearance: 43.9 mL/min (by C-G formula based on SCr of 0.66 mg/dL).  No results for input(s): GLUCAP in the last 72 hours.  Microbiology: Recent Results (from the past 720 hour(s))  MRSA PCR Screening     Status: None   Collection Time: 11/15/17  2:54 AM  Result Value Ref Range Status   MRSA by PCR NEGATIVE NEGATIVE Final    Comment:        The GeneXpert MRSA Assay (FDA approved for NASAL specimens only), is one component of a comprehensive MRSA colonization surveillance program. It is not intended to diagnose MRSA infection nor to guide or monitor treatment for MRSA infections. Performed at [facility], [address] [place], [place] [address]     Assessment: Patient is a 84yo female admitted for afib, found to be Influenza type B positive. Pharmacy consulted for electrolyte management.  Goal of Therapy:  K>4 Mg>2  Plan:  K=4.2 Mg =2.1 No supplementation  needed Recheck K in the AM  Jacoba Cherney D Kassadee Carawan, Pharm.D, BCPS Clinical Pharmacist  11/16/2017 8:12 AM

## 2017-11-16 NOTE — Care Management Note (Addendum)
Case Management Note  Patient Details  Name: Karen Colon MRN: [id number] Date of Birth: [birth year]  Subjective/Objective:    OBS notice MOON form was not given. UR notified this Clinical research associatewriter of a Potential code 44 at 11:20am.  UR notified this Clinical research associatewriter at 14:20pm today of a new Code 44. At 14:40pm the patient status was changed from Inpatient to Observation. At 14:50pm patient's room was empty. Per her chart Mrs Karen Colon was discharged with her daughter at 14:42pm.  Therefore, Mrs Karen Colon had left the hospital prior to receiving a [place] 44 notification.               Action/Plan:   Expected Discharge Date:  11/16/17               Expected Discharge Plan:     In-House Referral:     Discharge planning Services     Post Acute Care Choice:    Choice offered to:     DME Arranged:    DME Agency:     HH Arranged:    HH Agency:     Status of Service:     If discussed at [employer] of [employer] Meetings, dates discussed:    Additional Comments:  Onica Davidovich A, RN 11/16/2017, 2:58 PM

## 2017-11-16 NOTE — Plan of Care (Signed)
  Progressing Clinical Measurements: Cardiovascular complication will be avoided 11/16/2017 0117 - Progressing by Myles GipKimrey, Melania Kirks M, RN Coping: Level of anxiety will decrease 11/16/2017 0117 - Progressing by Myles GipKimrey, Laural Eiland M, RN Pain Managment: General experience of comfort will improve 11/16/2017 0117 - Progressing by Myles GipKimrey, Shayona Hibbitts M, RN Safety: Ability to remain free from injury will improve 11/16/2017 0117 - Progressing by Myles GipKimrey, Tahesha Skeet M, RN

## 2017-11-20 ENCOUNTER — Ambulatory Visit: Payer: Medicare HMO | Admitting: Internal Medicine

## 2017-11-20 VITALS — BP 122/76 | HR 74

## 2017-11-20 DIAGNOSIS — J111 Influenza due to unidentified influenza virus with other respiratory manifestations: Secondary | ICD-10-CM | POA: Diagnosis not present

## 2017-11-20 DIAGNOSIS — M79622 Pain in left upper arm: Secondary | ICD-10-CM | POA: Diagnosis not present

## 2017-11-20 DIAGNOSIS — I4891 Unspecified atrial fibrillation: Secondary | ICD-10-CM | POA: Diagnosis not present

## 2017-11-20 DIAGNOSIS — W19XXXD Unspecified fall, subsequent encounter: Secondary | ICD-10-CM | POA: Diagnosis not present

## 2017-11-20 DIAGNOSIS — Y92129 Unspecified place in nursing home as the place of occurrence of the external cause: Secondary | ICD-10-CM | POA: Diagnosis not present

## 2017-11-21 ENCOUNTER — Other Ambulatory Visit: Payer: Self-pay | Admitting: *Deleted

## 2017-11-21 ENCOUNTER — Telehealth: Payer: Self-pay | Admitting: Cardiovascular Disease

## 2017-11-21 NOTE — Telephone Encounter (Signed)
Pt daughter calling stating pt was in hospital for HR being 130-150 and she had the flu They admitted patient into hospital. Patient was discharged and is at [facility]. Pt states she does not want to go the hospital for any reason, states patient is complaining of "Chest hurting" daughter states she is not sure if this is by her coughing so much or if she is still fighting the flu. Pt has said she is not wanting to go to hospital for any reason, she said to daughter "If I am going to die, I will die at home"  She is asking for advise on this Please call back

## 2017-11-21 NOTE — Telephone Encounter (Signed)
Patient recently in hospital for flu and atrial fib with RVR. Daughter says PCP saw her yesterday and listened and patient HR was WNL. PCP thinks the chest discomfort is from coughing. Patient still feels there is something wrong with her heart and would like to be seen.  Patient's daughter would like patient to be seen by Dr Mariah MillingGollan. She last saw him 12/11/16, about 1 year ago. Patient is scheduled to see Gollan on 11/26/17.

## 2017-11-24 NOTE — Progress Notes (Deleted)
Cardiology Office Note  Date:  11/24/2017   ID:  Karen Colon, DOB 1925-12-18, MRN [id number]  PCP:  Karie Schwalbe, MD   No chief complaint on file.   HPI:  84 y/o with h/o  HTN,  HL  PAF,  memory problems, prior episodes of chest pain,  who presents for routine followup of her chest pain. Previous  Echo with normal LV function   cardiac MRI  was normal 2010 with no mention of coronary artery disease.  She currently lives in [place], independent living  In follow-up today she presents with caretaker from [facility] [place] Having memory issues Denies ever having diarrhea episodes, not an active issue it would seem Denies having any chest pain or other issues, denies shortness of breath Some gait instability but no recent falls Has not seen primary care in 2.5 years No recent lab work for review Unable to have her dog at [facility] [place], makes her sad. Used to walk with her dog No lower extremity edema.   EKG on today's visit shows normal sinus rhythm with rate 84 bpm, no significant ST or T-wave changes  Previously had problems with chronic diarrhea. This is an occasional problem She's had depression In the past   Holter Monitor  In the past showing 3 brief runs of SVT (5 beats) no  AF.  This was done in May 2014      PMH:   has a past medical history of Allergic rhinitis, Depressive disorder, not elsewhere classified, Hearing loss, History of MRI (9/10), Hyperlipidemia, Hypertension, Hypothyroidism, Insomnia, unspecified, Osteopenia, Paroxysmal A-fib (HCC) (2009), Teeth grinding, and Unspecified vitamin D deficiency.  PSH:    Past Surgical History:  Procedure Laterality Date  . APPENDECTOMY  2009  . TONSILLECTOMY      Current Outpatient Medications  Medication Sig Dispense Refill  . acidophilus (RISAQUAD) CAPS capsule Take 1 capsule by mouth daily.    Marland Kitchen albuterol (PROVENTIL HFA;VENTOLIN HFA) 108 (90 Base) MCG/ACT inhaler Inhale 2 puffs into the lungs every 6 (six) hours  as needed for wheezing or shortness of breath. 1 Inhaler 0  . aspirin EC 325 MG tablet Take 1 tablet (325 mg total) by mouth daily. 100 tablet 3  . cetirizine (ZYRTEC) 10 MG tablet Take 10 mg by mouth at bedtime.    Marland Kitchen diltiazem (CARDIZEM CD) 120 MG 24 hr capsule Take 1 capsule (120 mg total) by mouth daily. 30 capsule 0  . escitalopram (LEXAPRO) 5 MG tablet Take 1 tablet (5 mg total) by mouth daily. 30 tablet 2  . fluticasone (FLONASE) 50 MCG/ACT nasal spray Place 1 spray into both nostrils daily.    Marland Kitchen guaiFENesin-dextromethorphan (ROBITUSSIN DM) 100-10 MG/5ML syrup Take 15 mLs by mouth every 4 (four) hours as needed for cough. 118 mL 0  . thyroid (ARMOUR) 90 MG tablet Take 90 mg by mouth daily. At 7am    . traZODone (DESYREL) 100 MG tablet Take 100 mg by mouth at bedtime as needed for sleep.    . traZODone (DESYREL) 50 MG tablet Take 1-2 tablets (50-100 mg total) by mouth at bedtime. (Patient not taking: Reported on 11/15/2017) 60 tablet 11   No current facility-administered medications for this visit.      Allergies:   Ciprofloxacin; Iodinated diagnostic agents; and Other   Social History:  The patient  reports that  has never smoked. she has never used smokeless tobacco. She reports that she drinks about 0.6 oz of alcohol per week. She reports that  she does not use drugs.   Family History:   family history includes Heart disease in her mother; Pneumonia in her father.    Review of Systems: Review of Systems  Constitutional: Negative.   Respiratory: Negative.   Cardiovascular: Negative.   Gastrointestinal: Negative.   Musculoskeletal: Negative.   Neurological: Negative.   Psychiatric/Behavioral: Negative.   All other systems reviewed and are negative.    PHYSICAL EXAM: VS:  There were no vitals taken for this visit. , BMI There is no height or weight on file to calculate BMI. GEN: Well nourished, well developed, in no acute distress  HEENT: normal  Neck: no JVD, carotid  bruits, or masses Cardiac: RRR; no murmurs, rubs, or gallops,no edema  Respiratory:  clear to auscultation bilaterally, normal work of breathing GI: soft, nontender, nondistended, + BS MS: no deformity or atrophy  Skin: warm and dry, no rash Neuro:  Strength and sensation are intact Psych: euthymic mood, full affect, Memory issues    Recent Labs: 11/14/2017: ALT 18; Hemoglobin 12.4; Platelets 224; TSH 0.012 11/16/2017: BUN 11; Creatinine, Ser 0.66; Magnesium 2.1; Potassium 4.2; Sodium 136    Lipid Panel No results found for: CHOL, HDL, LDLCALC, TRIG    Wt Readings from Last 3 Encounters:  11/16/17 128 lb 9.6 oz (58.3 kg)  08/22/17 136 lb (61.7 kg)  05/22/17 125 lb (56.7 kg)       ASSESSMENT AND PLAN:  HYPERTENSION, BENIGN - Plan: EKG 12-Lead Blood pressure is well controlled on today's visit. Currently not on any medications Weight is stable  Chest pain, unspecified type - Plan: EKG 12-Lead Denies any significant chest pain, active at baseline with no symptoms No further testing needed Previous workup showing no coronary disease, Suspect Previous chest pain was atypical in nature  Memory change - Plan: EKG 12-Lead Poor historian, denies any problems, does not remember ever having diarrhea issues Does not remember who her primary care has been in the past, does not remember ever seeing Dr. Burnadette PopLinthavong   Paroxysmal atrial fibrillation [facility](HCC) - Plan: EKG 12-Lead Long discussion with her, we will review previous records Unable to identify when she had paroxysmal atrial fibrillation   Total encounter time more than 25 minutes  Greater than 50% was spent in counseling and coordination of care with the patient   Disposition:   F/U  12 months As needed   No orders of the defined types were placed in this encounter.    Signed, Dossie Arbourim Gollan, M.D., Ph.D. 11/24/2017  [facility], [place] [phone]

## 2017-11-26 ENCOUNTER — Ambulatory Visit: Payer: Medicare HMO | Admitting: Cardiovascular Disease

## 2017-11-29 ENCOUNTER — Encounter: Payer: Self-pay | Admitting: Internal Medicine

## 2017-11-29 NOTE — Progress Notes (Signed)
Subjective:    Patient ID: Karen Colon, female    DOB: 1926-05-10, 85 y.o.   MRN: [id number]  HPI  Asked to check resident Sent to ER 2/21 after a fall. Also c/o cough. Positive for Influenza A. During admission, went into Afib with RVR. She was treated with Tamiflu, Cardizem and ASA. Since returning to ALF, she reports that she is very weak, still coughing but not SOB. She has not noticed that her heart has been beating abnormally. She reports left sided chest pain but upon further questioning, the pain is in her left armpit.  RN reports she has not run fevers, but no other concerns. Wants to know if she should follow up with cardiology.  Review of Systems      Past Medical History:  Diagnosis Date  . Allergic rhinitis   . Depressive disorder, not elsewhere classified   . Hearing loss    bilateral hearing aides  . History of MRI 9/10   Cardiac MRI, normal  . Hyperlipidemia   . Hypertension   . Hypothyroidism   . Insomnia, unspecified   . Osteopenia   . Paroxysmal A-fib (HCC) 2009   [facility]  . Teeth grinding   . Unspecified vitamin D deficiency     Current Outpatient Medications  Medication Sig Dispense Refill  . acidophilus (RISAQUAD) CAPS capsule Take 1 capsule by mouth daily.    Marland Kitchen albuterol (PROVENTIL HFA;VENTOLIN HFA) 108 (90 Base) MCG/ACT inhaler Inhale 2 puffs into the lungs every 6 (six) hours as needed for wheezing or shortness of breath. 1 Inhaler 0  . aspirin EC 325 MG tablet Take 1 tablet (325 mg total) by mouth daily. 100 tablet 3  . cetirizine (ZYRTEC) 10 MG tablet Take 10 mg by mouth at bedtime.    Marland Kitchen diltiazem (CARDIZEM CD) 120 MG 24 hr capsule Take 1 capsule (120 mg total) by mouth daily. 30 capsule 0  . escitalopram (LEXAPRO) 5 MG tablet Take 1 tablet (5 mg total) by mouth daily. 30 tablet 2  . fluticasone (FLONASE) 50 MCG/ACT nasal spray Place 1 spray into both nostrils daily.    Marland Kitchen guaiFENesin-dextromethorphan (ROBITUSSIN DM) 100-10 MG/5ML syrup Take 15 mLs  by mouth every 4 (four) hours as needed for cough. 118 mL 0  . thyroid (ARMOUR) 90 MG tablet Take 90 mg by mouth daily. At 7am    . traZODone (DESYREL) 100 MG tablet Take 100 mg by mouth at bedtime as needed for sleep.    . traZODone (DESYREL) 50 MG tablet Take 1-2 tablets (50-100 mg total) by mouth at bedtime. (Patient not taking: Reported on 11/15/2017) 60 tablet 11   No current facility-administered medications for this visit.     Allergies  Allergen Reactions  . Ciprofloxacin     Muscle spasm   . Iodinated Diagnostic Agents     Iodine DYe  . Other     Dust Mites    Family History  Problem Relation Age of Onset  . Heart disease Mother   . Pneumonia Father     Social History   Socioeconomic History  . Marital status: Married    Spouse name: Not on file  . Number of children: 2  . Years of education: Not on file  . Highest education level: Not on file  Social Needs  . Financial resource strain: Not on file  . Food insecurity - worry: Not on file  . Food insecurity - inability: Not on file  . Transportation needs - medical:  Not on file  . Transportation needs - non-medical: Not on file  Occupational History  . Occupation: Psychologist, forensic    Comment: Retired  Tobacco Use  . Smoking status: Never Smoker  . Smokeless tobacco: Never Used  Substance and Sexual Activity  . Alcohol use: Yes    Alcohol/week: 0.6 oz    Types: 1 Glasses of wine per week    Comment: 1 glass of wine per night  . Drug use: No  . Sexual activity: Not on file  Other Topics Concern  . Not on file  Social History Narrative   Son and daughter      Has living will   Daughter is health care POA   Has DNR ---confirmed and rewritten 02/07/17   No tube    [employer]     Constitutional: Pt reports fatigue. Denies fever, malaise, headache or abrupt weight changes.  HEENT: Denies eye pain, eye redness, ear pain, ringing in the ears, wax buildup, runny nose, nasal congestion, bloody  nose, or sore throat. Respiratory: Pt reports cough. Denies difficulty breathing, shortness of breath, or sputum production.   Cardiovascular: Denies chest pain, chest tightness, palpitations or swelling in the hands or feet.  Gastrointestinal: Denies abdominal pain, bloating, constipation, diarrhea or blood in the stool.  Musculoskeletal: Pt reports pain in left axilla. Denies decrease in range of motion, difficulty with gait, muscle pain or joint pain and swelling.     No other specific complaints in a complete review of systems (except as listed in HPI above).  Objective:   Physical Exam   BP 122/76   Pulse 74    Wt Readings from Last 3 Encounters:  11/16/17 128 lb 9.6 oz (58.3 kg)  08/22/17 136 lb (61.7 kg)  05/22/17 125 lb (56.7 kg)    General: Appears her stated age, weak but in NAD. Cardiovascular: Irregular but normal rhythm.  Pulmonary/Chest: Normal effort and positive vesicular breath sounds. No respiratory distress. No wheezes, rales or ronchi noted.  Musculoskeletal: Pain with palpation just below the left axilla, but no masses's or bruising noted.   BMET    Component Value Date/Time   NA 136 11/16/2017 0614   NA 144 11/10/2011 1018   K 4.2 11/16/2017 0614   K 4.1 11/10/2011 1018   CL 105 11/16/2017 0614   CL 107 11/10/2011 1018   CO2 26 11/16/2017 0614   CO2 26 11/10/2011 1018   GLUCOSE 95 11/16/2017 0614   GLUCOSE 74 11/10/2011 1018   BUN 11 11/16/2017 0614   BUN 16 11/10/2011 1018   CREATININE 0.66 11/16/2017 0614   CREATININE 0.58 06/09/2012 1346   CALCIUM 8.1 (L) 11/16/2017 0614   CALCIUM 8.7 11/10/2011 1018   GFRNONAA >60 11/16/2017 0614   GFRNONAA >60 11/10/2011 1018   GFRAA >60 11/16/2017 0614   GFRAA >60 11/10/2011 1018    Lipid Panel  No results found for: CHOL, TRIG, HDL, CHOLHDL, VLDL, LDLCALC  CBC    Component Value Date/Time   WBC 6.5 11/14/2017 2336   RBC 4.31 11/14/2017 2336   HGB 12.4 11/14/2017 2336   HGB 12.6 11/10/2011  1018   HCT 37.2 11/14/2017 2336   HCT 37.9 11/10/2011 1018   PLT 224 11/14/2017 2336   PLT 271 11/10/2011 1018   MCV 86.5 11/14/2017 2336   MCV 90 11/10/2011 1018   MCH 28.8 11/14/2017 2336   MCHC 33.3 11/14/2017 2336   RDW 13.8 11/14/2017 2336   RDW 14.7 (H) 11/10/2011 1018  LYMPHSABS 1.7 11/14/2017 2336   MONOABS 0.4 11/14/2017 2336   EOSABS 0.0 11/14/2017 2336   BASOSABS 0.0 11/14/2017 2336    Hgb A1C No results found for: HGBA1C         Assessment & Plan:   Hospital Follow Up for Influenza, Afib with RVR, Pain in left Armpit s/p Fall at Nursing Home:  Hospital notes, labs and imaging reviewed She will finish out her course of Tamifu Continue Cardizem per Dr. Alphonsus SiasLetvak, on ASA.  Monitor BP once weekly No need to follow up with cardiology Tylenol per SO for pain  Will reassess as needed Nicki ReaperBAITY, Kaleeyah Cuffie, NP

## 2017-11-29 NOTE — Patient Instructions (Signed)

## 2017-12-16 ENCOUNTER — Ambulatory Visit: Payer: Medicare HMO | Admitting: Cardiovascular Disease

## 2018-02-20 ENCOUNTER — Ambulatory Visit: Payer: Medicare HMO | Admitting: Internal Medicine

## 2018-02-20 ENCOUNTER — Encounter: Payer: Self-pay | Admitting: Internal Medicine

## 2018-02-20 VITALS — BP 136/72 | HR 78 | Temp 98.1°F | Resp 18 | Wt 136.8 lb

## 2018-02-20 DIAGNOSIS — I10 Essential (primary) hypertension: Secondary | ICD-10-CM

## 2018-02-20 DIAGNOSIS — R1084 Generalized abdominal pain: Secondary | ICD-10-CM | POA: Diagnosis not present

## 2018-02-20 DIAGNOSIS — I480 Paroxysmal atrial fibrillation: Secondary | ICD-10-CM

## 2018-02-20 DIAGNOSIS — R109 Unspecified abdominal pain: Secondary | ICD-10-CM | POA: Insufficient documentation

## 2018-02-20 DIAGNOSIS — G3184 Mild cognitive impairment, so stated: Secondary | ICD-10-CM

## 2018-02-20 DIAGNOSIS — E039 Hypothyroidism, unspecified: Secondary | ICD-10-CM | POA: Diagnosis not present

## 2018-02-20 MED ORDER — POLYETHYLENE GLYCOL 3350 17 GM/SCOOP PO POWD: 17.0000 g | Freq: Every day | ORAL | 1 refills | Status: DC

## 2018-02-20 NOTE — Assessment & Plan Note (Signed)
Seems to be euthyroid 

## 2018-02-20 NOTE — Assessment & Plan Note (Signed)
Stable memory loss Doing okay in AL setting

## 2018-02-20 NOTE — Progress Notes (Signed)
Subjective:    Patient ID: Karen Colon, female    DOB: December 24, 1925, 84 y.o.   MRN: [id number]  HPI Visit in AL apartment at [facility] for follow up of chronic health conditions Reviewed status with [facility] with son at the visit  Overall doing okay but having some vague abdominal ttrouble Occasional constipation Only goes about 3 times a week  No palpitations--but occasionally "don't feel good". More like a sick stomach (brief) No chest pain No SOB or change in tolerance with walking, etc No dizziness  Mood has been okay No significant anxiety or depression No anhedonia and generally   Ongoing memory issues Still mild and no obvious progression  Current Outpatient Medications on File Prior to Visit  Medication Sig Dispense Refill  . acidophilus (RISAQUAD) CAPS capsule Take 1 capsule by mouth daily.    Marland Kitchen albuterol (PROVENTIL HFA;VENTOLIN HFA) 108 (90 Base) MCG/ACT inhaler Inhale 2 puffs into the lungs every 6 (six) hours as needed for wheezing or shortness of breath. 1 Inhaler 0  . aspirin EC 81 MG tablet Take 81 mg by mouth daily.    Marland Kitchen diltiazem (CARDIZEM CD) 120 MG 24 hr capsule Take 1 capsule (120 mg total) by mouth daily. 30 capsule 0  . escitalopram (LEXAPRO) 5 MG tablet Take 1 tablet (5 mg total) by mouth daily. 30 tablet 2  . guaiFENesin-dextromethorphan (ROBITUSSIN DM) 100-10 MG/5ML syrup Take 15 mLs by mouth every 4 (four) hours as needed for cough. 118 mL 0  . thyroid (ARMOUR) 90 MG tablet Take 90 mg by mouth daily. At 7am    . traZODone (DESYREL) 100 MG tablet Take 100 mg by mouth at bedtime as needed for sleep.    . cetirizine (ZYRTEC) 10 MG tablet Take 10 mg by mouth at bedtime.     No current facility-administered medications on file prior to visit.     Allergies  Allergen Reactions  . Ciprofloxacin     Muscle spasm   . Iodinated Diagnostic Agents     Iodine DYe  . Other     Dust Mites    Past Medical History:  Diagnosis Date  . Allergic  rhinitis   . Depressive disorder, not elsewhere classified   . Hearing loss    bilateral hearing aides  . History of MRI 9/10   Cardiac MRI, normal  . Hyperlipidemia   . Hypertension   . Hypothyroidism   . Insomnia, unspecified   . Osteopenia   . Paroxysmal A-fib (HCC) 2009   [facility]  . Teeth grinding   . Unspecified vitamin D deficiency     Past Surgical History:  Procedure Laterality Date  . APPENDECTOMY  2009  . TONSILLECTOMY      Family History  Problem Relation Age of Onset  . Heart disease Mother   . Pneumonia Father     Social History   Socioeconomic History  . Marital status: Married    Spouse name: Not on file  . Number of children: 2  . Years of education: Not on file  . Highest education level: Not on file  Occupational History  . Occupation: Psychologist, forensic    Comment: Retired  Engineer, production  . Financial resource strain: Not on file  . Food insecurity:    Worry: Not on file    Inability: Not on file  . Transportation needs:    Medical: Not on file    Non-medical: Not on file  Tobacco Use  . Smoking  status: Never Smoker  . Smokeless tobacco: Never Used  Substance and Sexual Activity  . Alcohol use: Yes    Alcohol/week: 0.6 oz    Types: 1 Glasses of wine per week    Comment: 1 glass of wine per night  . Drug use: No  . Sexual activity: Not on file  Lifestyle  . Physical activity:    Days per week: Not on file    Minutes per session: Not on file  . Stress: Not on file  Relationships  . Social connections:    Talks on phone: Not on file    Gets together: Not on file    Attends religious service: Not on file    Active member of club or organization: Not on file    Attends meetings of clubs or organizations: Not on file    Relationship status: Not on file  . Intimate partner violence:    Fear of current or ex partner: Not on file    Emotionally abused: Not on file    Physically abused: Not on file    Forced sexual activity: Not on file    Other Topics Concern  . Not on file  Social History Narrative   Son and daughter      Has living will   Daughter is health care POA   Has DNR ---confirmed and rewritten 02/07/17   No tube    [employer]   Review of Systems Sleeps well  Appetite is fine Weight  No heartburn or dysphagia    Objective:   Physical Exam  Constitutional: She appears well-developed. No distress.  Neck: No thyromegaly present.  Cardiovascular: Normal rate, regular rhythm and normal heart sounds. Exam reveals no gallop.  No murmur heard. Respiratory: Effort normal and breath sounds normal. No respiratory distress. She has no wheezes. She has no rales.  GI: Soft. Bowel sounds are normal. She exhibits no distension. There is no rebound and no guarding.  Some vague mid abdominal tenderness---but very scant  Musculoskeletal: She exhibits no edema or tenderness.  Lymphadenopathy:    She has no cervical adenopathy.  Psychiatric: She has a normal mood and affect. Her behavior is normal.           Assessment & Plan:

## 2018-02-20 NOTE — Assessment & Plan Note (Signed)
BP Readings from Last 3 Encounters:  02/20/18 136/72  11/29/17 122/76  11/16/17 (!) 150/80   Good control No changes needed

## 2018-02-20 NOTE — Assessment & Plan Note (Signed)
Regular now On asa only

## 2018-02-20 NOTE — Assessment & Plan Note (Signed)
Vague and not entirely new Does affect her desire to go out and do things Not clearly food related--no heartburn, dysphagia or weight loss Does tend to be constipated  P:  Will start daily miralax and add senna prn to make bowels regular      If pain persists, would try famotidine or omeprazole empirically      If still having problems, GI consultation

## 2018-04-23 ENCOUNTER — Ambulatory Visit: Payer: Medicare HMO | Admitting: Internal Medicine

## 2018-04-23 VITALS — BP 116/76 | HR 76 | Temp 97.2°F | Resp 20

## 2018-04-23 DIAGNOSIS — L989 Disorder of the skin and subcutaneous tissue, unspecified: Secondary | ICD-10-CM

## 2018-04-30 ENCOUNTER — Encounter: Payer: Self-pay | Admitting: Internal Medicine

## 2018-04-30 NOTE — Progress Notes (Signed)
Subjective:    Patient ID: Karen Colon, female    DOB: [birth year], 84 y.o.   MRN: [id number]  HPI  Asked to see pt in 211 C/o skin lesion to left upper arm Was started on Augmentin for possible cellulitis, RN reports no improvement Resident reports it seems to be getting bigger It is nontender and has not drained She has not tried anything topical  Review of Systems  Past Medical History:  Diagnosis Date  . Allergic rhinitis   . Depressive disorder, not elsewhere classified   . Hearing loss    bilateral hearing aides  . History of MRI 9/10   Cardiac MRI, normal  . Hyperlipidemia   . Hypertension   . Hypothyroidism   . Insomnia, unspecified   . Osteopenia   . Paroxysmal A-fib (HCC) 2009   [facility]  . Teeth grinding   . Unspecified vitamin D deficiency     Current Outpatient Medications  Medication Sig Dispense Refill  . acidophilus (RISAQUAD) CAPS capsule Take 1 capsule by mouth daily.    Marland Kitchen aspirin EC 81 MG tablet Take 81 mg by mouth daily.    Marland Kitchen diltiazem (CARDIZEM CD) 120 MG 24 hr capsule Take 1 capsule (120 mg total) by mouth daily. 30 capsule 0  . escitalopram (LEXAPRO) 5 MG tablet Take 1 tablet (5 mg total) by mouth daily. 30 tablet 2  . polyethylene glycol powder (GLYCOLAX/MIRALAX) powder Take 17 g by mouth daily. 3350 g 1  . thyroid (ARMOUR) 90 MG tablet Take 90 mg by mouth daily. At 7am    . traZODone (DESYREL) 100 MG tablet Take 100 mg by mouth at bedtime as needed for sleep.    Marland Kitchen albuterol (PROVENTIL HFA;VENTOLIN HFA) 108 (90 Base) MCG/ACT inhaler Inhale 2 puffs into the lungs every 6 (six) hours as needed for wheezing or shortness of breath. (Patient not taking: Reported on 04/30/2018) 1 Inhaler 0  . cetirizine (ZYRTEC) 10 MG tablet Take 10 mg by mouth at bedtime.    Marland Kitchen guaiFENesin-dextromethorphan (ROBITUSSIN DM) 100-10 MG/5ML syrup Take 15 mLs by mouth every 4 (four) hours as needed for cough. (Patient not taking: Reported on 04/30/2018) 118 mL 0   No current  facility-administered medications for this visit.     Allergies  Allergen Reactions  . Ciprofloxacin     Muscle spasm   . Iodinated Diagnostic Agents     Iodine DYe  . Other     Dust Mites    Family History  Problem Relation Age of Onset  . Heart disease Mother   . Pneumonia Father     Social History   Socioeconomic History  . Marital status: Married    Spouse name: Not on file  . Number of children: 2  . Years of education: Not on file  . Highest education level: Not on file  Occupational History  . Occupation: Psychologist, forensic    Comment: Retired  Engineer, production  . Financial resource strain: Not on file  . Food insecurity:    Worry: Not on file    Inability: Not on file  . Transportation needs:    Medical: Not on file    Non-medical: Not on file  Tobacco Use  . Smoking status: Never Smoker  . Smokeless tobacco: Never Used  Substance and Sexual Activity  . Alcohol use: Yes    Alcohol/week: 0.6 oz    Types: 1 Glasses of wine per week    Comment: 1 glass of wine per night  .  Drug use: No  . Sexual activity: Not on file  Lifestyle  . Physical activity:    Days per week: Not on file    Minutes per session: Not on file  . Stress: Not on file  Relationships  . Social connections:    Talks on phone: Not on file    Gets together: Not on file    Attends religious service: Not on file    Active member of club or organization: Not on file    Attends meetings of clubs or organizations: Not on file    Relationship status: Not on file  . Intimate partner violence:    Fear of current or ex partner: Not on file    Emotionally abused: Not on file    Physically abused: Not on file    Forced sexual activity: Not on file  Other Topics Concern  . Not on file  Social History Narrative   Son and daughter      Has living will   Daughter is health care POA   Has DNR ---confirmed and rewritten 02/07/17   No tube    [employer]     Constitutional: Denies  fever, malaise, fatigue, headache or abrupt weight changes.  Skin: Pt reports skin lesion to left upper arm. Denies ulcercations.    No other specific complaints in a complete review of systems (except as listed in HPI above).     Objective:   Physical Exam  BP 116/76   Pulse 76   Temp (!) 97.2 F (36.2 C)   Resp 20  Wt Readings from Last 3 Encounters:  02/20/18 136 lb 12.8 oz (62.1 kg)  11/16/17 128 lb 9.6 oz (58.3 kg)  08/22/17 136 lb (61.7 kg)    General: Appears their stated age, well developed, well nourished in NAD. Skin: Warm, dry and intact. No rashes, lesions or ulcerations noted. HEENT: Head: normal shape and size; Eyes: sclera white, no icterus, conjunctiva pink, PERRLA and EOMs intact; Ears: Tm's gray and intact, normal light reflex; Nose: mucosa pink and moist, septum midline; Throat/Mouth: Teeth present, mucosa pink and moist, no exudate, lesions or ulcerations noted.  Neck:  Neck supple, trachea midline. No masses, lumps or thyromegaly present.  Cardiovascular: Normal rate and rhythm. S1,S2 noted.  No murmur, rubs or gallops noted. No JVD or BLE edema. No carotid bruits noted. Pulmonary/Chest: Normal effort and positive vesicular breath sounds. No respiratory distress. No wheezes, rales or ronchi noted.  Abdomen: Soft and nontender. Normal bowel sounds. No distention or masses noted. Liver, spleen and kidneys non palpable. Musculoskeletal: Normal range of motion. No signs of joint swelling. No difficulty with gait.  Neurological: Alert and oriented. Cranial nerves II-XII grossly intact. Coordination normal.  Psychiatric: Mood and affect normal. Behavior is normal. Judgment and thought content normal.   EKG:  BMET    Component Value Date/Time   NA 136 11/16/2017 0614   NA 144 11/10/2011 1018   K 4.2 11/16/2017 0614   K 4.1 11/10/2011 1018   CL 105 11/16/2017 0614   CL 107 11/10/2011 1018   CO2 26 11/16/2017 0614   CO2 26 11/10/2011 1018   GLUCOSE 95  11/16/2017 0614   GLUCOSE 74 11/10/2011 1018   BUN 11 11/16/2017 0614   BUN 16 11/10/2011 1018   CREATININE 0.66 11/16/2017 0614   CREATININE 0.58 06/09/2012 1346   CALCIUM 8.1 (L) 11/16/2017 0614   CALCIUM 8.7 11/10/2011 1018   GFRNONAA >60 11/16/2017 0614   GFRNONAA >  60 11/10/2011 1018   GFRAA >60 11/16/2017 0614   GFRAA >60 11/10/2011 1018    Lipid Panel  No results found for: CHOL, TRIG, HDL, CHOLHDL, VLDL, LDLCALC  CBC    Component Value Date/Time   WBC 6.5 11/14/2017 2336   RBC 4.31 11/14/2017 2336   HGB 12.4 11/14/2017 2336   HGB 12.6 11/10/2011 1018   HCT 37.2 11/14/2017 2336   HCT 37.9 11/10/2011 1018   PLT 224 11/14/2017 2336   PLT 271 11/10/2011 1018   MCV 86.5 11/14/2017 2336   MCV 90 11/10/2011 1018   MCH 28.8 11/14/2017 2336   MCHC 33.3 11/14/2017 2336   RDW 13.8 11/14/2017 2336   RDW 14.7 (H) 11/10/2011 1018   LYMPHSABS 1.7 11/14/2017 2336   MONOABS 0.4 11/14/2017 2336   EOSABS 0.0 11/14/2017 2336   BASOSABS 0.0 11/14/2017 2336    Hgb A1C No results found for: HGBA1C          Assessment & Plan:   Skin Lesion of Arm:  Concerning for basal cell cancer Will refer to derm for evaluation, excision and further treatment  Return precautions discussed Nicki Reaper, NP

## 2018-04-30 NOTE — Patient Instructions (Signed)
Excision of Skin Lesions, Care After Refer to this sheet in the next few weeks. These instructions provide you with information about caring for yourself after your procedure. Your health care provider may also give you more specific instructions. Your treatment has been planned according to current medical practices, but problems sometimes occur. Call your health care provider if you have any problems or questions after your procedure. What can I expect after the procedure? After your procedure, it is common to have pain or discomfort at the excision site. Follow these instructions at home:  Take over-the-counter and prescription medicines only as told by your health care provider.  Follow instructions from your health care provider about: ? How to take care of your excision site. You should keep the site clean, dry, and protected for at least 48 hours. ? When and how you should change your bandage (dressing). ? When you should remove your dressing. ? Removing whatever was used to close your excision site.  Check the excision area every day for signs of infection. Watch for: ? Redness, swelling, or pain. ? Fluid, blood, or pus.  For bleeding, apply gentle but firm pressure to the area using a folded towel for 20 minutes.  Avoid high-impact exercise and activities until the stitches (sutures) are removed or the area heals.  Follow instructions from your health care provider about how to minimize scarring. Avoid sun exposure until the area has healed. Scarring should lessen over time.  Keep all follow-up visits as told by your health care provider. This is important. Contact a health care provider if:  You have a fever.  You have redness, swelling, or pain at the excision site.  You have fluid, blood, or pus coming from the excision site.  You have ongoing bleeding at the excision site.  You have pain that does not improve in 2-3 days after your procedure.  You notice skin  irregularities or changes in sensation. This information is not intended to replace advice given to you by your health care provider. Make sure you discuss any questions you have with your health care provider. Document Released: 01/25/2015 Document Revised: 02/16/2016 Document Reviewed: 10/27/2014 Elsevier Interactive Patient Education  2018 Elsevier Inc.  

## 2018-05-21 ENCOUNTER — Ambulatory Visit: Payer: Medicare HMO | Admitting: Internal Medicine

## 2018-05-21 DIAGNOSIS — I480 Paroxysmal atrial fibrillation: Secondary | ICD-10-CM

## 2018-05-21 DIAGNOSIS — F39 Unspecified mood [affective] disorder: Secondary | ICD-10-CM

## 2018-05-21 DIAGNOSIS — I10 Essential (primary) hypertension: Secondary | ICD-10-CM

## 2018-05-21 DIAGNOSIS — E039 Hypothyroidism, unspecified: Secondary | ICD-10-CM

## 2018-05-21 DIAGNOSIS — G3184 Mild cognitive impairment, so stated: Secondary | ICD-10-CM | POA: Diagnosis not present

## 2018-05-23 ENCOUNTER — Encounter: Payer: Self-pay | Admitting: Internal Medicine

## 2018-05-23 NOTE — Assessment & Plan Note (Signed)
Continue Lexapro

## 2018-05-23 NOTE — Assessment & Plan Note (Signed)
Continue Armour Thyroid Will check Free T4 yearly

## 2018-05-23 NOTE — Assessment & Plan Note (Signed)
Appreciated ALF care

## 2018-05-23 NOTE — Assessment & Plan Note (Signed)
Paroxysmal Continue Diltiazem on ASA

## 2018-05-23 NOTE — Assessment & Plan Note (Signed)
Continue Diltiazem

## 2018-05-23 NOTE — Progress Notes (Signed)
Subjective:    Patient ID: Karen SpringKaryn Coronado, female    DOB: 1926/02/21, 84 y.o.   MRN: [id number]  HPI  Routine follow up for resident in [address] Reviewed with RN. Recently had excision of lesion to LUE, healing well. Awaiting path results. Sleeps well Walks without device.  Appetite good, weight stable She reports loose stools, voids fine  Hypothyroid: She denies any issues on her current dose of Armour Thyroid. Levels checked 10/2017.  Mood Disorder: Chronic anxiety/dysthymia. Controlled on Lexapro.  Sleep Disturbance: Stable on Trazadone.   HLD: Not on a statin. She consumes a low fat diet.  HTN: BP controlled on Diltiazem.  Paroxysmal Afib: No issues on Diltiazem and ASA.  MCI: No meds. Intermittent confusion. Stable functional status.  Review of Systems      Past Medical History:  Diagnosis Date  . Allergic rhinitis   . Depressive disorder, not elsewhere classified   . Hearing loss    bilateral hearing aides  . History of MRI 9/10   Cardiac MRI, normal  . Hyperlipidemia   . Hypertension   . Hypothyroidism   . Insomnia, unspecified   . Osteopenia   . Paroxysmal A-fib (HCC) 2009   [facility]  . Teeth grinding   . Unspecified vitamin D deficiency     Current Outpatient Medications  Medication Sig Dispense Refill  . acidophilus (RISAQUAD) CAPS capsule Take 1 capsule by mouth daily.    Marland Kitchen. aspirin EC 81 MG tablet Take 81 mg by mouth daily.    Marland Kitchen. diltiazem (CARDIZEM CD) 120 MG 24 hr capsule Take 1 capsule (120 mg total) by mouth daily. 30 capsule 0  . escitalopram (LEXAPRO) 5 MG tablet Take 1 tablet (5 mg total) by mouth daily. 30 tablet 2  . polyethylene glycol powder (GLYCOLAX/MIRALAX) powder Take 17 g by mouth daily. 3350 g 1  . thyroid (ARMOUR) 90 MG tablet Take 90 mg by mouth daily. At 7am    . traZODone (DESYREL) 100 MG tablet Take 100 mg by mouth at bedtime as needed for sleep.    Marland Kitchen. albuterol (PROVENTIL HFA;VENTOLIN HFA) 108 (90 Base) MCG/ACT inhaler Inhale 2  puffs into the lungs every 6 (six) hours as needed for wheezing or shortness of breath. (Patient not taking: Reported on 04/30/2018) 1 Inhaler 0  . cetirizine (ZYRTEC) 10 MG tablet Take 10 mg by mouth at bedtime.    Marland Kitchen. guaiFENesin-dextromethorphan (ROBITUSSIN DM) 100-10 MG/5ML syrup Take 15 mLs by mouth every 4 (four) hours as needed for cough. (Patient not taking: Reported on 04/30/2018) 118 mL 0   No current facility-administered medications for this visit.     Allergies  Allergen Reactions  . Ciprofloxacin     Muscle spasm   . Iodinated Diagnostic Agents     Iodine DYe  . Other     Dust Mites    Family History  Problem Relation Age of Onset  . Heart disease Mother   . Pneumonia Father     Social History   Socioeconomic History  . Marital status: Married    Spouse name: Not on file  . Number of children: 2  . Years of education: Not on file  . Highest education level: Not on file  Occupational History  . Occupation: Psychologist, forensicHigh school teacher    Comment: Retired  Engineer, productionocial Needs  . Financial resource strain: Not on file  . Food insecurity:    Worry: Not on file    Inability: Not on file  . Transportation needs:  Medical: Not on file    Non-medical: Not on file  Tobacco Use  . Smoking status: Never Smoker  . Smokeless tobacco: Never Used  Substance and Sexual Activity  . Alcohol use: Yes    Alcohol/week: 1.0 standard drinks    Types: 1 Glasses of wine per week    Comment: 1 glass of wine per night  . Drug use: No  . Sexual activity: Not on file  Lifestyle  . Physical activity:    Days per week: Not on file    Minutes per session: Not on file  . Stress: Not on file  Relationships  . Social connections:    Talks on phone: Not on file    Gets together: Not on file    Attends religious service: Not on file    Active member of club or organization: Not on file    Attends meetings of clubs or organizations: Not on file    Relationship status: Not on file  . Intimate  partner violence:    Fear of current or ex partner: Not on file    Emotionally abused: Not on file    Physically abused: Not on file    Forced sexual activity: Not on file  Other Topics Concern  . Not on file  Social History Narrative   Son and daughter      Has living will   Daughter is health care POA   Has DNR ---confirmed and rewritten 02/07/17   No tube    [employer]     Constitutional: Denies fever, malaise, fatigue, headache or abrupt weight changes.  HEENT: Denies eye pain, eye redness, ear pain, ringing in the ears, wax buildup, runny nose, nasal congestion, bloody nose, or sore throat. Respiratory: Denies difficulty breathing, shortness of breath, cough or sputum production.   Cardiovascular: Denies chest pain, chest tightness, palpitations or swelling in the hands or feet.  Gastrointestinal: Pt reports loose stools. Denies abdominal pain, bloating, constipation, or blood in the stool.  GU: Denies urgency, frequency, pain with urination, burning sensation, blood in urine, odor or discharge. Musculoskeletal: Denies decrease in range of motion, difficulty with gait, muscle pain or joint pain and swelling.  Skin: Denies redness, rashes, lesions or ulcercations.  Neurological: Pt reports insomnia. Denies dizziness, difficulty with memory, difficulty with speech or problems with balance and coordination.  Psych: Pt reports anxiety and depression. Denies SI/HI.  No other specific complaints in a complete review of systems (except as listed in HPI above).  Objective:   Physical Exam   BP 137/77   Pulse 74   Temp (!) 96.7 F (35.9 C)   Resp 14   Wt 135 lb 6.4 oz (61.4 kg)   BMI 21.85 kg/m  Wt Readings from Last 3 Encounters:  05/23/18 135 lb 6.4 oz (61.4 kg)  02/20/18 136 lb 12.8 oz (62.1 kg)  11/16/17 128 lb 9.6 oz (58.3 kg)    General: Appears her stated age, well developed, well nourished in NAD. Skin: Healed incision noted of LUE. Neck:  No nodes or  JVD. Cardiovascular: Normal rate and rhythm. S1,S2 noted.  No murmur, rubs or gallops noted. No JVD or BLE edema.  Pulmonary/Chest: Normal effort and positive vesicular breath sounds. No respiratory distress. No wheezes, rales or ronchi noted.  Abdomen: Soft and nontender. Normal bowel sounds.  Musculoskeletal:  No difficulty with gait.  Neurological: Alert and oriented. HOH.    BMET    Component Value Date/Time   NA 136 11/16/2017  1610   NA 144 11/10/2011 1018   K 4.2 11/16/2017 0614   K 4.1 11/10/2011 1018   CL 105 11/16/2017 0614   CL 107 11/10/2011 1018   CO2 26 11/16/2017 0614   CO2 26 11/10/2011 1018   GLUCOSE 95 11/16/2017 0614   GLUCOSE 74 11/10/2011 1018   BUN 11 11/16/2017 0614   BUN 16 11/10/2011 1018   CREATININE 0.66 11/16/2017 0614   CREATININE 0.58 06/09/2012 1346   CALCIUM 8.1 (L) 11/16/2017 0614   CALCIUM 8.7 11/10/2011 1018   GFRNONAA >60 11/16/2017 0614   GFRNONAA >60 11/10/2011 1018   GFRAA >60 11/16/2017 0614   GFRAA >60 11/10/2011 1018    Lipid Panel  No results found for: CHOL, TRIG, HDL, CHOLHDL, VLDL, LDLCALC  CBC    Component Value Date/Time   WBC 6.5 11/14/2017 2336   RBC 4.31 11/14/2017 2336   HGB 12.4 11/14/2017 2336   HGB 12.6 11/10/2011 1018   HCT 37.2 11/14/2017 2336   HCT 37.9 11/10/2011 1018   PLT 224 11/14/2017 2336   PLT 271 11/10/2011 1018   MCV 86.5 11/14/2017 2336   MCV 90 11/10/2011 1018   MCH 28.8 11/14/2017 2336   MCHC 33.3 11/14/2017 2336   RDW 13.8 11/14/2017 2336   RDW 14.7 (H) 11/10/2011 1018   LYMPHSABS 1.7 11/14/2017 2336   MONOABS 0.4 11/14/2017 2336   EOSABS 0.0 11/14/2017 2336   BASOSABS 0.0 11/14/2017 2336    Hgb A1C No results found for: HGBA1C         Assessment & Plan:

## 2018-05-28 ENCOUNTER — Emergency Department: Payer: Medicare HMO

## 2018-05-28 ENCOUNTER — Emergency Department
Admission: EM | Admit: 2018-05-28 | Discharge: 2018-05-28 | Disposition: A | Discharge status: Home / Self Care | Payer: Medicare HMO | Attending: Emergency Medicine | Admitting: Emergency Medicine

## 2018-05-28 ENCOUNTER — Encounter: Payer: Self-pay | Admitting: Emergency Medicine

## 2018-05-28 DIAGNOSIS — I480 Paroxysmal atrial fibrillation: Secondary | ICD-10-CM | POA: Insufficient documentation

## 2018-05-28 DIAGNOSIS — R51 Headache: Secondary | ICD-10-CM | POA: Insufficient documentation

## 2018-05-28 DIAGNOSIS — I10 Essential (primary) hypertension: Secondary | ICD-10-CM | POA: Insufficient documentation

## 2018-05-28 DIAGNOSIS — R519 Headache, unspecified: Secondary | ICD-10-CM

## 2018-05-28 DIAGNOSIS — E039 Hypothyroidism, unspecified: Secondary | ICD-10-CM | POA: Diagnosis not present

## 2018-05-28 LAB — CBC WITH DIFFERENTIAL/PLATELET
BASOS ABS: 0.1 10*3/uL (ref 0–0.1)
BASOS PCT: 1 %
EOS PCT: 2 %
Eosinophils Absolute: 0.2 10*3/uL (ref 0–0.7)
HCT: 37 % (ref 35.0–47.0)
Hemoglobin: 12.5 g/dL (ref 12.0–16.0)
LYMPHS PCT: 37 %
Lymphs Abs: 3.1 10*3/uL (ref 1.0–3.6)
MCH: 29.4 pg (ref 26.0–34.0)
MCHC: 33.9 g/dL (ref 32.0–36.0)
MCV: 86.8 fL (ref 80.0–100.0)
MONO ABS: 0.6 10*3/uL (ref 0.2–0.9)
Monocytes Relative: 7 %
NEUTROS ABS: 4.5 10*3/uL (ref 1.4–6.5)
Neutrophils Relative %: 53 %
PLATELETS: 301 10*3/uL (ref 150–440)
RBC: 4.27 MIL/uL (ref 3.80–5.20)
RDW: 14.6 % — AB (ref 11.5–14.5)
WBC: 8.3 10*3/uL (ref 3.6–11.0)

## 2018-05-28 LAB — COMPREHENSIVE METABOLIC PANEL
ALBUMIN: 3.7 g/dL (ref 3.5–5.0)
ALT: 15 U/L (ref 0–44)
AST: 24 U/L (ref 15–41)
Alkaline Phosphatase: 90 U/L (ref 38–126)
Anion gap: 9 (ref 5–15)
BUN: 15 mg/dL (ref 8–23)
CHLORIDE: 106 mmol/L (ref 98–111)
CO2: 24 mmol/L (ref 22–32)
CREATININE: 0.6 mg/dL (ref 0.44–1.00)
Calcium: 8.9 mg/dL (ref 8.9–10.3)
GFR calc Af Amer: >60 mL/min (ref >60)
GFR calc non Af Amer: >60 mL/min (ref >60)
GLUCOSE: 91 mg/dL (ref 70–99)
POTASSIUM: 3.4 mmol/L — AB (ref 3.5–5.1)
Sodium: 139 mmol/L (ref 135–145)
Total Bilirubin: 0.6 mg/dL (ref 0.3–1.2)
Total Protein: 6.2 g/dL — ABNORMAL LOW (ref 6.5–8.1)

## 2018-05-28 LAB — TROPONIN I

## 2018-05-28 MED ORDER — LORAZEPAM 2 MG/ML IJ SOLN
INTRAMUSCULAR | Status: AC
  Administered 2018-05-28: 0.5 mg via INTRAVENOUS
  Filled 2018-05-28: qty 1

## 2018-05-28 MED ORDER — LORAZEPAM 2 MG/ML IJ SOLN
0.5000 mg | Freq: Once | INTRAMUSCULAR | Status: AC
  Administered 2018-05-28: 0.5 mg via INTRAVENOUS

## 2018-05-28 NOTE — ED Notes (Signed)
Pt very agitated and screaming out. MD gave order for IV ativan

## 2018-05-28 NOTE — ED Notes (Signed)
DAughter states she is POA, and pt has DNR as well

## 2018-05-28 NOTE — ED Notes (Signed)
Family at bedside. 

## 2018-05-28 NOTE — ED Notes (Signed)
Daughter at bedside, pt more calm at this time

## 2018-05-28 NOTE — ED Provider Notes (Signed)
[facility] Emergency Department Provider Note       Time seen: ----------------------------------------- 11:12 AM on 05/28/2018 -----------------------------------------  Level V caveat: History/ROS limited by dementia I have reviewed the triage vital signs and the nursing notes.  HISTORY   Chief Complaint Headache    HPI Karen Colon is a 84 y.o. female with a history of dementia, hyperlipidemia, hypertension, paroxysmal atrial fibrillation, insomnia who presents to the ED for headache.  Patient had reportedly had a headache today which has resolved.  She reportedly fell several days ago and she is sent to the ER for further evaluation.  She has a history of dementia cannot give further review of systems or report.  Past Medical History:  Diagnosis Date  . Allergic rhinitis   . Depressive disorder, not elsewhere classified   . Hearing loss    bilateral hearing aides  . History of MRI 9/10   Cardiac MRI, normal  . Hyperlipidemia   . Hypertension   . Hypothyroidism   . Insomnia, unspecified   . Osteopenia   . Paroxysmal A-fib (HCC) 2009   [facility]  . Teeth grinding   . Unspecified vitamin D deficiency     Patient Active Problem List   Diagnosis Date Noted  . Mood disorder (HCC) 02/07/2017  . Mild cognitive impairment 06/25/2012  . Hypothyroidism 06/09/2012  . Allergic rhinitis 06/09/2012  . Atrial fibrillation (HCC) 06/12/2011  . HYPERTENSION, BENIGN 03/14/2009    Past Surgical History:  Procedure Laterality Date  . APPENDECTOMY  2009  . TONSILLECTOMY      Allergies Ciprofloxacin; Iodinated diagnostic agents; and Other  Social History Social History   Tobacco Use  . Smoking status: Never Smoker  . Smokeless tobacco: Never Used  Substance Use Topics  . Alcohol use: Yes    Alcohol/week: 1.0 standard drinks    Types: 1 Glasses of wine per week    Comment: 1 glass of wine per night  . Drug use: No   Review of  Systems Neurological: Positive for recent headache  All systems unknown except as stated in the HPI  ____________________________________________   PHYSICAL EXAM:  VITAL SIGNS: ED Triage Vitals [05/28/18 1112]  Enc Vitals Group     BP (!) 120/107     Pulse Rate 70     Resp 18     Temp 98 F (36.7 C)     Temp Source Oral     SpO2 100 %     Weight      Height      Head Circumference      Peak Flow      Pain Score      Pain Loc      Pain Edu?      Excl. in GC?    Constitutional: Alert but disoriented.  Well appearing and in no distress. Eyes: Conjunctivae are normal. Normal extraocular movements. ENT   Head: Normocephalic with bruising and swelling over the proximal aspect of her nasal bridge and medial to her right eye   Nose: No congestion/rhinnorhea.   Mouth/Throat: Mucous membranes are moist.   Neck: No stridor. Cardiovascular: Normal rate, regular rhythm. No murmurs, rubs, or gallops. Respiratory: Normal respiratory effort without tachypnea nor retractions. Breath sounds are clear and equal bilaterally. No wheezes/rales/rhonchi. Gastrointestinal: Soft and nontender. Normal bowel sounds Musculoskeletal: Nontender with normal range of motion in extremities. No lower extremity tenderness nor edema. Neurologic:  Normal speech and language. No gross focal neurologic deficits are appreciated.  Skin:  Skin is warm, dry and intact. No rash noted. ____________________________________________  ED COURSE:  As part of my medical decision making, I reviewed the following data within the electronic MEDICAL RECORD NUMBER History obtained from family if available, nursing notes, old chart and ekg, as well as notes from prior ED visits. Patient presented for headache with recent fall, we will assess with labs and imaging as indicated at this time.   Procedures ____________________________________________   LABS (pertinent positives/negatives)  Labs Reviewed  CBC WITH  DIFFERENTIAL/PLATELET - Abnormal; Notable for the following components:      Result Value   RDW 14.6 (*)    All other components within normal limits  COMPREHENSIVE METABOLIC PANEL - Abnormal; Notable for the following components:   Potassium 3.4 (*)    Total Protein 6.2 (*)    All other components within normal limits  TROPONIN I    RADIOLOGY  CT head  IMPRESSION: No acute intracranial pathology. ____________________________________________  DIFFERENTIAL DIAGNOSIS   Minor head injury, fracture, subdural, occult infection, electrolyte abnormality  FINAL ASSESSMENT AND PLAN  Headache   Plan: The patient had presented for headache with a history of dementia. Patient's labs are unremarkable. Patient's imaging also unremarkable.  No clear etiology for his symptoms although I think it is likely dementia related.  She is cleared for outpatient follow-up.   Ulice Dash, MD   Note: This note was generated in part or whole with voice recognition software. Voice recognition is usually quite accurate but there are transcription errors that can and very often do occur. I apologize for any typographical errors that were not detected and corrected.     Emily Filbert, MD 05/28/18 [phone]

## 2018-05-28 NOTE — ED Triage Notes (Signed)
PT arrived from assisted living at [facility] with concerns over headache.

## 2018-07-31 ENCOUNTER — Encounter: Payer: Self-pay | Admitting: Internal Medicine

## 2018-08-14 ENCOUNTER — Ambulatory Visit: Payer: Medicare HMO | Admitting: Internal Medicine

## 2018-08-14 ENCOUNTER — Encounter: Payer: Self-pay | Admitting: Internal Medicine

## 2018-08-14 VITALS — BP 146/86 | HR 86 | Resp 18 | Wt 139.4 lb

## 2018-08-14 DIAGNOSIS — I480 Paroxysmal atrial fibrillation: Secondary | ICD-10-CM | POA: Diagnosis not present

## 2018-08-14 DIAGNOSIS — G3184 Mild cognitive impairment, so stated: Secondary | ICD-10-CM

## 2018-08-14 DIAGNOSIS — I10 Essential (primary) hypertension: Secondary | ICD-10-CM | POA: Diagnosis not present

## 2018-08-14 DIAGNOSIS — K5909 Other constipation: Secondary | ICD-10-CM

## 2018-08-14 DIAGNOSIS — F39 Unspecified mood [affective] disorder: Secondary | ICD-10-CM | POA: Diagnosis not present

## 2018-08-14 DIAGNOSIS — E039 Hypothyroidism, unspecified: Secondary | ICD-10-CM

## 2018-08-14 NOTE — Assessment & Plan Note (Signed)
BP Readings from Last 3 Encounters:  08/14/18 (!) 146/86  05/28/18 (!) 149/71  05/23/18 137/77   Reasonable for age No need to monitor more than routinely here

## 2018-08-14 NOTE — Assessment & Plan Note (Signed)
Likely the reason for past abdominal pain Doing well on miralax

## 2018-08-14 NOTE — Assessment & Plan Note (Signed)
Chronic dysthymia and anxiety Seems better with increased escitalopram  Will continue

## 2018-08-14 NOTE — Assessment & Plan Note (Signed)
No symptoms and regular now On ASA only diltiazem

## 2018-08-14 NOTE — Assessment & Plan Note (Signed)
Seems more forgetful and repeating herself more No functional decline--no action needed

## 2018-08-14 NOTE — Assessment & Plan Note (Signed)
Clinically euthyroid Yearly labs on the med

## 2018-08-14 NOTE — Progress Notes (Signed)
Subjective:    Patient ID: Karen Colon, female    DOB: 01-30-1926, 83 y.o.   MRN: [id number]  HPI Visit in AL apartment for review of her chronic medical conditions Reviewed status with [name] RN Husband is also here  Abdominal pain is better Still some constipation but more regular with miralax No long having pain  No chest pain No SOB No dizziness or syncope No edema No headaches No palpitations  Mild memory issues No functional problems---remains fairly independent  Current Outpatient Medications on File Prior to Visit  Medication Sig Dispense Refill  . acidophilus (RISAQUAD) CAPS capsule Take 1 capsule by mouth daily.    Marland Kitchen albuterol (PROVENTIL HFA;VENTOLIN HFA) 108 (90 Base) MCG/ACT inhaler Inhale 2 puffs into the lungs every 6 (six) hours as needed for wheezing or shortness of breath. (Patient not taking: Reported on 04/30/2018) 1 Inhaler 0  . aspirin EC 81 MG tablet Take 81 mg by mouth daily.    Marland Kitchen diltiazem (CARDIZEM CD) 120 MG 24 hr capsule Take 1 capsule (120 mg total) by mouth daily. 30 capsule 0  . escitalopram (LEXAPRO) 5 MG tablet Take 1 tablet (5 mg total) by mouth daily. (Patient taking differently: Take 10 mg by mouth daily. ) 30 tablet 2  . polyethylene glycol powder (GLYCOLAX/MIRALAX) powder Take 17 g by mouth daily. 3350 g 1  . thyroid (ARMOUR) 90 MG tablet Take 90 mg by mouth daily. At 7am    . traZODone (DESYREL) 100 MG tablet Take 100 mg by mouth at bedtime as needed for sleep.     No current facility-administered medications on file prior to visit.     Allergies  Allergen Reactions  . Ciprofloxacin     Muscle spasm   . Iodinated Diagnostic Agents     Iodine DYe  . Other     Dust Mites    Past Medical History:  Diagnosis Date  . Allergic rhinitis   . Depressive disorder, not elsewhere classified   . Hearing loss    bilateral hearing aides  . History of MRI 9/10   Cardiac MRI, normal  . Hyperlipidemia   . Hypertension   . Hypothyroidism     . Insomnia, unspecified   . Osteopenia   . Paroxysmal A-fib (HCC) 2009   [facility]  . Teeth grinding   . Unspecified vitamin D deficiency     Past Surgical History:  Procedure Laterality Date  . APPENDECTOMY  2009  . TONSILLECTOMY      Family History  Problem Relation Age of Onset  . Heart disease Mother   . Pneumonia Father     Social History   Socioeconomic History  . Marital status: Married    Spouse name: Not on file  . Number of children: 2  . Years of education: Not on file  . Highest education level: Not on file  Occupational History  . Occupation: Psychologist, forensic    Comment: Retired  Engineer, production  . Financial resource strain: Not on file  . Food insecurity:    Worry: Not on file    Inability: Not on file  . Transportation needs:    Medical: Not on file    Non-medical: Not on file  Tobacco Use  . Smoking status: Never Smoker  . Smokeless tobacco: Never Used  Substance and Sexual Activity  . Alcohol use: Yes    Alcohol/week: 1.0 standard drinks    Types: 1 Glasses of wine per week    Comment: 1  glass of wine per night  . Drug use: No  . Sexual activity: Not on file  Lifestyle  . Physical activity:    Days per week: Not on file    Minutes per session: Not on file  . Stress: Not on file  Relationships  . Social connections:    Talks on phone: Not on file    Gets together: Not on file    Attends religious service: Not on file    Active member of club or organization: Not on file    Attends meetings of clubs or organizations: Not on file    Relationship status: Not on file  . Intimate partner violence:    Fear of current or ex partner: Not on file    Emotionally abused: Not on file    Physically abused: Not on file    Forced sexual activity: Not on file  Other Topics Concern  . Not on file  Social History Narrative   Son and daughter      Has living will   Daughter is health care POA   Has DNR ---confirmed and rewritten 02/07/17   No tube     [facility]   Review of Systems No heartburn or dysphagia Appetite is good Weight stable Sleeps fine    Objective:   Physical Exam  Constitutional: No distress.  Very HOH  Neck: No thyromegaly present.  Cardiovascular: Normal rate, regular rhythm, normal heart sounds and intact distal pulses. Exam reveals no gallop.  No murmur heard. Respiratory: Effort normal and breath sounds normal. No respiratory distress. She has no wheezes. She has no rales.  GI: Soft.  Musculoskeletal: She exhibits no edema.  Lymphadenopathy:    She has no cervical adenopathy.  Neurological:  Continually repeating herself  Psychiatric: She has a normal mood and affect. Her behavior is normal.           Assessment & Plan:

## 2018-11-26 ENCOUNTER — Ambulatory Visit: Payer: Medicare HMO | Admitting: Internal Medicine

## 2018-11-26 DIAGNOSIS — I480 Paroxysmal atrial fibrillation: Secondary | ICD-10-CM

## 2018-11-26 DIAGNOSIS — E039 Hypothyroidism, unspecified: Secondary | ICD-10-CM

## 2018-11-26 DIAGNOSIS — F39 Unspecified mood [affective] disorder: Secondary | ICD-10-CM | POA: Diagnosis not present

## 2018-11-26 DIAGNOSIS — M1991 Primary osteoarthritis, unspecified site: Secondary | ICD-10-CM

## 2018-11-26 DIAGNOSIS — G3184 Mild cognitive impairment, so stated: Secondary | ICD-10-CM

## 2018-11-26 DIAGNOSIS — G479 Sleep disorder, unspecified: Secondary | ICD-10-CM

## 2018-12-05 ENCOUNTER — Encounter: Payer: Self-pay | Admitting: Internal Medicine

## 2018-12-05 DIAGNOSIS — G479 Sleep disorder, unspecified: Secondary | ICD-10-CM | POA: Insufficient documentation

## 2018-12-05 DIAGNOSIS — M1990 Unspecified osteoarthritis, unspecified site: Secondary | ICD-10-CM | POA: Insufficient documentation

## 2018-12-05 NOTE — Progress Notes (Signed)
Subjective:    Patient ID: Karen Colon, female    DOB: November 22, 1925, 85 y.o.   MRN: [id number]  HPI  Routine follow up for resident in [address] Reviewed with RN, no new concerns Resident denies concerns Sleeps well, independent with ADL's. Walks without device Appetite good, weight up 4 lbs. Denies urinary leakage. Intermittent constipation Denies pain, reflux or SOB  Hypothyroid: She is taking Nature Thyroid as prescribed.   AFib: She denies issues with palpitations. She is taking Diltiazem and ASA as prescribed. ECG from 10/2017 reviewed.  Vascular Dementia: Mild cognitive issues. On ASA.  Sleep Disturbance: Sleeps well with Trazodone.  Mood Disorder: Dysthymia. Stable on Lexapro.  OA: Pain controlled with schedule Tylenol.  Review of Systems      Past Medical History:  Diagnosis Date  . Allergic rhinitis   . Depressive disorder, not elsewhere classified   . Hearing loss    bilateral hearing aides  . History of MRI 9/10   Cardiac MRI, normal  . Hyperlipidemia   . Hypertension   . Hypothyroidism   . Insomnia, unspecified   . Osteopenia   . Paroxysmal A-fib (HCC) 2009   [facility]  . Teeth grinding   . Unspecified vitamin D deficiency     Current Outpatient Medications  Medication Sig Dispense Refill  . acidophilus (RISAQUAD) CAPS capsule Take 1 capsule by mouth daily.    Marland Kitchen albuterol (PROVENTIL HFA;VENTOLIN HFA) 108 (90 Base) MCG/ACT inhaler Inhale 2 puffs into the lungs every 6 (six) hours as needed for wheezing or shortness of breath. (Patient not taking: Reported on 04/30/2018) 1 Inhaler 0  . aspirin EC 81 MG tablet Take 81 mg by mouth daily.    Marland Kitchen diltiazem (CARDIZEM CD) 120 MG 24 hr capsule Take 1 capsule (120 mg total) by mouth daily. 30 capsule 0  . escitalopram (LEXAPRO) 5 MG tablet Take 1 tablet (5 mg total) by mouth daily. (Patient taking differently: Take 10 mg by mouth daily. ) 30 tablet 2  . polyethylene glycol powder (GLYCOLAX/MIRALAX) powder Take 17 g by  mouth daily. 3350 g 1  . thyroid (ARMOUR) 90 MG tablet Take 90 mg by mouth daily. At 7am    . traZODone (DESYREL) 100 MG tablet Take 100 mg by mouth at bedtime as needed for sleep.     No current facility-administered medications for this visit.     Allergies  Allergen Reactions  . Ciprofloxacin     Muscle spasm   . Iodinated Diagnostic Agents     Iodine DYe  . Other     Dust Mites    Family History  Problem Relation Age of Onset  . Heart disease Mother   . Pneumonia Father     Social History   Socioeconomic History  . Marital status: Married    Spouse name: Not on file  . Number of children: 2  . Years of education: Not on file  . Highest education level: Not on file  Occupational History  . Occupation: Psychologist, forensic    Comment: Retired  Engineer, production  . Financial resource strain: Not on file  . Food insecurity:    Worry: Not on file    Inability: Not on file  . Transportation needs:    Medical: Not on file    Non-medical: Not on file  Tobacco Use  . Smoking status: Never Smoker  . Smokeless tobacco: Never Used  Substance and Sexual Activity  . Alcohol use: Yes    Alcohol/week:  1.0 standard drinks    Types: 1 Glasses of wine per week    Comment: 1 glass of wine per night  . Drug use: No  . Sexual activity: Not on file  Lifestyle  . Physical activity:    Days per week: Not on file    Minutes per session: Not on file  . Stress: Not on file  Relationships  . Social connections:    Talks on phone: Not on file    Gets together: Not on file    Attends religious service: Not on file    Active member of club or organization: Not on file    Attends meetings of clubs or organizations: Not on file    Relationship status: Not on file  . Intimate partner violence:    Fear of current or ex partner: Not on file    Emotionally abused: Not on file    Physically abused: Not on file    Forced sexual activity: Not on file  Other Topics Concern  . Not on file   Social History Narrative   Son and daughter      Has living will   Daughter is health care POA   Has DNR ---confirmed and rewritten 02/07/17   No tube    [employer]     Constitutional: Denies fever, malaise, fatigue, headache or abrupt weight changes.  HEENT: Denies eye pain, eye redness, ear pain, ringing in the ears, wax buildup, runny nose, nasal congestion, bloody nose, or sore throat. Respiratory: Denies difficulty breathing, shortness of breath, cough or sputum production.   Cardiovascular: Denies chest pain, chest tightness, palpitations or swelling in the hands or feet.  Gastrointestinal: Pt reports intermittent constipation. Denies abdominal pain, bloating, diarrhea or blood in the stool.  GU: Denies urgency, frequency, pain with urination, burning sensation, blood in urine, odor or discharge. Musculoskeletal: Pt reports intermittent joint pain. Denies decrease in range of motion, difficulty with gait, muscle pain or joint swelling.  Skin: Denies redness, rashes, lesions or ulcercations.  Neurological: Denies dizziness, difficulty with memory, difficulty with speech or problems with balance and coordination.  Psych: Pt reports depression. Denies anxiety,  SI/HI.  No other specific complaints in a complete review of systems (except as listed in HPI above).  Objective:   Physical Exam    BP (!) 141/75   Pulse 71   Temp 97.8 F (36.6 C)   Resp 16   Wt 144 lb 12.8 oz (65.7 kg)   BMI 22.02 kg/m  Wt Readings from Last 3 Encounters:  12/05/18 144 lb 12.8 oz (65.7 kg)  08/14/18 139 lb 6.4 oz (63.2 kg)  05/28/18 135 lb 6.4 oz (61.4 kg)    General: Appears her stated age, well developed, well nourished in NAD. Cardiovascular: Normal rate and rhythm. S1,S2 noted.  No murmur, rubs or gallops noted. No JVD or BLE edema.  Pulmonary/Chest: Normal effort and positive vesicular breath sounds. No respiratory distress. No wheezes, rales or ronchi noted.  Abdomen: Soft and  nontender. Normal bowel sounds.  Musculoskeletal:  No difficulty with gait.  Neurological: Alert and oriented to person and place. HOH. Psychiatric: Mood and affect mildly flat.  BMET    Component Value Date/Time   NA 139 05/28/2018 1116   NA 144 11/10/2011 1018   K 3.4 (L) 05/28/2018 1116   K 4.1 11/10/2011 1018   CL 106 05/28/2018 1116   CL 107 11/10/2011 1018   CO2 24 05/28/2018 1116   CO2 26 11/10/2011  1018   GLUCOSE 91 05/28/2018 1116   GLUCOSE 74 11/10/2011 1018   BUN 15 05/28/2018 1116   BUN 16 11/10/2011 1018   CREATININE 0.60 05/28/2018 1116   CREATININE 0.58 06/09/2012 1346   CALCIUM 8.9 05/28/2018 1116   CALCIUM 8.7 11/10/2011 1018   GFRNONAA >60 05/28/2018 1116   GFRNONAA >60 11/10/2011 1018   GFRAA >60 05/28/2018 1116   GFRAA >60 11/10/2011 1018    Lipid Panel  No results found for: CHOL, TRIG, HDL, CHOLHDL, VLDL, LDLCALC  CBC    Component Value Date/Time   WBC 8.3 05/28/2018 1116   RBC 4.27 05/28/2018 1116   HGB 12.5 05/28/2018 1116   HGB 12.6 11/10/2011 1018   HCT 37.0 05/28/2018 1116   HCT 37.9 11/10/2011 1018   PLT 301 05/28/2018 1116   PLT 271 11/10/2011 1018   MCV 86.8 05/28/2018 1116   MCV 90 11/10/2011 1018   MCH 29.4 05/28/2018 1116   MCHC 33.9 05/28/2018 1116   RDW 14.6 (H) 05/28/2018 1116   RDW 14.7 (H) 11/10/2011 1018   LYMPHSABS 3.1 05/28/2018 1116   MONOABS 0.6 05/28/2018 1116   EOSABS 0.2 05/28/2018 1116   BASOSABS 0.1 05/28/2018 1116    Hgb A1C No results found for: HGBA1C        Assessment & Plan:

## 2018-12-05 NOTE — Assessment & Plan Note (Signed)
Lexapro wean not indicated at this time

## 2018-12-05 NOTE — Assessment & Plan Note (Signed)
Continue ASA Appreciate  ALF care 

## 2018-12-05 NOTE — Assessment & Plan Note (Signed)
Continue scheduled Tylenol 

## 2018-12-05 NOTE — Assessment & Plan Note (Signed)
Continue Nature Thyroid Will monitor yearly Free T4

## 2018-12-05 NOTE — Patient Instructions (Signed)
Atrial Fibrillation    Atrial fibrillation is a type of heartbeat that is irregular or fast (rapid). If you have this condition, your heart beats without any order. This makes it hard for your heart to pump blood in a normal way. Having this condition gives you more risk for stroke, heart failure, and other heart problems.  Atrial fibrillation may start all of a sudden and then stop on its own, or it may become a long-lasting problem.  What are the causes?  This condition may be caused by heart conditions, such as:  · High blood pressure.  · Heart failure.  · Heart valve disease.  · Heart surgery.  Other causes include:  · Pneumonia.  · Obstructive sleep apnea.  · Lung cancer.  · Thyroid disease.  · Drinking too much alcohol.  Sometimes the cause is not known.  What increases the risk?  You are more likely to develop this condition if:  · You smoke.  · You are older.  · You have diabetes.  · You are overweight.  · You have a family history of this condition.  · You exercise often and hard.  What are the signs or symptoms?  Common symptoms of this condition include:  · A feeling like your heart is beating very fast.  · Chest pain.  · Feeling short of breath.  · Feeling light-headed or weak.  · Getting tired easily.  Follow these instructions at home:  Medicines  · Take over-the-counter and prescription medicines only as told by your doctor.  · If your doctor gives you a blood-thinning medicine, take it exactly as told. Taking too much of it can cause bleeding. Taking too little of it does not protect you against clots. Clots can cause a stroke.  Lifestyle         · Do not use any tobacco products. These include cigarettes, chewing tobacco, and e-cigarettes. If you need help quitting, ask your doctor.  · Do not drink alcohol.  · Do not drink beverages that have caffeine. These include coffee, soda, and tea.  · Follow diet instructions as told by your doctor.  · Exercise regularly as told by your doctor.  General  instructions  · If you have a condition that causes breathing to stop for a short period of time (apnea), treat it as told by your doctor.  · Keep a healthy weight. Do not use diet pills unless your doctor says they are safe for you. Diet pills may make heart problems worse.  · Keep all follow-up visits as told by your doctor. This is important.  Contact a doctor if:  · You notice a change in the speed, rhythm, or strength of your heartbeat.  · You are taking a blood-thinning medicine and you see more bruising.  · You get tired more easily when you move or exercise.  · You have a sudden change in weight.  Get help right away if:    · You have pain in your chest or your belly (abdomen).  · You have trouble breathing.  · You have blood in your vomit, poop, or pee (urine).  · You have any signs of a stroke. "BE FAST" is an easy way to remember the main warning signs:  ? B - Balance. Signs are dizziness, sudden trouble walking, or loss of balance.  ? E - Eyes. Signs are trouble seeing or a change in how you see.  ? F - Face. Signs are sudden weakness   or loss of feeling in the face, or the face or eyelid drooping on one side.  ? A - Arms. Signs are weakness or loss of feeling in an arm. This happens suddenly and usually on one side of the body.  ? S - Speech. Signs are sudden trouble speaking, slurred speech, or trouble understanding what people say.  ? T - Time. Time to call emergency services. Write down what time symptoms started.  · You have other signs of a stroke, such as:  ? A sudden, very bad headache with no known cause.  ? Feeling sick to your stomach (nausea).  ? Throwing up (vomiting).  ? Jerky movements you cannot control (seizure).  These symptoms may be an emergency. Do not wait to see if the symptoms will go away. Get medical help right away. Call your local emergency services (911 in the [place] Do not drive yourself to the hospital.  Summary  · Atrial fibrillation is a type of heartbeat that is irregular  or fast (rapid).  · You are at higher risk of this condition if you smoke, are older, have diabetes, or are overweight.  · Follow your doctor's instructions about medicines, diet, exercise, and follow-up visits.  · Get help right away if you think that you have signs of a stroke.  This information is not intended to replace advice given to you by your health care provider. Make sure you discuss any questions you have with your health care provider.  Document Released: 06/19/2008 Document Revised: 11/01/2017 Document Reviewed: 11/01/2017  Elsevier Interactive Patient Education © 2019 [employer]

## 2018-12-05 NOTE — Assessment & Plan Note (Signed)
Continue Diltiazem and ASA Will monitor 

## 2018-12-05 NOTE — Assessment & Plan Note (Signed)
Continue Trazadone 

## 2019-02-05 ENCOUNTER — Ambulatory Visit: Payer: Medicare HMO | Admitting: Internal Medicine

## 2019-02-05 ENCOUNTER — Encounter: Payer: Self-pay | Admitting: Internal Medicine

## 2019-02-05 ENCOUNTER — Telehealth: Payer: Self-pay

## 2019-02-05 VITALS — BP 138/80 | HR 74 | Temp 98.2°F | Resp 16 | Wt 147.8 lb

## 2019-02-05 DIAGNOSIS — G3184 Mild cognitive impairment, so stated: Secondary | ICD-10-CM | POA: Diagnosis not present

## 2019-02-05 DIAGNOSIS — K5909 Other constipation: Secondary | ICD-10-CM | POA: Diagnosis not present

## 2019-02-05 DIAGNOSIS — I480 Paroxysmal atrial fibrillation: Secondary | ICD-10-CM | POA: Diagnosis not present

## 2019-02-05 DIAGNOSIS — F39 Unspecified mood [affective] disorder: Secondary | ICD-10-CM

## 2019-02-05 DIAGNOSIS — G479 Sleep disorder, unspecified: Secondary | ICD-10-CM

## 2019-02-05 NOTE — Telephone Encounter (Signed)
Called patient from recall list.  Patient nor patients husband could hear me on the phone.  After repeating myself about 4 times they asked if we could send them a letter.  Reaching out to [facility] to help with this.

## 2019-02-05 NOTE — Assessment & Plan Note (Signed)
Bowels more regular Did have some RUQ pain---no sure if this was related to her bowels or not (but not persistent)

## 2019-02-05 NOTE — Assessment & Plan Note (Signed)
Some mild depression and adjustment issues Will consider wean of SSRI---after addressing the trazodone

## 2019-02-05 NOTE — Progress Notes (Signed)
Subjective:    Patient ID: Karen Colon, female    DOB: 07/10/1926, 85 y.o.   MRN: [id number]  HPI Visit in assisted living apartment for review of chronic health conditions Reviewed status with [name] RN Husband is here also  She feels like she is doing well Ongoing memory issues but stable function Still independent with ADLs  No chest pain No palpitations No SOB No dizziness or syncope No edema  Mood has been fine No regular depression or anxiety Sleeps okay with the trazodone  Current Outpatient Medications on File Prior to Visit  Medication Sig Dispense Refill  . acetaminophen (TYLENOL) 80 MG suppository Place 80 mg rectally 2 (two) times daily.    Marland Kitchen acidophilus (RISAQUAD) CAPS capsule Take 1 capsule by mouth daily.    Marland Kitchen albuterol (PROVENTIL HFA;VENTOLIN HFA) 108 (90 Base) MCG/ACT inhaler Inhale 2 puffs into the lungs every 6 (six) hours as needed for wheezing or shortness of breath. 1 Inhaler 0  . aspirin EC 81 MG tablet Take 81 mg by mouth daily.    Marland Kitchen diltiazem (CARDIZEM CD) 120 MG 24 hr capsule Take 1 capsule (120 mg total) by mouth daily. 30 capsule 0  . escitalopram (LEXAPRO) 5 MG tablet Take 1 tablet (5 mg total) by mouth daily. (Patient taking differently: Take 10 mg by mouth daily. ) 30 tablet 2  . polyethylene glycol powder (GLYCOLAX/MIRALAX) powder Take 17 g by mouth daily. 3350 g 1  . thyroid (ARMOUR) 90 MG tablet Take 90 mg by mouth daily. At 7am    . traZODone (DESYREL) 100 MG tablet Take 100 mg by mouth at bedtime as needed for sleep.     No current facility-administered medications on file prior to visit.     Allergies  Allergen Reactions  . Ciprofloxacin     Muscle spasm   . Iodinated Diagnostic Agents     Iodine DYe  . Other     Dust Mites    Past Medical History:  Diagnosis Date  . Allergic rhinitis   . Depressive disorder, not elsewhere classified   . Hearing loss    bilateral hearing aides  . History of MRI 9/10   Cardiac MRI, normal   . Hyperlipidemia   . Hypertension   . Hypothyroidism   . Insomnia, unspecified   . Osteopenia   . Paroxysmal A-fib (HCC) 2009   [facility]  . Teeth grinding   . Unspecified vitamin D deficiency     Past Surgical History:  Procedure Laterality Date  . APPENDECTOMY  2009  . TONSILLECTOMY      Family History  Problem Relation Age of Onset  . Heart disease Mother   . Pneumonia Father     Social History   Socioeconomic History  . Marital status: Married    Spouse name: Not on file  . Number of children: 2  . Years of education: Not on file  . Highest education level: Not on file  Occupational History  . Occupation: Psychologist, forensic    Comment: Retired  Engineer, production  . Financial resource strain: Not on file  . Food insecurity:    Worry: Not on file    Inability: Not on file  . Transportation needs:    Medical: Not on file    Non-medical: Not on file  Tobacco Use  . Smoking status: Never Smoker  . Smokeless tobacco: Never Used  Substance and Sexual Activity  . Alcohol use: Yes    Alcohol/week: 1.0 standard drinks  Types: 1 Glasses of wine per week    Comment: 1 glass of wine per night  . Drug use: No  . Sexual activity: Not on file  Lifestyle  . Physical activity:    Days per week: Not on file    Minutes per session: Not on file  . Stress: Not on file  Relationships  . Social connections:    Talks on phone: Not on file    Gets together: Not on file    Attends religious service: Not on file    Active member of club or organization: Not on file    Attends meetings of clubs or organizations: Not on file    Relationship status: Not on file  . Intimate partner violence:    Fear of current or ex partner: Not on file    Emotionally abused: Not on file    Physically abused: Not on file    Forced sexual activity: Not on file  Other Topics Concern  . Not on file  Social History Narrative   Son and daughter      Has living will   Daughter is health care POA    Has DNR ---confirmed and rewritten 02/07/17   No tube    [facility] [employer]   Review of Systems Complains of rare vague RUQ discomfort. Not post prandial (not quite like her past abdominal symptoms) Bowels slow but move okay    Objective:   Physical Exam  Constitutional: She appears well-developed. No distress.  Neck: No thyromegaly present.  Cardiovascular: Normal rate, regular rhythm and normal heart sounds. Exam reveals no gallop.  No murmur heard. Respiratory: Effort normal and breath sounds normal. No respiratory distress. She has no wheezes. She has no rales.  GI: Soft. There is no abdominal tenderness.  Musculoskeletal:        General: No edema.  Lymphadenopathy:    She has no cervical adenopathy.  Neurological: She exhibits normal muscle tone. Coordination normal.  Engages fully but asks the same questions several times  Psychiatric: She has a normal mood and affect. Her behavior is normal.           Assessment & Plan:

## 2019-02-05 NOTE — Assessment & Plan Note (Signed)
Regular again Hopefully low burden ASA only

## 2019-02-05 NOTE — Assessment & Plan Note (Signed)
Seems some worse--but no functional changes Has supervision for meds, meals are provided, etc Doing well

## 2019-02-05 NOTE — Assessment & Plan Note (Signed)
Sleeping well Will try weaning the trazodone over time

## 2019-02-06 ENCOUNTER — Encounter: Payer: Self-pay | Admitting: Cardiovascular Disease

## 2019-05-20 ENCOUNTER — Other Ambulatory Visit: Payer: Self-pay

## 2019-05-20 ENCOUNTER — Ambulatory Visit: Payer: Medicare HMO | Admitting: Internal Medicine

## 2019-05-20 DIAGNOSIS — G479 Sleep disorder, unspecified: Secondary | ICD-10-CM | POA: Diagnosis not present

## 2019-05-20 DIAGNOSIS — F39 Unspecified mood [affective] disorder: Secondary | ICD-10-CM | POA: Diagnosis not present

## 2019-05-20 DIAGNOSIS — E039 Hypothyroidism, unspecified: Secondary | ICD-10-CM

## 2019-05-20 DIAGNOSIS — I480 Paroxysmal atrial fibrillation: Secondary | ICD-10-CM | POA: Diagnosis not present

## 2019-05-20 DIAGNOSIS — G3184 Mild cognitive impairment, so stated: Secondary | ICD-10-CM

## 2019-05-27 ENCOUNTER — Encounter: Payer: Self-pay | Admitting: Internal Medicine

## 2019-05-27 NOTE — Assessment & Plan Note (Signed)
Appreciate ALF care 

## 2019-05-27 NOTE — Assessment & Plan Note (Signed)
Stable on Trazadone

## 2019-05-27 NOTE — Progress Notes (Signed)
Subjective:    Patient ID: Karen Colon, female    DOB: 01-14-1926, 86 y.o.   MRN: [id number]  HPI  Saw resident in [address] for routine follow up. No new concerns from staff or resident Continues to do well Sleeps well, Independent with ADL's, walks without device. Appetite good, weight stable. No issues with bowel or bladder. Denies pain, reflux or SOB.  Afib, paroxysmal: Stable on Cardizem and ASA.  Hypothyroidism: No issues on her current dose of Armour Thyroid.  HTN: Controlled on Cardizem (mainly for afib).  Mood Disorder: Chronic dysthymia, intermittent anxiety. Stable on Lexapro. Sleeps well with Trazadone.  MCI: Stable off meds.   Review of Systems      Past Medical History:  Diagnosis Date  . Allergic rhinitis   . Depressive disorder, not elsewhere classified   . Hearing loss    bilateral hearing aides  . History of MRI 9/10   Cardiac MRI, normal  . Hyperlipidemia   . Hypertension   . Hypothyroidism   . Insomnia, unspecified   . Osteopenia   . Paroxysmal A-fib ([place]) 2009   [facility]  . Teeth grinding   . Unspecified vitamin D deficiency     Current Outpatient Medications  Medication Sig Dispense Refill  . acetaminophen (TYLENOL) 80 MG suppository Place 80 mg rectally 2 (two) times daily.    Marland Kitchen acidophilus (RISAQUAD) CAPS capsule Take 1 capsule by mouth daily.    Marland Kitchen albuterol (PROVENTIL HFA;VENTOLIN HFA) 108 (90 Base) MCG/ACT inhaler Inhale 2 puffs into the lungs every 6 (six) hours as needed for wheezing or shortness of breath. 1 Inhaler 0  . aspirin EC 81 MG tablet Take 81 mg by mouth daily.    Marland Kitchen diltiazem (CARDIZEM CD) 120 MG 24 hr capsule Take 1 capsule (120 mg total) by mouth daily. 30 capsule 0  . escitalopram (LEXAPRO) 5 MG tablet Take 1 tablet (5 mg total) by mouth daily. (Patient taking differently: Take 10 mg by mouth daily. ) 30 tablet 2  . polyethylene glycol powder (GLYCOLAX/MIRALAX) powder Take 17 g by mouth daily. 3350 g 1  . thyroid (ARMOUR) 90  MG tablet Take 90 mg by mouth daily. At 7am    . traZODone (DESYREL) 50 MG tablet Take 50 mg by mouth at bedtime.     No current facility-administered medications for this visit.     Allergies  Allergen Reactions  . Ciprofloxacin     Muscle spasm   . Iodinated Diagnostic Agents     Iodine DYe  . Other     Dust Mites    Family History  Problem Relation Age of Onset  . Heart disease Mother   . Pneumonia Father     Social History   Socioeconomic History  . Marital status: Married    Spouse name: Not on file  . Number of children: 2  . Years of education: Not on file  . Highest education level: Not on file  Occupational History  . 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 Financial resource strain: Not on file  . Food insecurity    Worry: Not on file    Inability: Not on file  . Transportation needs    Medical: Not on file    Non-medical: Not on file  Tobacco Use  . Smoking status: Never Smoker  . Smokeless tobacco: Never Used  Substance and Sexual Activity  . Alcohol use: Yes    Alcohol/week: 1.0 standard  drinks    Types: 1 Glasses of wine per week    Comment: 1 glass of wine per night  . Drug use: No  . Sexual activity: Not on file  Lifestyle  . Physical activity    Days per week: Not on file    Minutes per session: Not on file  . Stress: Not on file  Relationships  . Social Musicianconnections    Talks on phone: Not on file    Gets together: Not on file    Attends religious service: Not on file    Active member of club or organization: Not on file    Attends meetings of clubs or organizations: Not on file    Relationship status: Not on file  . Intimate partner violence    Fear of current or ex partner: Not on file    Emotionally abused: Not on file    Physically abused: Not on file    Forced sexual activity: Not on file  Other Topics Concern  . Not on file  Social History Narrative   Son and daughter      Has living will    Daughter is health care POA   Has DNR ---confirmed and rewritten 02/07/17   No tube    [employer]     Constitutional: Denies fever, malaise, fatigue, headache or abrupt weight changes.  HEENT: Denies eye pain, eye redness, ear pain, ringing in the ears, wax buildup, runny nose, nasal congestion, bloody nose, or sore throat. Respiratory: Denies difficulty breathing, shortness of breath, cough or sputum production.   Cardiovascular: Denies chest pain, chest tightness, palpitations or swelling in the hands or feet.  Gastrointestinal: Denies abdominal pain, bloating, constipation, diarrhea or blood in the stool.  GU: Denies urgency, frequency, pain with urination, burning sensation, blood in urine, odor or discharge. Musculoskeletal: Denies decrease in range of motion, difficulty with gait, muscle pain or joint pain and swelling.  Skin: Denies redness, rashes, lesions or ulcercations.  Neurological: Pt reports difficulty with memory. Denies dizziness,  difficulty with speech or problems with balance and coordination.  Psych: Pt has a history of anxiety and depression. Denies SI/HI.  No other specific complaints in a complete review of systems (except as listed in HPI above).  Objective:   Physical Exam  BP 128/68   Pulse 80   Temp 98.1 F (36.7 C)   Resp 16   Wt 151 lb (68.5 kg)   BMI 22.96 kg/m  Wt Readings from Last 3 Encounters:  05/27/19 151 lb (68.5 kg)  02/05/19 147 lb 12.8 oz (67 kg)  12/05/18 144 lb 12.8 oz (65.7 kg)    General: Appears her stated age, well developed, well nourished in NAD. Neck:  Neck supple, trachea midline. No masses, lumps present.  Cardiovascular: Normal rate with irregular rhythm. S1,S2 noted.  No murmur, rubs or gallops noted. No JVD or BLE edema. Pulmonary/Chest: Normal effort and positive vesicular breath sounds. No respiratory distress. No wheezes, rales or ronchi noted.  Abdomen: Soft and nontender. Normal bowel sounds. No distention or  masses noted.  Musculoskeletal:No difficulty with gait.  Neurological: Alert and oriented. HOH  Psychiatric: Mood and affect mildly flat. Behavior is normal. Judgment and thought content normal.    BMET    Component Value Date/Time   NA 139 05/28/2018 1116   NA 144 11/10/2011 1018   K 3.4 (L) 05/28/2018 1116   K 4.1 11/10/2011 1018   CL 106 05/28/2018 1116   CL 107 11/10/2011  1018   CO2 24 05/28/2018 1116   CO2 26 11/10/2011 1018   GLUCOSE 91 05/28/2018 1116   GLUCOSE 74 11/10/2011 1018   BUN 15 05/28/2018 1116   BUN 16 11/10/2011 1018   CREATININE 0.60 05/28/2018 1116   CREATININE 0.58 06/09/2012 1346   CALCIUM 8.9 05/28/2018 1116   CALCIUM 8.7 11/10/2011 1018   GFRNONAA >60 05/28/2018 1116   GFRNONAA >60 11/10/2011 1018   GFRAA >60 05/28/2018 1116   GFRAA >60 11/10/2011 1018    Lipid Panel  No results found for: CHOL, TRIG, HDL, CHOLHDL, VLDL, LDLCALC  CBC    Component Value Date/Time   WBC 8.3 05/28/2018 1116   RBC 4.27 05/28/2018 1116   HGB 12.5 05/28/2018 1116   HGB 12.6 11/10/2011 1018   HCT 37.0 05/28/2018 1116   HCT 37.9 11/10/2011 1018   PLT 301 05/28/2018 1116   PLT 271 11/10/2011 1018   MCV 86.8 05/28/2018 1116   MCV 90 11/10/2011 1018   MCH 29.4 05/28/2018 1116   MCHC 33.9 05/28/2018 1116   RDW 14.6 (H) 05/28/2018 1116   RDW 14.7 (H) 11/10/2011 1018   LYMPHSABS 3.1 05/28/2018 1116   MONOABS 0.6 05/28/2018 1116   EOSABS 0.2 05/28/2018 1116   BASOSABS 0.1 05/28/2018 1116    Hgb A1C No results found for: HGBA1C          Assessment & Plan:

## 2019-05-27 NOTE — Assessment & Plan Note (Signed)
Continue current dose of Lexapro Wean not indicated at this time

## 2019-05-27 NOTE — Assessment & Plan Note (Signed)
Continue Cardizem and ASA Monitor

## 2019-05-27 NOTE — Assessment & Plan Note (Signed)
Continue Nature Thyroid Monitor Free T4 yearly

## 2019-08-13 ENCOUNTER — Encounter: Payer: Self-pay | Admitting: Internal Medicine

## 2019-09-17 ENCOUNTER — Encounter: Payer: Self-pay | Admitting: Internal Medicine

## 2019-09-17 ENCOUNTER — Ambulatory Visit: Payer: Medicare HMO | Admitting: Internal Medicine

## 2019-09-17 ENCOUNTER — Other Ambulatory Visit: Payer: Self-pay

## 2019-09-17 VITALS — BP 140/66 | HR 65 | Temp 97.0°F | Resp 16 | Wt 158.0 lb

## 2019-09-17 DIAGNOSIS — F0150 Vascular dementia without behavioral disturbance: Secondary | ICD-10-CM

## 2019-09-17 DIAGNOSIS — E039 Hypothyroidism, unspecified: Secondary | ICD-10-CM | POA: Diagnosis not present

## 2019-09-17 DIAGNOSIS — G479 Sleep disorder, unspecified: Secondary | ICD-10-CM | POA: Diagnosis not present

## 2019-09-17 DIAGNOSIS — F39 Unspecified mood [affective] disorder: Secondary | ICD-10-CM

## 2019-09-17 DIAGNOSIS — I480 Paroxysmal atrial fibrillation: Secondary | ICD-10-CM

## 2019-09-17 NOTE — Progress Notes (Signed)
Subjective:    Patient ID: Karen Colon, female    DOB: 1925/10/04, 85 y.o.   MRN: [id number]  HPI Visit in [place] apartment for review of her chronic health conditions Husband is here Reviewed status with [facility]--makes communication difficult  She is slow to wake up in the morning Sleeping fine though--she didn't think she was on a sleeping med  No chest pain No SOB No dizziness or syncope No edema No palpitations  Maintains functional independence with husband's help (they both have cognitive issues) meds are provided and staff supervises Is able to get down to meals without assist (walks without aide)  Mood seems to be okay No regular sadness or depression  Current Outpatient Medications on File Prior to Visit  Medication Sig Dispense Refill  . acetaminophen (TYLENOL) 325 MG tablet Take 650 mg by mouth 2 (two) times daily.    Marland Kitchen acidophilus (RISAQUAD) CAPS capsule Take 1 capsule by mouth daily.    Marland Kitchen albuterol (PROVENTIL HFA;VENTOLIN HFA) 108 (90 Base) MCG/ACT inhaler Inhale 2 puffs into the lungs every 6 (six) hours as needed for wheezing or shortness of breath. 1 Inhaler 0  . Ascorbic Acid (VITAMIN C) 1000 MG tablet Take 1,000 mg by mouth daily.    Marland Kitchen aspirin EC 81 MG tablet Take 81 mg by mouth daily.    . Cholecalciferol (VITAMIN D3) 50 MCG (2000 UT) TABS Take 1 tablet by mouth daily.    Marland Kitchen diltiazem (CARDIZEM CD) 120 MG 24 hr capsule Take 1 capsule (120 mg total) by mouth daily. 30 capsule 0  . escitalopram (LEXAPRO) 10 MG tablet Take 10 mg by mouth daily.    Marland Kitchen loratadine (CLARITIN) 10 MG tablet Take 10 mg by mouth daily.    . polyethylene glycol powder (GLYCOLAX/MIRALAX) powder Take 17 g by mouth daily. 3350 g 1  . thyroid (ARMOUR) 90 MG tablet Take 90 mg by mouth daily. At 7am    . traZODone (DESYREL) 50 MG tablet Take 50 mg by mouth at bedtime.     No current facility-administered medications on file prior to visit.    Allergies  Allergen Reactions  .  Ciprofloxacin     Muscle spasm   . Iodinated Diagnostic Agents     Iodine DYe  . Other     Dust Mites    Past Medical History:  Diagnosis Date  . Allergic rhinitis   . Depressive disorder, not elsewhere classified   . Hearing loss    bilateral hearing aides  . History of MRI 9/10   Cardiac MRI, normal  . Hyperlipidemia   . Hypertension   . Hypothyroidism   . Insomnia, unspecified   . Osteopenia   . Paroxysmal A-fib ([facility]) 2009   [facility]  . Teeth grinding   . Unspecified vitamin D deficiency     Past Surgical History:  Procedure Laterality Date  . APPENDECTOMY  2009  . TONSILLECTOMY      Family History  Problem Relation Age of Onset  . Heart disease Mother   . Pneumonia Father     Social History   Socioeconomic History  . Marital status: Married    Spouse name: Not on file  . Number of children: 2  . Years of education: Not on file  . Highest education level: Not on file  Occupational History  . Occupation: Psychologist, forensic    Comment: Retired  Tobacco Use  . Smoking status: Never Smoker  . Smokeless tobacco: Never Used  Substance and Sexual Activity  . Alcohol use: Yes    Alcohol/week: 1.0 standard drinks    Types: 1 Glasses of wine per week    Comment: 1 glass of wine per night  . Drug use: No  . Sexual activity: Not on file  Other Topics Concern  . Not on file  Social History Narrative   Son and daughter      Has living will   Daughter is health care POA   Has DNR ---confirmed and rewritten 02/07/17   No tube    [facility]   Social Determinants of Health   Financial Resource Strain:   . Difficulty of Paying Living Expenses: Not on file  Food Insecurity:   . Worried About Charity fundraiser in the Last Year: Not on file  . Ran Out of Food in the Last Year: Not on file  Transportation Needs:   . Lack of Transportation (Medical): Not on file  . Lack of Transportation (Non-Medical): Not on file  Physical Activity:   . Days of  Exercise per Week: Not on file  . Minutes of Exercise per Session: Not on file  Stress:   . Feeling of Stress : Not on file  Social Connections:   . Frequency of Communication with Friends and Family: Not on file  . Frequency of Social Gatherings with Friends and Family: Not on file  . Attends Religious Services: Not on file  . Active Member of Clubs or Organizations: Not on file  . Attends Archivist Meetings: Not on file  . Marital Status: Not on file  Intimate Partner Violence:   . Fear of Current or Ex-Partner: Not on file  . Emotionally Abused: Not on file  . Physically Abused: Not on file  . Sexually Abused: Not on file   Review of Systems Appetite is okay Bowels are fine    Objective:   Physical Exam  Constitutional: She appears well-developed. No distress.  HOH--can't find hearing aide   Cardiovascular: Normal rate, regular rhythm and normal heart sounds. Exam reveals no gallop.  No murmur heard. Respiratory: Effort normal and breath sounds normal. No respiratory distress. She has no wheezes. She has no rales.  GI: Soft. There is no abdominal tenderness.  Musculoskeletal:        General: No edema.  Neurological: She is alert.  Psychiatric: She has a normal mood and affect. Her behavior is normal.           Assessment & Plan:

## 2019-09-17 NOTE — Assessment & Plan Note (Signed)
Clinically euthryoid Yearly labs

## 2019-09-17 NOTE — Assessment & Plan Note (Signed)
Mild functional decline but does well with the support here in [place] No meds for this

## 2019-09-17 NOTE — Assessment & Plan Note (Signed)
Seems to be sleeping fine Some AM tiredness---will try without the trazodone

## 2019-09-17 NOTE — Assessment & Plan Note (Signed)
Seems to have a low burden ASA only

## 2019-09-17 NOTE — Assessment & Plan Note (Signed)
Seems to be doing well Will try weaning the escitalopram next time--if tolerates coming off the trazodone

## 2019-11-25 ENCOUNTER — Other Ambulatory Visit: Payer: Self-pay

## 2019-11-25 ENCOUNTER — Ambulatory Visit: Payer: Medicare Other | Admitting: Internal Medicine

## 2019-11-25 DIAGNOSIS — E039 Hypothyroidism, unspecified: Secondary | ICD-10-CM | POA: Diagnosis not present

## 2019-11-25 DIAGNOSIS — F39 Unspecified mood [affective] disorder: Secondary | ICD-10-CM

## 2019-11-25 DIAGNOSIS — M1991 Primary osteoarthritis, unspecified site: Secondary | ICD-10-CM | POA: Diagnosis not present

## 2019-11-25 DIAGNOSIS — F0150 Vascular dementia without behavioral disturbance: Secondary | ICD-10-CM | POA: Diagnosis not present

## 2019-11-25 DIAGNOSIS — I480 Paroxysmal atrial fibrillation: Secondary | ICD-10-CM | POA: Diagnosis not present

## 2019-11-27 ENCOUNTER — Encounter: Payer: Self-pay | Admitting: Internal Medicine

## 2019-11-27 NOTE — Assessment & Plan Note (Signed)
Continue Escitalopram and Lorazepam- wean not indicated at this time

## 2019-11-27 NOTE — Patient Instructions (Signed)
Arthritis Arthritis means joint pain. It can also mean joint disease. A joint is a place where bones come together. There are more than 100 types of arthritis. What are the causes? This condition may be caused by:  Wear and tear of a joint. This is the most common cause.  A lot of acid in the blood, which leads to pain in the joint (gout).  Pain and swelling (inflammation) in a joint.  Infection of a joint.  Injuries in the joint.  A reaction to medicines (allergy). In some cases, the cause may not be known. What are the signs or symptoms? Symptoms of this condition include:  Redness at a joint.  Swelling at a joint.  Stiffness at a joint.  Warmth coming from the joint.  A fever.  A feeling of being sick. How is this treated? This condition may be treated with:  Treating the cause, if it is known.  Rest.  Raising (elevating) the joint.  Putting cold or hot packs on the joint.  Medicines to treat symptoms and reduce pain and swelling.  Shots of medicines (cortisone) into the joint. You may also be told to make changes in your life, such as doing exercises and losing weight. Follow these instructions at home: Medicines  Take over-the-counter and prescription medicines only as told by your doctor.  Do not take aspirin for pain if your doctor says that you may have gout. Activity  Rest your joint if your doctor tells you to.  Avoid activities that make the pain worse.  Exercise your joint regularly as told by your doctor. Try doing exercises like: ? Swimming. ? Water aerobics. ? Biking. ? Walking. Managing pain, stiffness, and swelling      If told, put ice on the affected area. ? Put ice in a plastic bag. ? Place a towel between your skin and the bag. ? Leave the ice on for 20 minutes, 2-3 times per day.  If your joint is swollen, raise (elevate) it above the level of your heart if told by your doctor.  If your joint feels stiff in the morning,  try taking a warm shower.  If told, put heat on the affected area. Do this as often as told by your doctor. Use the heat source that your doctor recommends, such as a moist heat pack or a heating pad. If you have diabetes, do not apply heat without asking your doctor. To apply heat: ? Place a towel between your skin and the heat source. ? Leave the heat on for 20-30 minutes. ? Remove the heat if your skin turns bright red. This is very important if you are unable to feel pain, heat, or cold. You may have a greater risk of getting burned. General instructions  Do not use any products that contain nicotine or tobacco, such as cigarettes, e-cigarettes, and chewing tobacco. If you need help quitting, ask your doctor.  Keep all follow-up visits as told by your doctor. This is important. Contact a doctor if:  The pain gets worse.  You have a fever. Get help right away if:  You have very bad pain in your joint.  You have swelling in your joint.  Your joint is red.  Many joints become painful and swollen.  You have very bad back pain.  Your leg is very weak.  You cannot control your pee (urine) or poop (stool). Summary  Arthritis means joint pain. It can also mean joint disease. A joint is a place   where bones come together.  The most common cause of this condition is wear and tear of a joint.  Symptoms of this condition include redness, swelling, or stiffness of the joint.  This condition is treated with rest, raising the joint, medicines, and putting cold or hot packs on the joint.  Follow your doctor's instructions about medicines, activity, exercises, and other home care treatments. This information is not intended to replace advice given to you by your health care provider. Make sure you discuss any questions you have with your health care provider. Document Revised: 08/18/2018 Document Reviewed: 08/18/2018 [employer]vier Patient Education  2020 [employer]  

## 2019-11-27 NOTE — Progress Notes (Signed)
Subjective:    Patient ID: Karen Colon, female    DOB: 1926/04/29, 86 y.o.   MRN: [id number]  HPI  Saw resident in [address] for routine follow up. RN reports residents daughter is asking for PT eval- no recent falls noted. Resident denies concerns She sleeps well. Walks with a cane. Independent with ADL's. Appetite good. Denies urinary incontinence. Bowels okay. She feels like her mood has been good. She denies chest pain, SOB or reflux.  OA: She denies reports of pain. She takes Tylenol scheduled with good relief of symptoms.  Hypothyroidism: She is taking Armour Thyroid as prescribed. Monitoring yearly Free T4.  Afib: Managed on Diltiazem and ASA. She is not following with cardiology.  Mood D/O: She denies any anxiety or depression. She is taking Escitalopram and Lorazepam (prn).  Vascular Dementia: Mild cognitive needs. On ASA.  Review of Systems      Past Medical History:  Diagnosis Date  . Allergic rhinitis   . Depressive disorder, not elsewhere classified   . Hearing loss    bilateral hearing aides  . History of MRI 9/10   Cardiac MRI, normal  . Hyperlipidemia   . Hypertension   . Hypothyroidism   . Insomnia, unspecified   . Osteopenia   . Paroxysmal A-fib (HCC) 2009   [facility]  . Teeth grinding   . Unspecified vitamin D deficiency     Current Outpatient Medications  Medication Sig Dispense Refill  . acetaminophen (TYLENOL) 325 MG tablet Take 650 mg by mouth 2 (two) times daily.    Marland Kitchen acidophilus (RISAQUAD) CAPS capsule Take 1 capsule by mouth daily.    Marland Kitchen albuterol (PROVENTIL HFA;VENTOLIN HFA) 108 (90 Base) MCG/ACT inhaler Inhale 2 puffs into the lungs every 6 (six) hours as needed for wheezing or shortness of breath. 1 Inhaler 0  . Ascorbic Acid (VITAMIN C) 1000 MG tablet Take 1,000 mg by mouth daily.    Marland Kitchen aspirin EC 81 MG tablet Take 81 mg by mouth daily.    . Cholecalciferol (VITAMIN D3) 50 MCG (2000 UT) TABS Take 1 tablet by mouth daily.    Marland Kitchen diltiazem  (CARDIZEM CD) 120 MG 24 hr capsule Take 1 capsule (120 mg total) by mouth daily. 30 capsule 0  . escitalopram (LEXAPRO) 10 MG tablet Take 10 mg by mouth daily.    Marland Kitchen loratadine (CLARITIN) 10 MG tablet Take 10 mg by mouth daily.    . polyethylene glycol powder (GLYCOLAX/MIRALAX) powder Take 17 g by mouth daily. 3350 g 1  . thyroid (ARMOUR) 90 MG tablet Take 90 mg by mouth daily. At 7am     No current facility-administered medications for this visit.    Allergies  Allergen Reactions  . Ciprofloxacin     Muscle spasm   . Iodinated Diagnostic Agents     Iodine DYe  . Other     Dust Mites    Family History  Problem Relation Age of Onset  . Heart disease Mother   . Pneumonia Father     Social History   Socioeconomic History  . Marital status: Married    Spouse name: Not on file  . Number of children: 2  . Years of education: Not on file  . Highest education level: Not on file  Occupational History  . Occupation: Psychologist, forensic    Comment: Retired  Tobacco Use  . Smoking status: Never Smoker  . Smokeless tobacco: Never Used  Substance and Sexual Activity  . Alcohol use: Yes  Alcohol/week: 1.0 standard drinks    Types: 1 Glasses of wine per week    Comment: 1 glass of wine per night  . Drug use: No  . Sexual activity: Not on file  Other Topics Concern  . Not on file  Social History Narrative   Son and daughter      Has living will   Daughter is health care POA   Has DNR ---confirmed and rewritten 02/07/17   No tube    [employer] System   Social Determinants of Health   Financial Resource Strain:   . Difficulty of Paying Living Expenses: Not on file  Food Insecurity:   . Worried About Charity fundraiser in the Last Year: Not on file  . Ran Out of Food in the Last Year: Not on file  Transportation Needs:   . Lack of Transportation (Medical): Not on file  . Lack of Transportation (Non-Medical): Not on file  Physical Activity:   . Days of Exercise per  Week: Not on file  . Minutes of Exercise per Session: Not on file  Stress:   . Feeling of Stress : Not on file  Social Connections:   . Frequency of Communication with Friends and Family: Not on file  . Frequency of Social Gatherings with Friends and Family: Not on file  . Attends Religious Services: Not on file  . Active Member of Clubs or Organizations: Not on file  . Attends Archivist Meetings: Not on file  . Marital Status: Not on file  Intimate Partner Violence:   . Fear of Current or Ex-Partner: Not on file  . Emotionally Abused: Not on file  . Physically Abused: Not on file  . Sexually Abused: Not on file     Constitutional: Denies fever, malaise, fatigue, headache or abrupt weight changes.  Respiratory: Denies difficulty breathing, shortness of breath, cough or sputum production.   Cardiovascular: Denies chest pain, chest tightness, palpitations or swelling in the hands or feet.  Gastrointestinal: Denies abdominal pain, bloating, constipation, diarrhea or blood in the stool.  GU: Denies urgency, frequency, pain with urination, burning sensation, blood in urine, odor or discharge. Musculoskeletal: Denies decrease in range of motion, difficulty with gait, muscle pain or joint pain and swelling.  Skin: Denies redness, rashes, lesions or ulcercations.  Neurological: Pt has difficulty with memory. Denies dizziness, difficulty with speech or problems with balance and coordination.  Psych: Pt has a history of depression. Denies anxiety, SI/HI.  No other specific complaints in a complete review of systems (except as listed in HPI above).  Objective:   Physical Exam  BP 136/74   Pulse 72   Temp 97.8 F (36.6 C)  Wt Readings from Last 3 Encounters:  09/17/19 158 lb (71.7 kg)  05/27/19 151 lb (68.5 kg)  02/05/19 147 lb 12.8 oz (67 kg)    General: Appears her stated age, well developed, well nourished in NAD. Skin: Warm, dry and intact. No rashes   noted. Cardiovascular: Normal rate and rhythm. S1,S2 noted.  No murmur, rubs or gallops noted. No JVD or BLE edema.  Pulmonary/Chest: Normal effort and positive vesicular breath sounds. No respiratory distress. No wheezes, rales or ronchi noted.  Abdomen: Soft and nontender. Normal bowel sounds. Neurological: Alert. Engages fully. HOH. Psychiatric: Mood and affect normal.   BMET    Component Value Date/Time   NA 139 05/28/2018 1116   NA 144 11/10/2011 1018   K 3.4 (L) 05/28/2018 1116   K 4.1  11/10/2011 1018   CL 106 05/28/2018 1116   CL 107 11/10/2011 1018   CO2 24 05/28/2018 1116   CO2 26 11/10/2011 1018   GLUCOSE 91 05/28/2018 1116   GLUCOSE 74 11/10/2011 1018   BUN 15 05/28/2018 1116   BUN 16 11/10/2011 1018   CREATININE 0.60 05/28/2018 1116   CREATININE 0.58 06/09/2012 1346   CALCIUM 8.9 05/28/2018 1116   CALCIUM 8.7 11/10/2011 1018   GFRNONAA >60 05/28/2018 1116   GFRNONAA >60 11/10/2011 1018   GFRAA >60 05/28/2018 1116   GFRAA >60 11/10/2011 1018    Lipid Panel  No results found for: CHOL, TRIG, HDL, CHOLHDL, VLDL, LDLCALC  CBC    Component Value Date/Time   WBC 8.3 05/28/2018 1116   RBC 4.27 05/28/2018 1116   HGB 12.5 05/28/2018 1116   HGB 12.6 11/10/2011 1018   HCT 37.0 05/28/2018 1116   HCT 37.9 11/10/2011 1018   PLT 301 05/28/2018 1116   PLT 271 11/10/2011 1018   MCV 86.8 05/28/2018 1116   MCV 90 11/10/2011 1018   MCH 29.4 05/28/2018 1116   MCHC 33.9 05/28/2018 1116   RDW 14.6 (H) 05/28/2018 1116   RDW 14.7 (H) 11/10/2011 1018   LYMPHSABS 3.1 05/28/2018 1116   MONOABS 0.6 05/28/2018 1116   EOSABS 0.2 05/28/2018 1116   BASOSABS 0.1 05/28/2018 1116    Hgb A1C No results found for: HGBA1C        Assessment & Plan:

## 2019-11-27 NOTE — Assessment & Plan Note (Signed)
Continue Diltiazem and ASA Will monitor

## 2019-11-27 NOTE — Assessment & Plan Note (Signed)
Continue Armour Thyroid Monitor Free T4 yearly

## 2019-11-27 NOTE — Assessment & Plan Note (Signed)
Continue Tylenol.  

## 2019-11-27 NOTE — Assessment & Plan Note (Signed)
Continue ASA Appreciate  ALF care 

## 2019-12-25 ENCOUNTER — Emergency Department
Admission: EM | Admit: 2019-12-25 | Discharge: 2019-12-25 | Disposition: A | Discharge status: Home / Self Care | Payer: Medicare Other | Attending: Emergency Medicine | Admitting: Emergency Medicine

## 2019-12-25 ENCOUNTER — Emergency Department: Payer: Medicare Other

## 2019-12-25 ENCOUNTER — Encounter: Payer: Self-pay | Admitting: Emergency Medicine

## 2019-12-25 ENCOUNTER — Other Ambulatory Visit: Payer: Self-pay

## 2019-12-25 DIAGNOSIS — F0150 Vascular dementia without behavioral disturbance: Secondary | ICD-10-CM | POA: Insufficient documentation

## 2019-12-25 DIAGNOSIS — R410 Disorientation, unspecified: Secondary | ICD-10-CM | POA: Diagnosis not present

## 2019-12-25 DIAGNOSIS — I10 Essential (primary) hypertension: Secondary | ICD-10-CM | POA: Diagnosis not present

## 2019-12-25 DIAGNOSIS — M25571 Pain in right ankle and joints of right foot: Secondary | ICD-10-CM | POA: Insufficient documentation

## 2019-12-25 DIAGNOSIS — R404 Transient alteration of awareness: Secondary | ICD-10-CM | POA: Diagnosis not present

## 2019-12-25 DIAGNOSIS — M25519 Pain in unspecified shoulder: Secondary | ICD-10-CM | POA: Diagnosis not present

## 2019-12-25 DIAGNOSIS — W010XXA Fall on same level from slipping, tripping and stumbling without subsequent striking against object, initial encounter: Secondary | ICD-10-CM | POA: Insufficient documentation

## 2019-12-25 DIAGNOSIS — E039 Hypothyroidism, unspecified: Secondary | ICD-10-CM | POA: Diagnosis not present

## 2019-12-25 DIAGNOSIS — Y92129 Unspecified place in nursing home as the place of occurrence of the external cause: Secondary | ICD-10-CM | POA: Diagnosis not present

## 2019-12-25 DIAGNOSIS — W19XXXA Unspecified fall, initial encounter: Secondary | ICD-10-CM

## 2019-12-25 DIAGNOSIS — Y999 Unspecified external cause status: Secondary | ICD-10-CM | POA: Diagnosis not present

## 2019-12-25 DIAGNOSIS — Y939 Activity, unspecified: Secondary | ICD-10-CM | POA: Diagnosis not present

## 2019-12-25 DIAGNOSIS — S92351A Displaced fracture of fifth metatarsal bone, right foot, initial encounter for closed fracture: Secondary | ICD-10-CM | POA: Diagnosis not present

## 2019-12-25 DIAGNOSIS — R52 Pain, unspecified: Secondary | ICD-10-CM | POA: Diagnosis not present

## 2019-12-25 DIAGNOSIS — S0990XA Unspecified injury of head, initial encounter: Secondary | ICD-10-CM | POA: Diagnosis not present

## 2019-12-25 DIAGNOSIS — M25511 Pain in right shoulder: Secondary | ICD-10-CM | POA: Insufficient documentation

## 2019-12-25 MED ORDER — ACETAMINOPHEN 325 MG PO TABS: 650.0000 mg | ORAL_TABLET | Freq: Once | ORAL | Status: DC

## 2019-12-25 NOTE — ED Triage Notes (Signed)
Pt presents from [facility] assisted living via [facility] with c/o unwitnessed fall. Pt c/o right shoulder and right ankle pain. Bruising and swelling noted to right ankle. Pt has hx of dementia, but currently alert and oriented to name and time.

## 2019-12-25 NOTE — ED Notes (Signed)
Pt to xray

## 2019-12-25 NOTE — ED Notes (Signed)
This RN attempted to contact daughter with patient permission. No answer at this time.

## 2019-12-25 NOTE — ED Notes (Signed)
E-signature not working at this time. Pt verbalized understanding of D/C instructions, prescriptions and follow up care with no further questions at this time. Pt daughter at bedside to pick pt up. dispo instructions discussed with daughter who verbalized no further questions at this time. Pt wheeled to daughters car. Pt daughter to take pt back to [facility].

## 2019-12-25 NOTE — ED Provider Notes (Signed)
[facility] Emergency Department Provider Note       Time seen: ----------------------------------------- 3:29 PM on 12/25/2019 ----------------------------------------- Level V caveat: History/ROS limited by dementia  I have reviewed the triage vital signs and the nursing notes.  HISTORY   Chief Complaint No chief complaint on file.   HPI Karen Colon is a 86 y.o. female with a history of depression, hyperlipidemia, hypertension, hypothyroidism, paroxysmal atrial fibrillation who presents to the ED for an unwitnessed fall at [facility].  Patient reportedly was found on the floor, is complaining of right shoulder pain.  Right ankle was also noted to be bruised.  No other information is available due to her dementia.  Past Medical History:  Diagnosis Date  . Allergic rhinitis   . Depressive disorder, not elsewhere classified   . Hearing loss    bilateral hearing aides  . History of MRI 9/10   Cardiac MRI, normal  . Hyperlipidemia   . Hypertension   . Hypothyroidism   . Insomnia, unspecified   . Osteopenia   . Paroxysmal A-fib ([place]) 2009   [facility]  . Teeth grinding   . Unspecified vitamin D deficiency     Patient Active Problem List   Diagnosis Date Noted  . OA (osteoarthritis) 12/05/2018  . Mood disorder ([place]) 02/07/2017  . Vascular dementia without behavioral disturbance ([place]) 06/25/2012  . Hypothyroidism 06/09/2012  . Allergic rhinitis 06/09/2012  . Atrial fibrillation ([place]) 06/12/2011    Past Surgical History:  Procedure Laterality Date  . APPENDECTOMY  2009  . TONSILLECTOMY      Allergies Ciprofloxacin, Iodinated diagnostic agents, and Other  Social History Social History   Tobacco Use  . Smoking status: Never Smoker  . Smokeless tobacco: Never Used  Substance Use Topics  . Alcohol use: Yes    Alcohol/week: 1.0 standard drinks    Types: 1 Glasses of wine per week    Comment: 1 glass of wine per night  . Drug use: No     Review of Systems Positive for right arm pain, right ankle bruising  All systems negative/normal/unremarkable except as stated in the HPI  ____________________________________________   PHYSICAL EXAM:  VITAL SIGNS: ED Triage Vitals  Enc Vitals Group     BP      Pulse      Resp      Temp      Temp src      SpO2      Weight      Height      Head Circumference      Peak Flow      Pain Score      Pain Loc      Pain Edu?      Excl. in [place]?     Constitutional: Alert but disoriented. Well appearing and in no distress. Eyes: Conjunctivae are normal. Normal extraocular movements. ENT      Head: Normocephalic and atraumatic.      Nose: No congestion/rhinnorhea.      Mouth/Throat: Mucous membranes are moist.      Neck: No stridor. Cardiovascular: Normal rate, regular rhythm. No murmurs, rubs, or gallops. Respiratory: Normal respiratory effort without tachypnea nor retractions. Breath sounds are clear and equal bilaterally. No wheezes/rales/rhonchi. Gastrointestinal: Soft and nontender. Normal bowel sounds Musculoskeletal: Pain with range of motion of the right shoulder, no obvious effusion is present.  Right dorsal lateral ankle swelling and ecchymosis is also noted Neurologic:  Normal speech and language. No gross focal neurologic deficits  are appreciated.  Skin: Ecchymosis right ankle Psychiatric: Mood and affect are normal. ___________________________________________  ED COURSE:  As part of my medical decision making, I reviewed the following data within the electronic MEDICAL RECORD NUMBER History obtained from family if available, nursing notes, old chart and ekg, as well as notes from prior ED visits. Patient presented for an unwitnessed fall, we will assess with labs and imaging as indicated at this time.   Procedures  Karen Colon was evaluated in Emergency Department on 12/25/2019 for the symptoms described in the history of present illness. She was evaluated in the  context of the global COVID-19 pandemic, which necessitated consideration that the patient might be at risk for infection with the SARS-CoV-2 virus that causes COVID-19. Institutional protocols and algorithms that pertain to the evaluation of patients at risk for COVID-19 are in a state of rapid change based on information released by regulatory bodies including the CDC and federal and state organizations. These policies and algorithms were followed during the patient's care in the ED.  ____________________________________________   RADIOLOGY Images were viewed by me  CT head, right shoulder x-ray, right ankle x-ray  IMPRESSION:  1. No acute intracranial findings.  2. Periventricular white matter and corona radiata hypodensities  favor chronic ischemic microvascular white matter disease.  3. Chronic ethmoid sinusitis.  IMPRESSION:  No acute fracture.   IMPRESSION:  1. Suspected nondisplaced fracture at the base of the fifth  metatarsal.  2. No additional fracture of the ankle.  ____________________________________________   DIFFERENTIAL DIAGNOSIS   Fall, contusion, fracture, sprain, dementia  FINAL ASSESSMENT AND PLAN  Fall, possible nondisplaced fifth metatarsal fracture, shoulder pain   Plan: The patient had presented for an unwitnessed fall.  Patient's imaging revealed no abnormalities about the shoulder or intracranially.  She does have a suspected nondisplaced fracture at the base of the fifth metatarsal.  She has been placed in a postop shoe and will be referred to orthopedics for outpatient follow-up.   Ulice Dash, MD    Note: This note was generated in part or whole with voice recognition software. Voice recognition is usually quite accurate but there are transcription errors that can and very often do occur. I apologize for any typographical errors that were not detected and corrected.     Emily Filbert, MD 12/25/19 [phone]

## 2019-12-25 NOTE — ED Notes (Signed)
This RN attempted to call report to [facility] with no answer. This RN left voice message for supervisor with patient permission.

## 2019-12-29 DIAGNOSIS — F0150 Vascular dementia without behavioral disturbance: Secondary | ICD-10-CM | POA: Diagnosis not present

## 2019-12-29 DIAGNOSIS — S9031XA Contusion of right foot, initial encounter: Secondary | ICD-10-CM | POA: Diagnosis not present

## 2019-12-29 DIAGNOSIS — M25511 Pain in right shoulder: Secondary | ICD-10-CM | POA: Diagnosis not present

## 2019-12-29 DIAGNOSIS — W19XXXA Unspecified fall, initial encounter: Secondary | ICD-10-CM | POA: Diagnosis not present

## 2020-02-04 ENCOUNTER — Ambulatory Visit: Payer: Medicare Other | Admitting: Internal Medicine

## 2020-02-04 ENCOUNTER — Encounter: Payer: Self-pay | Admitting: Internal Medicine

## 2020-02-04 ENCOUNTER — Other Ambulatory Visit: Payer: Self-pay

## 2020-02-04 DIAGNOSIS — I480 Paroxysmal atrial fibrillation: Secondary | ICD-10-CM

## 2020-02-04 DIAGNOSIS — E039 Hypothyroidism, unspecified: Secondary | ICD-10-CM | POA: Diagnosis not present

## 2020-02-04 DIAGNOSIS — F39 Unspecified mood [affective] disorder: Secondary | ICD-10-CM

## 2020-02-04 DIAGNOSIS — F0150 Vascular dementia without behavioral disturbance: Secondary | ICD-10-CM

## 2020-02-04 NOTE — Assessment & Plan Note (Signed)
Seems to have low burden on diltiazem Is just on ASA

## 2020-02-04 NOTE — Progress Notes (Signed)
Subjective:    Patient ID: Karen Colon, female    DOB: 1926/08/24, 86 y.o.   MRN: [id number]  HPI Visit in assisted living apartment for follow up of chronic medical conditions Reviewed status with [name] RN Husband is here as usual  Did have a fall with ER visit last month Back to her usual status Walks with rollator Not satisfied with the aide they have---will review with [name] The aide does have to encourage her to get up, get dressed, etc(this is likely why she is not happy with her)  No chest pain No palpitations No dizziness or syncope No sig edema  Memory issues seems stable Difficult with the severe hearing loss  Mood okay other than frustration with the aide No regular depression  Some degree of anxiety  Current Outpatient Medications on File Prior to Visit  Medication Sig Dispense Refill  . acetaminophen (TYLENOL) 325 MG tablet Take 650 mg by mouth 2 (two) times daily.    Marland Kitchen acidophilus (RISAQUAD) CAPS capsule Take 1 capsule by mouth daily.    Marland Kitchen albuterol (PROVENTIL HFA;VENTOLIN HFA) 108 (90 Base) MCG/ACT inhaler Inhale 2 puffs into the lungs every 6 (six) hours as needed for wheezing or shortness of breath. 1 Inhaler 0  . Ascorbic Acid (VITAMIN C) 1000 MG tablet Take 1,000 mg by mouth daily.    Marland Kitchen aspirin EC 81 MG tablet Take 81 mg by mouth daily.    . Cholecalciferol (VITAMIN D3) 50 MCG (2000 UT) TABS Take 1 tablet by mouth daily.    Marland Kitchen diltiazem (CARDIZEM CD) 120 MG 24 hr capsule Take 1 capsule (120 mg total) by mouth daily. 30 capsule 0  . escitalopram (LEXAPRO) 10 MG tablet Take 10 mg by mouth daily.    Marland Kitchen loratadine (CLARITIN) 10 MG tablet Take 10 mg by mouth daily.    . polyethylene glycol powder (GLYCOLAX/MIRALAX) powder Take 17 g by mouth daily. 3350 g 1  . thyroid (ARMOUR) 90 MG tablet Take 90 mg by mouth daily. At 7am     No current facility-administered medications on file prior to visit.    Allergies  Allergen Reactions  . Ciprofloxacin     Muscle  spasm   . Iodinated Diagnostic Agents     Iodine DYe  . Other     Dust Mites    Past Medical History:  Diagnosis Date  . Allergic rhinitis   . Depressive disorder, not elsewhere classified   . Hearing loss    bilateral hearing aides  . History of MRI 9/10   Cardiac MRI, normal  . Hyperlipidemia   . Hypertension   . Hypothyroidism   . Insomnia, unspecified   . Osteopenia   . Paroxysmal A-fib (HCC) 2009   [facility]  . Teeth grinding   . Unspecified vitamin D deficiency     Past Surgical History:  Procedure Laterality Date  . APPENDECTOMY  2009  . TONSILLECTOMY      Family History  Problem Relation Age of Onset  . Heart disease Mother   . Pneumonia Father     Social History   Socioeconomic History  . Marital status: Married    Spouse name: Not on file  . Number of children: 2  . Years of education: Not on file  . Highest education level: Not on file  Occupational History  . Occupation: Psychologist, forensic    Comment: Retired  Tobacco Use  . Smoking status: Never Smoker  . Smokeless tobacco: Never Used  Substance  and Sexual Activity  . Alcohol use: Yes    Alcohol/week: 1.0 standard drinks    Types: 1 Glasses of wine per week    Comment: 1 glass of wine per night  . Drug use: No  . Sexual activity: Not on file  Other Topics Concern  . Not on file  Social History Narrative   Son and daughter      Has living will   Daughter is health care POA   Has DNR ---confirmed and rewritten 02/07/17   No tube    [employer] System   Social Determinants of Health   Financial Resource Strain:   . Difficulty of Paying Living Expenses:   Food Insecurity:   . Worried About Charity fundraiser in the Last Year:   . Arboriculturist in the Last Year:   Transportation Needs:   . Film/video editor (Medical):   Marland Kitchen Lack of Transportation (Non-Medical):   Physical Activity:   . Days of Exercise per Week:   . Minutes of Exercise per Session:   Stress:   . Feeling of  Stress :   Social Connections:   . Frequency of Communication with Friends and Family:   . Frequency of Social Gatherings with Friends and Family:   . Attends Religious Services:   . Active Member of Clubs or Organizations:   . Attends Archivist Meetings:   Marland Kitchen Marital Status:   Intimate Partner Violence:   . Fear of Current or Ex-Partner:   . Emotionally Abused:   Marland Kitchen Physically Abused:   . Sexually Abused:    Review of Systems Appetite is good Weight up slightly Sleeps okay--has been off the trazodone    Objective:   Physical Exam  Constitutional: She appears well-developed. No distress.  Neck: No thyromegaly present.  Cardiovascular: Normal rate, regular rhythm and normal heart sounds. Exam reveals no gallop.  No murmur heard. Rare extra beat  Respiratory: Effort normal and breath sounds normal. No respiratory distress. She has no wheezes. She has no rales.  GI: Soft. There is no abdominal tenderness.  Musculoskeletal:        General: No edema.  Lymphadenopathy:    She has no cervical adenopathy.  Psychiatric: She has a normal mood and affect. Her behavior is normal.           Assessment & Plan:

## 2020-02-04 NOTE — Assessment & Plan Note (Signed)
Mild Still doing okay here in [place] with her husband Aide needed to motivate both of them to get up and moving---likely why she doesn't like her

## 2020-02-04 NOTE — Assessment & Plan Note (Signed)
Seems to be euthyroid on the armour thyroid

## 2020-02-04 NOTE — Assessment & Plan Note (Signed)
Lacks motivation but not really depressed Aide is helping them both, urging them to walk outside, etc Continues on the escitalopram

## 2020-04-21 ENCOUNTER — Encounter: Payer: Self-pay | Admitting: Internal Medicine

## 2020-04-29 ENCOUNTER — Ambulatory Visit: Payer: Medicare Other | Admitting: Internal Medicine

## 2020-04-29 ENCOUNTER — Other Ambulatory Visit: Payer: Self-pay

## 2020-04-29 DIAGNOSIS — F39 Unspecified mood [affective] disorder: Secondary | ICD-10-CM

## 2020-04-29 DIAGNOSIS — M1991 Primary osteoarthritis, unspecified site: Secondary | ICD-10-CM | POA: Diagnosis not present

## 2020-04-29 DIAGNOSIS — F0150 Vascular dementia without behavioral disturbance: Secondary | ICD-10-CM | POA: Diagnosis not present

## 2020-04-29 DIAGNOSIS — E039 Hypothyroidism, unspecified: Secondary | ICD-10-CM | POA: Diagnosis not present

## 2020-04-29 DIAGNOSIS — I480 Paroxysmal atrial fibrillation: Secondary | ICD-10-CM

## 2020-05-02 ENCOUNTER — Encounter: Payer: Self-pay | Admitting: Internal Medicine

## 2020-05-02 NOTE — Assessment & Plan Note (Signed)
Continue scheduled Tylenol 

## 2020-05-02 NOTE — Assessment & Plan Note (Signed)
Continue Armour Thyroid We will check free T4 annually

## 2020-05-02 NOTE — Progress Notes (Signed)
Subjective:    Patient ID: Karen Colon, female    DOB: 1925/10/22, 85 y.o.   MRN: [id number]  HPI  Resident seen in [address] for routine followup No new concerns from staff. Resident requesting nail trim. Nails thick, long getting caught on socks causing pain. She continues to do well.  She sleeps well.  She is independent with ADLs.  She walks without a device.  Her appetite is good, weight is stable.  Her bowels are moving fine.  She denies concerns with mood or memory at this time.  She denies chest pain, reflux or shortness of breath.  OA: Managed with scheduled Tylenol.  Hypothyroidism: She denies any issues on her current dose of Armour Thyroid.  Afib: Managed on Diltiazem and Aspirin.  She does not follow with cardiology.  Mood Disorder: Managed with Escitalopram and Lorazepam.   Vascular Dementia: Mild cognitive needs, stable functional needs.  Managed on aspirin.  Review of Systems  Past Medical History:  Diagnosis Date  . Allergic rhinitis   . Depressive disorder, not elsewhere classified   . Hearing loss    bilateral hearing aides  . History of MRI 9/10   Cardiac MRI, normal  . Hyperlipidemia   . Hypertension   . Hypothyroidism   . Insomnia, unspecified   . Osteopenia   . Paroxysmal A-fib (HCC) 2009   [facility]  . Teeth grinding   . Unspecified vitamin D deficiency     Current Outpatient Medications  Medication Sig Dispense Refill  . acetaminophen (TYLENOL) 325 MG tablet Take 650 mg by mouth 2 (two) times daily.    Marland Kitchen acidophilus (RISAQUAD) CAPS capsule Take 1 capsule by mouth daily.    Marland Kitchen albuterol (PROVENTIL HFA;VENTOLIN HFA) 108 (90 Base) MCG/ACT inhaler Inhale 2 puffs into the lungs every 6 (six) hours as needed for wheezing or shortness of breath. 1 Inhaler 0  . Ascorbic Acid (VITAMIN C) 1000 MG tablet Take 1,000 mg by mouth daily.    Marland Kitchen aspirin EC 81 MG tablet Take 81 mg by mouth daily.    . Cholecalciferol (VITAMIN D3) 50 MCG (2000 UT) TABS Take 1 tablet by  mouth daily.    Marland Kitchen diltiazem (CARDIZEM CD) 120 MG 24 hr capsule Take 1 capsule (120 mg total) by mouth daily. 30 capsule 0  . escitalopram (LEXAPRO) 10 MG tablet Take 10 mg by mouth daily.    Marland Kitchen loratadine (CLARITIN) 10 MG tablet Take 10 mg by mouth daily.    . polyethylene glycol powder (GLYCOLAX/MIRALAX) powder Take 17 g by mouth daily. 3350 g 1  . thyroid (ARMOUR) 90 MG tablet Take 90 mg by mouth daily. At 7am     No current facility-administered medications for this visit.    Allergies  Allergen Reactions  . Ciprofloxacin     Muscle spasm   . Iodinated Diagnostic Agents     Iodine DYe  . Other     Dust Mites    Family History  Problem Relation Age of Onset  . Heart disease Mother   . Pneumonia Father     Social History   Socioeconomic History  . Marital status: Married    Spouse name: Not on file  . Number of children: 2  . Years of education: Not on file  . Highest education level: Not on file  Occupational History  . Occupation: Psychologist, forensic    Comment: Retired  Tobacco Use  . Smoking status: Never Smoker  . Smokeless tobacco: Never Used  Substance and Sexual Activity  . Alcohol use: Yes    Alcohol/week: 1.0 standard drink    Types: 1 Glasses of wine per week    Comment: 1 glass of wine per night  . Drug use: No  . Sexual activity: Not on file  Other Topics Concern  . Not on file  Social History Narrative   Son and daughter      Has living will   Daughter is health care POA   Has DNR ---confirmed and rewritten 02/07/17   No tube    [employer]   Social Determinants of Health   Financial Resource Strain:   . Difficulty of Paying Living Expenses:   Food Insecurity:   . Worried About Programme researcher, broadcasting/film/video in the Last Year:   . Barista in the Last Year:   Transportation Needs:   . Freight forwarder (Medical):   Marland Kitchen Lack of Transportation (Non-Medical):   Physical Activity:   . Days of Exercise per Week:   . Minutes of Exercise  per Session:   Stress:   . Feeling of Stress :   Social Connections:   . Frequency of Communication with Friends and Family:   . Frequency of Social Gatherings with Friends and Family:   . Attends Religious Services:   . Active Member of Clubs or Organizations:   . Attends Banker Meetings:   Marland Kitchen Marital Status:   Intimate Partner Violence:   . Fear of Current or Ex-Partner:   . Emotionally Abused:   Marland Kitchen Physically Abused:   . Sexually Abused:      Constitutional: Denies fever, malaise, fatigue, headache or abrupt weight changes.  HEENT: Denies eye pain, eye redness, ear pain, ringing in the ears, wax buildup, runny nose, nasal congestion, bloody nose, or sore throat. Respiratory: Denies difficulty breathing, shortness of breath, cough or sputum production.   Cardiovascular: Denies chest pain, chest tightness, palpitations or swelling in the hands or feet.  Gastrointestinal: Denies abdominal pain, bloating, constipation, diarrhea or blood in the stool.  GU: Denies urgency, frequency, pain with urination, burning sensation, blood in urine, odor or discharge. Musculoskeletal: Pt reports intermittent joint pain. Denies decrease in range of motion, difficulty with gait, muscle pain or joint swelling.  Skin: Pt reports mycotic toenails. Denies redness, rashes, lesions or ulcercations.  Neurological: Pt reports difficulty with memory. Denies dizziness, difficulty with speech or problems with balance and coordination.  Psych: Pt has a history of anxiety, dysthymia. Denies SI/HI.  No other specific complaints in a complete review of systems (except as listed in HPI above).     Objective:   Physical Exam   BP 139/83   Pulse 97   Temp 97.9 F (36.6 C)   Resp 18   Wt 162 lb 6.4 oz (73.7 kg)   BMI 27.88 kg/m  Wt Readings from Last 3 Encounters:  05/02/20 162 lb 6.4 oz (73.7 kg)  02/04/20 161 lb (73 kg)  12/25/19 158 lb 1.1 oz (71.7 kg)    General: Appears her stated  age, well developed, well nourished in NAD. Skin: Mycotic toenails bilaterally. No ulcerations noted. HEENT: Head: normal shape and size; Eyes: sclera white, no icterus, conjunctiva pink, PERRLA;  Neck:  Neck supple, trachea midline. No masses, lumps or thyromegaly present.  Cardiovascular: Normal rate and rhythm. S1,S2 noted.  No murmur, rubs or gallops noted. No JVD or BLE edema.  Pulmonary/Chest: Normal effort and positive vesicular breath sounds. No respiratory distress.  No wheezes, rales or ronchi noted.  Abdomen: Soft and nontender. Normal bowel sounds. Musculoskeletal:  No difficulty with gait.  Neurological: Alert and oriented. Confused and HOH. Psychiatric: Mood and affect normal. Behavior is normal. Judgment and thought content normal.     BMET    Component Value Date/Time   NA 139 05/28/2018 1116   NA 144 11/10/2011 1018   K 3.4 (L) 05/28/2018 1116   K 4.1 11/10/2011 1018   CL 106 05/28/2018 1116   CL 107 11/10/2011 1018   CO2 24 05/28/2018 1116   CO2 26 11/10/2011 1018   GLUCOSE 91 05/28/2018 1116   GLUCOSE 74 11/10/2011 1018   BUN 15 05/28/2018 1116   BUN 16 11/10/2011 1018   CREATININE 0.60 05/28/2018 1116   CREATININE 0.58 06/09/2012 1346   CALCIUM 8.9 05/28/2018 1116   CALCIUM 8.7 11/10/2011 1018   GFRNONAA >60 05/28/2018 1116   GFRNONAA >60 11/10/2011 1018   GFRAA >60 05/28/2018 1116   GFRAA >60 11/10/2011 1018    Lipid Panel  No results found for: CHOL, TRIG, HDL, CHOLHDL, VLDL, LDLCALC  CBC    Component Value Date/Time   WBC 8.3 05/28/2018 1116   RBC 4.27 05/28/2018 1116   HGB 12.5 05/28/2018 1116   HGB 12.6 11/10/2011 1018   HCT 37.0 05/28/2018 1116   HCT 37.9 11/10/2011 1018   PLT 301 05/28/2018 1116   PLT 271 11/10/2011 1018   MCV 86.8 05/28/2018 1116   MCV 90 11/10/2011 1018   MCH 29.4 05/28/2018 1116   MCHC 33.9 05/28/2018 1116   RDW 14.6 (H) 05/28/2018 1116   RDW 14.7 (H) 11/10/2011 1018   LYMPHSABS 3.1 05/28/2018 1116   MONOABS 0.6  05/28/2018 1116   EOSABS 0.2 05/28/2018 1116   BASOSABS 0.1 05/28/2018 1116    Hgb A1C No results found for: HGBA1C         Assessment & Plan:   Pain due to Mycotic Toenails:  Nails 1 through 5 bilateral feet trimmed by provider using dermal tool.  Patient tolerated well.  No complications.  We will reassess at next routine visit Nicki Reaper, NP This visit occurred during the SARS-CoV-2 public health emergency.  Safety protocols were in place, including screening questions prior to the visit, additional usage of staff PPE, and extensive cleaning of exam room while observing appropriate contact time as indicated for disinfecting solutions.

## 2020-05-02 NOTE — Patient Instructions (Signed)

## 2020-05-02 NOTE — Assessment & Plan Note (Signed)
Continue Escitalopram and Lorazepam, we not indicated We will monitor

## 2020-05-02 NOTE — Assessment & Plan Note (Signed)
Continue aspirin Appreciate ALF care 

## 2020-05-02 NOTE — Assessment & Plan Note (Signed)
Managed on diltiazem and aspirin We will monitor

## 2020-06-10 ENCOUNTER — Other Ambulatory Visit: Payer: Self-pay

## 2020-06-10 ENCOUNTER — Ambulatory Visit (INDEPENDENT_AMBULATORY_CARE_PROVIDER_SITE_OTHER): Payer: Medicare Other | Admitting: Internal Medicine

## 2020-06-10 VITALS — BP 139/83 | HR 74 | Temp 98.2°F | Resp 20

## 2020-06-10 DIAGNOSIS — M79675 Pain in left toe(s): Secondary | ICD-10-CM | POA: Diagnosis not present

## 2020-06-10 DIAGNOSIS — M79674 Pain in right toe(s): Secondary | ICD-10-CM | POA: Diagnosis not present

## 2020-06-10 DIAGNOSIS — B351 Tinea unguium: Secondary | ICD-10-CM | POA: Diagnosis not present

## 2020-06-12 ENCOUNTER — Encounter: Payer: Self-pay | Admitting: Internal Medicine

## 2020-06-12 NOTE — Patient Instructions (Signed)

## 2020-06-12 NOTE — Progress Notes (Signed)
HPI  Asked to see resident in [address], requesting nail trim Nails thick, long getting caught on socks causing pain  Past Medical History:  Diagnosis Date  . Allergic rhinitis   . Depressive disorder, not elsewhere classified   . Hearing loss    bilateral hearing aides  . History of MRI 9/10   Cardiac MRI, normal  . Hyperlipidemia   . Hypertension   . Hypothyroidism   . Insomnia, unspecified   . Osteopenia   . Paroxysmal A-fib (HCC) 2009   [facility]  . Teeth grinding   . Unspecified vitamin D deficiency     Current Outpatient Medications  Medication Sig Dispense Refill  . acetaminophen (TYLENOL) 325 MG tablet Take 650 mg by mouth 2 (two) times daily.    Marland Kitchen acidophilus (RISAQUAD) CAPS capsule Take 1 capsule by mouth daily.    Marland Kitchen albuterol (PROVENTIL HFA;VENTOLIN HFA) 108 (90 Base) MCG/ACT inhaler Inhale 2 puffs into the lungs every 6 (six) hours as needed for wheezing or shortness of breath. (Patient not taking: Reported on 05/02/2020) 1 Inhaler 0  . Ascorbic Acid (VITAMIN C) 1000 MG tablet Take 1,000 mg by mouth daily.    Marland Kitchen aspirin EC 81 MG tablet Take 81 mg by mouth daily.    . Cholecalciferol (VITAMIN D3) 50 MCG (2000 UT) TABS Take 1 tablet by mouth daily.    Marland Kitchen diltiazem (CARDIZEM CD) 120 MG 24 hr capsule Take 1 capsule (120 mg total) by mouth daily. 30 capsule 0  . escitalopram (LEXAPRO) 10 MG tablet Take 10 mg by mouth daily.    Marland Kitchen loratadine (CLARITIN) 10 MG tablet Take 10 mg by mouth daily.    . polyethylene glycol powder (GLYCOLAX/MIRALAX) powder Take 17 g by mouth daily. 3350 g 1  . thyroid (ARMOUR) 90 MG tablet Take 90 mg by mouth daily. At 7am     No current facility-administered medications for this visit.    Allergies  Allergen Reactions  . Ciprofloxacin     Muscle spasm   . Iodinated Diagnostic Agents     Iodine DYe  . Other     Dust Mites    Family History  Problem Relation Age of Onset  . Heart disease Mother   . Pneumonia Father     Social History    Socioeconomic History  . Marital status: Married    Spouse name: Not on file  . Number of children: 2  . Years of education: Not on file  . Highest education level: Not on file  Occupational History  . Occupation: Psychologist, forensic    Comment: Retired  Tobacco Use  . Smoking status: Never Smoker  . Smokeless tobacco: Never Used  Substance and Sexual Activity  . Alcohol use: Yes    Alcohol/week: 1.0 standard drink    Types: 1 Glasses of wine per week    Comment: 1 glass of wine per night  . Drug use: No  . Sexual activity: Not on file  Other Topics Concern  . Not on file  Social History Narrative   Son and daughter      Has living will   Daughter is health care POA   Has DNR ---confirmed and rewritten 02/07/17   No tube    [employer]   Social Determinants of Health   Financial Resource Strain:   . Difficulty of Paying Living Expenses: Not on file  Food Insecurity:   . Worried About Programme researcher, broadcasting/film/video in the Last Year: Not  on file  . Ran Out of Food in the Last Year: Not on file  Transportation Needs:   . Lack of Transportation (Medical): Not on file  . Lack of Transportation (Non-Medical): Not on file  Physical Activity:   . Days of Exercise per Week: Not on file  . Minutes of Exercise per Session: Not on file  Stress:   . Feeling of Stress : Not on file  Social Connections:   . Frequency of Communication with Friends and Family: Not on file  . Frequency of Social Gatherings with Friends and Family: Not on file  . Attends Religious Services: Not on file  . Active Member of Clubs or Organizations: Not on file  . Attends Banker Meetings: Not on file  . Marital Status: Not on file  Intimate Partner Violence:   . Fear of Current or Ex-Partner: Not on file  . Emotionally Abused: Not on file  . Physically Abused: Not on file  . Sexually Abused: Not on file    ROS:  Constitutional: Denies fever, malaise, fatigue, headache or abrupt weight  changes.  Respiratory: Denies difficulty breathing, shortness of breath, cough or sputum production.   Cardiovascular: Denies chest pain, chest tightness, palpitations or swelling in the hands or feet.  Skin: Pt reports mycotic toenails. Denies redness, rashes, lesions or ulcercations.   No other specific complaints in a complete review of systems (except as listed in HPI above).  PE:  BP 139/83   Pulse 74   Temp 98.2 F (36.8 C)   Resp 20   SpO2 98%  Wt Readings from Last 3 Encounters:  05/02/20 73.7 kg  02/04/20 73 kg  12/25/19 71.7 kg    General: Appears her stated age, well developed, well nourished in NAD. Skin: Mycotic toenails bilaterally Neurological: Alert and oriented. Slightly confused.  BMET    Component Value Date/Time   NA 139 05/28/2018 1116   NA 144 11/10/2011 1018   K 3.4 (L) 05/28/2018 1116   K 4.1 11/10/2011 1018   CL 106 05/28/2018 1116   CL 107 11/10/2011 1018   CO2 24 05/28/2018 1116   CO2 26 11/10/2011 1018   GLUCOSE 91 05/28/2018 1116   GLUCOSE 74 11/10/2011 1018   BUN 15 05/28/2018 1116   BUN 16 11/10/2011 1018   CREATININE 0.60 05/28/2018 1116   CREATININE 0.58 06/09/2012 1346   CALCIUM 8.9 05/28/2018 1116   CALCIUM 8.7 11/10/2011 1018   GFRNONAA >60 05/28/2018 1116   GFRNONAA >60 11/10/2011 1018   GFRAA >60 05/28/2018 1116   GFRAA >60 11/10/2011 1018    Lipid Panel  No results found for: CHOL, TRIG, HDL, CHOLHDL, VLDL, LDLCALC  CBC    Component Value Date/Time   WBC 8.3 05/28/2018 1116   RBC 4.27 05/28/2018 1116   HGB 12.5 05/28/2018 1116   HGB 12.6 11/10/2011 1018   HCT 37.0 05/28/2018 1116   HCT 37.9 11/10/2011 1018   PLT 301 05/28/2018 1116   PLT 271 11/10/2011 1018   MCV 86.8 05/28/2018 1116   MCV 90 11/10/2011 1018   MCH 29.4 05/28/2018 1116   MCHC 33.9 05/28/2018 1116   RDW 14.6 (H) 05/28/2018 1116   RDW 14.7 (H) 11/10/2011 1018   LYMPHSABS 3.1 05/28/2018 1116   MONOABS 0.6 05/28/2018 1116   EOSABS 0.2 05/28/2018  1116   BASOSABS 0.1 05/28/2018 1116    Hgb A1C No results found for: HGBA1C   Assessment and Plan:  Pain due to Mycotic Toenails:  Nails 1-5 b/l feet trimmed by provider using dremmel tool. Pt tolerated well, no complications Nicki Reaper, NP

## 2020-08-04 ENCOUNTER — Encounter: Payer: Self-pay | Admitting: Internal Medicine

## 2020-08-04 ENCOUNTER — Ambulatory Visit: Payer: Medicare Other | Admitting: Internal Medicine

## 2020-08-04 DIAGNOSIS — F39 Unspecified mood [affective] disorder: Secondary | ICD-10-CM

## 2020-08-04 DIAGNOSIS — I480 Paroxysmal atrial fibrillation: Secondary | ICD-10-CM

## 2020-08-04 DIAGNOSIS — E039 Hypothyroidism, unspecified: Secondary | ICD-10-CM

## 2020-08-04 DIAGNOSIS — F0150 Vascular dementia without behavioral disturbance: Secondary | ICD-10-CM

## 2020-08-04 NOTE — Assessment & Plan Note (Signed)
She and husband both have cognitive deficits--but they are doing well with the support of staff in [place] No Rx (other than the aspirin)

## 2020-08-04 NOTE — Assessment & Plan Note (Signed)
Seems to be euthyroid Now on levothyroxine

## 2020-08-04 NOTE — Assessment & Plan Note (Signed)
Mild dysthymia at most now Will continue the escitalopram

## 2020-08-04 NOTE — Progress Notes (Signed)
Subjective:    Patient ID: Karen Colon, female    DOB: Jan 23, 1926, 86 y.o.   MRN: [id number]  HPI Visit in [place] apartment for review of chronic health conditions Reviewed status with [name] RN Husband here as well  Doing about the same Still has daily AM aide---hard to motivate to get up, etc  She has no complaints No joint pains No chest pain or SOB No edema No palpitations  Notes depression at times Husband doesn't find her to be depressed Not really anhedonic  Current Outpatient Medications on File Prior to Visit  Medication Sig Dispense Refill  . acetaminophen (TYLENOL) 325 MG tablet Take 650 mg by mouth 2 (two) times daily.    Marland Kitchen acidophilus (RISAQUAD) CAPS capsule Take 1 capsule by mouth daily.    . Ascorbic Acid (VITAMIN C) 1000 MG tablet Take 1,000 mg by mouth daily.    Marland Kitchen aspirin EC 81 MG tablet Take 81 mg by mouth daily.    . Cholecalciferol (VITAMIN D3) 50 MCG (2000 UT) TABS Take 1 tablet by mouth daily.    Marland Kitchen diltiazem (CARDIZEM CD) 120 MG 24 hr capsule Take 1 capsule (120 mg total) by mouth daily. 30 capsule 0  . escitalopram (LEXAPRO) 10 MG tablet Take 10 mg by mouth daily.    Marland Kitchen levothyroxine (SYNTHROID) 75 MCG tablet Take 75 mcg by mouth daily before breakfast.    . loratadine (CLARITIN) 10 MG tablet Take 10 mg by mouth daily.    . polyethylene glycol powder (GLYCOLAX/MIRALAX) powder Take 17 g by mouth daily. 3350 g 1  . traZODone (DESYREL) 50 MG tablet Take 25 mg by mouth at bedtime.     No current facility-administered medications on file prior to visit.    Allergies  Allergen Reactions  . Ciprofloxacin     Muscle spasm   . Iodinated Diagnostic Agents     Iodine DYe  . Other     Dust Mites    Past Medical History:  Diagnosis Date  . Allergic rhinitis   . Depressive disorder, not elsewhere classified   . Hearing loss    bilateral hearing aides  . History of MRI 9/10   Cardiac MRI, normal  . Hyperlipidemia   . Hypertension   . Hypothyroidism    . Insomnia, unspecified   . Osteopenia   . Paroxysmal A-fib (HCC) 2009   [facility]  . Teeth grinding   . Unspecified vitamin D deficiency     Past Surgical History:  Procedure Laterality Date  . APPENDECTOMY  2009  . TONSILLECTOMY      Family History  Problem Relation Age of Onset  . Heart disease Mother   . Pneumonia Father     Social History   Socioeconomic History  . Marital status: Married    Spouse name: Not on file  . Number of children: 2  . Years of education: Not on file  . Highest education level: Not on file  Occupational History  . Occupation: Psychologist, forensic    Comment: Retired  Tobacco Use  . Smoking status: Never Smoker  . Smokeless tobacco: Never Used  Substance and Sexual Activity  . Alcohol use: Yes    Alcohol/week: 1.0 standard drink    Types: 1 Glasses of wine per week    Comment: 1 glass of wine per night  . Drug use: No  . Sexual activity: Not on file  Other Topics Concern  . Not on file  Social History Narrative  Son and daughter      Has living will   Daughter is health care POA   Has DNR ---confirmed and rewritten 02/07/17   No tube    [employer]   Social Determinants of Health   Financial Resource Strain:   . Difficulty of Paying Living Expenses: Not on file  Food Insecurity:   . Worried About Programme researcher, broadcasting/film/video in the Last Year: Not on file  . Ran Out of Food in the Last Year: Not on file  Transportation Needs:   . Lack of Transportation (Medical): Not on file  . Lack of Transportation (Non-Medical): Not on file  Physical Activity:   . Days of Exercise per Week: Not on file  . Minutes of Exercise per Session: Not on file  Stress:   . Feeling of Stress : Not on file  Social Connections:   . Frequency of Communication with Friends and Family: Not on file  . Frequency of Social Gatherings with Friends and Family: Not on file  . Attends Religious Services: Not on file  . Active Member of Clubs or Organizations: Not  on file  . Attends Banker Meetings: Not on file  . Marital Status: Not on file  Intimate Partner Violence:   . Fear of Current or Ex-Partner: Not on file  . Emotionally Abused: Not on file  . Physically Abused: Not on file  . Sexually Abused: Not on file   Review of Systems Sleeps well Appetite is good Weight fairly stable Bowels are moving okay---no longer using the miralax    Objective:   Physical Exam Constitutional:      Appearance: Normal appearance.     Comments: Very HOH  Cardiovascular:     Pulses: Normal pulses.     Heart sounds: Normal heart sounds. No murmur heard.  No gallop.   Pulmonary:     Effort: Pulmonary effort is normal.     Comments: Slight expiratory rhonchi but not tight Abdominal:     Palpations: Abdomen is soft.     Tenderness: There is no abdominal tenderness.  Musculoskeletal:     Cervical back: Neck supple.     Right lower leg: No edema.     Left lower leg: No edema.  Lymphadenopathy:     Cervical: No cervical adenopathy.  Skin:    Findings: No rash.     Comments: Benign lesion right forehead---can freeze it if it grows  Neurological:     Mental Status: She is alert.     Comments: Repeats herself frequently  Psychiatric:        Mood and Affect: Mood normal.        Behavior: Behavior normal.            Assessment & Plan:

## 2020-08-04 NOTE — Assessment & Plan Note (Signed)
Seems to be low burden on the diltiazem ASA only

## 2020-10-25 IMAGING — CT CT HEAD W/O CM
3 series · 15 of 47 positions shown, 18 images · non-contrast
Comparison: 05/28/2018

CLINICAL DATA: Unwitnessed fall, head trauma. Dementia.

EXAM:
CT HEAD WITHOUT CONTRAST
TECHNIQUE: Contiguous axial images were obtained from the base of the skull
through the vertex without intravenous contrast.

[Series 3: head wo · axial · 0.47mm/px · z∈[+387,+512]mm · 9 of 30 slices shown, 12 images]
[im 3/30  brain]
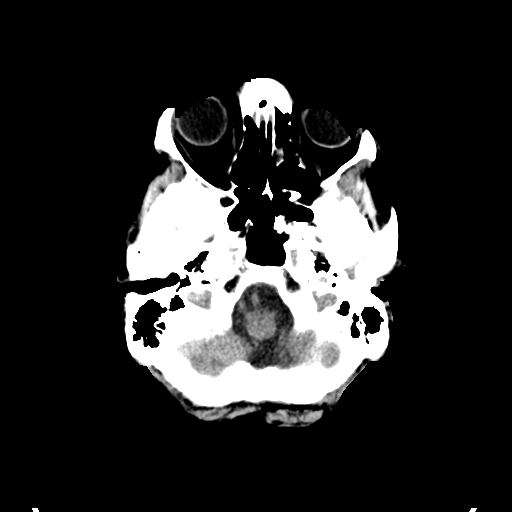
[im 3/30  bone]
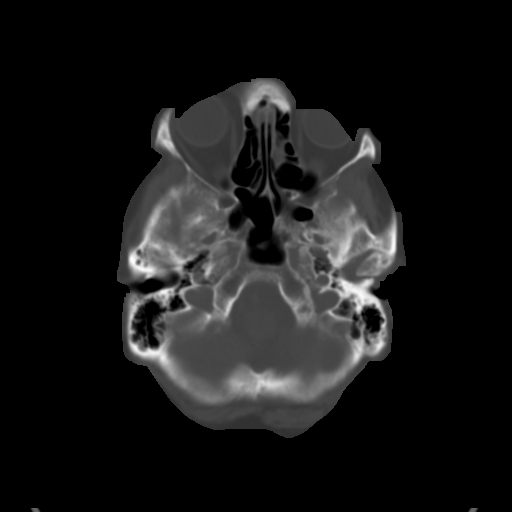
[im 6/30  brain]
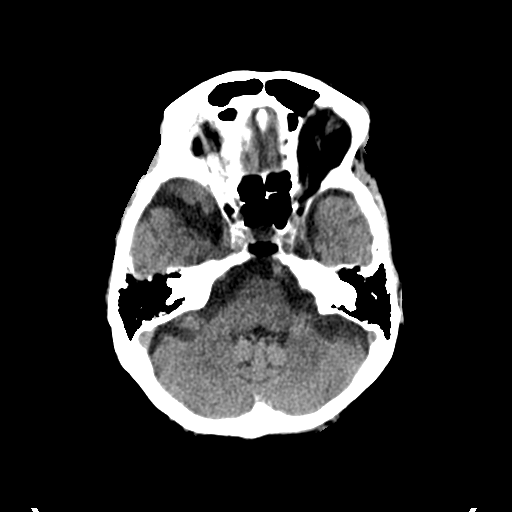
[im 9/30  brain]
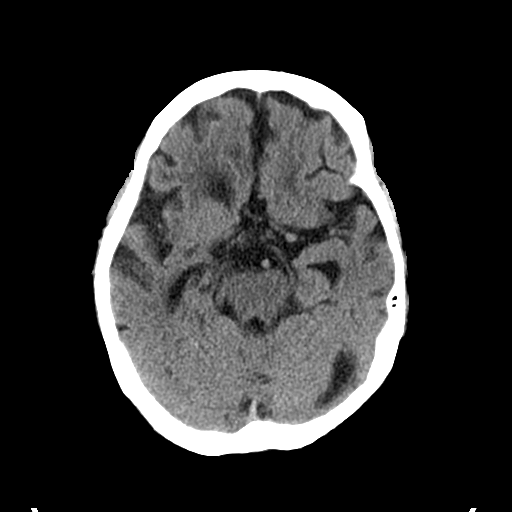
[im 12/30  brain]
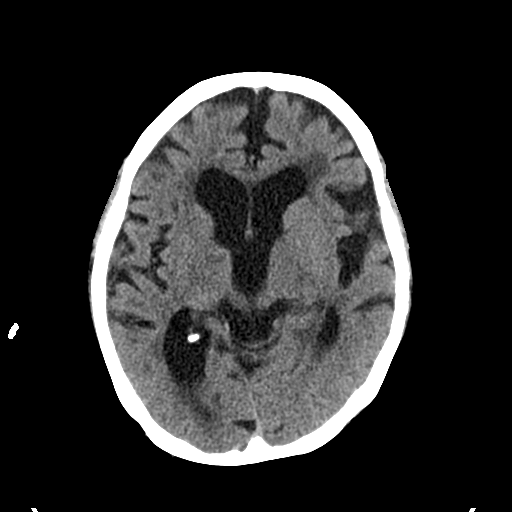
[im 16/30  brain]
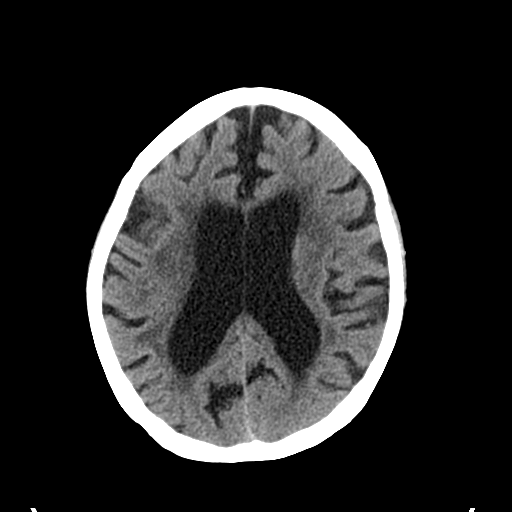
[im 16/30  bone]
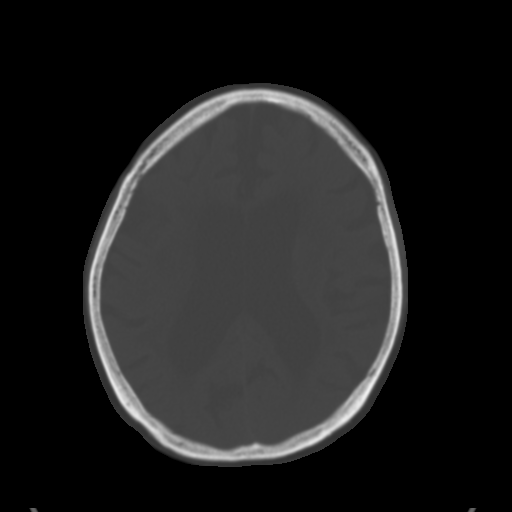
[im 19/30  brain]
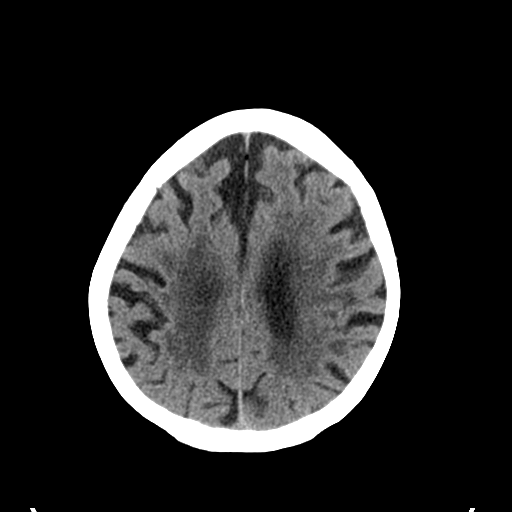
[im 22/30  brain]
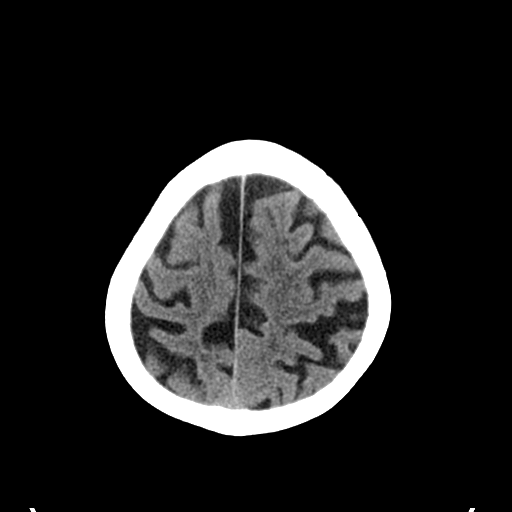
[im 25/30  brain]
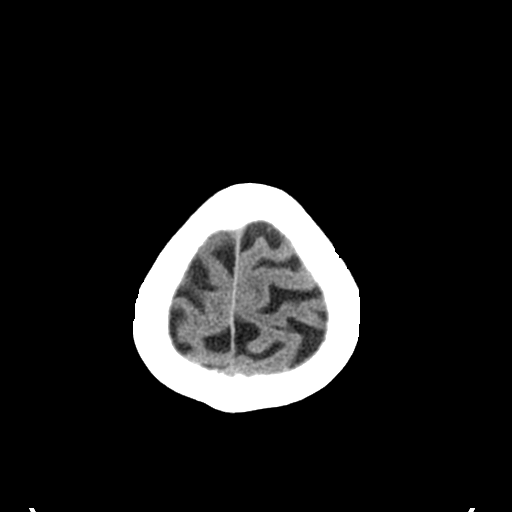
[im 28/30  brain]
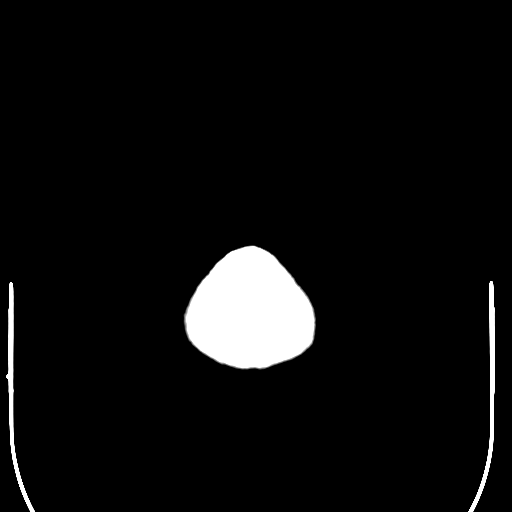
[im 28/30  bone]
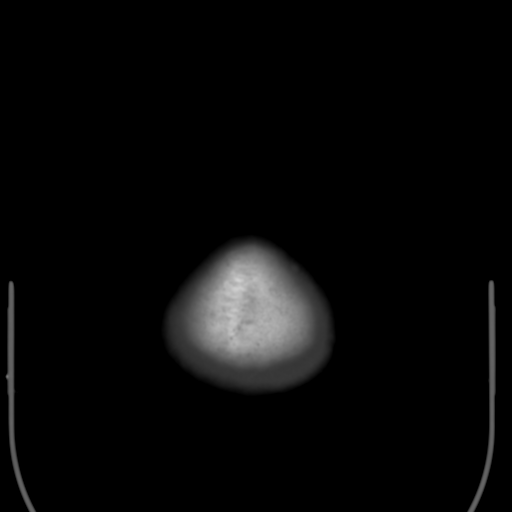

[Series 4: coronal soft tissue · coronal · 0.29mm/px · 3 of 65 slices shown]
[im 22/65  brain]
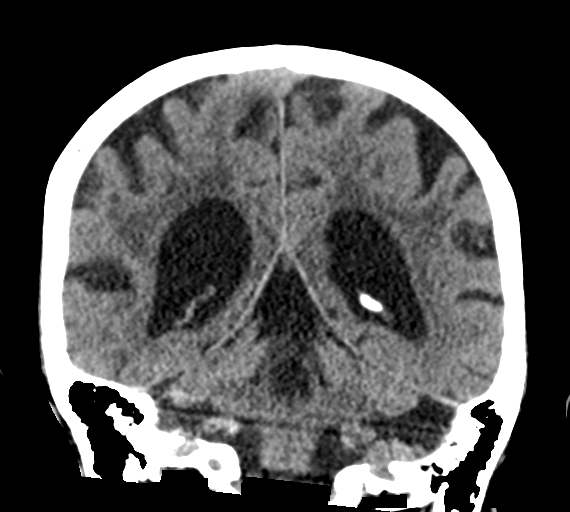
[im 29/65  brain]
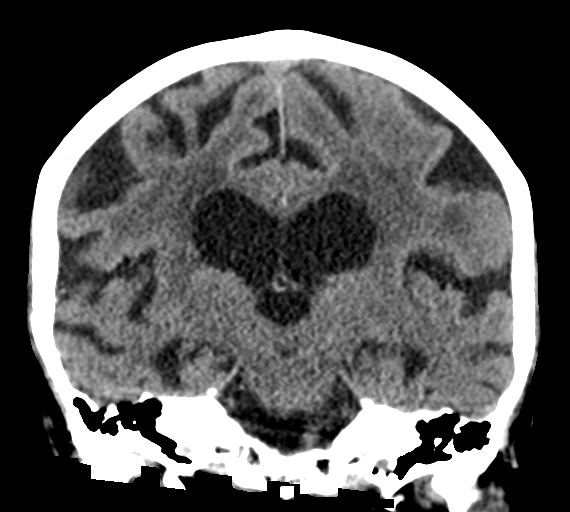
[im 36/65  brain]
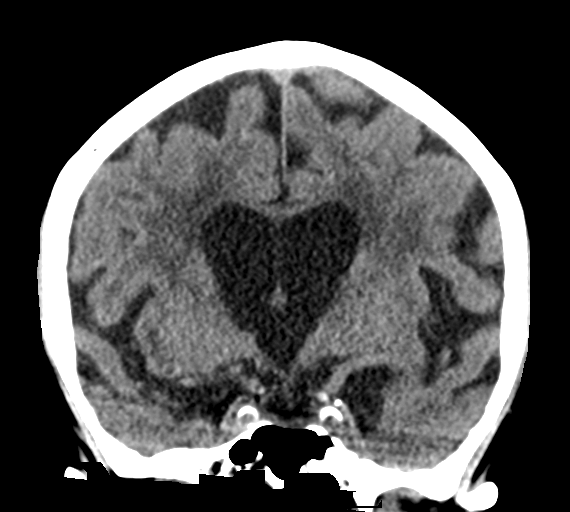

[Series 5: sagittal soft tissue · sagittal · 0.29mm/px · 3 of 57 slices shown]
[im 19/57  brain]
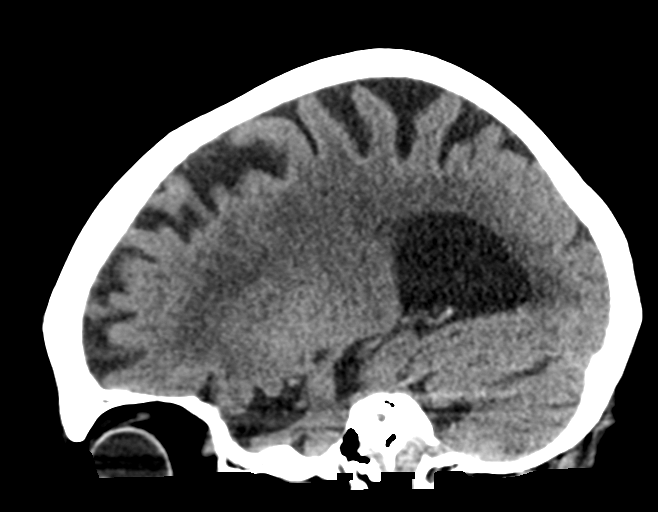
[im 29/57  brain]
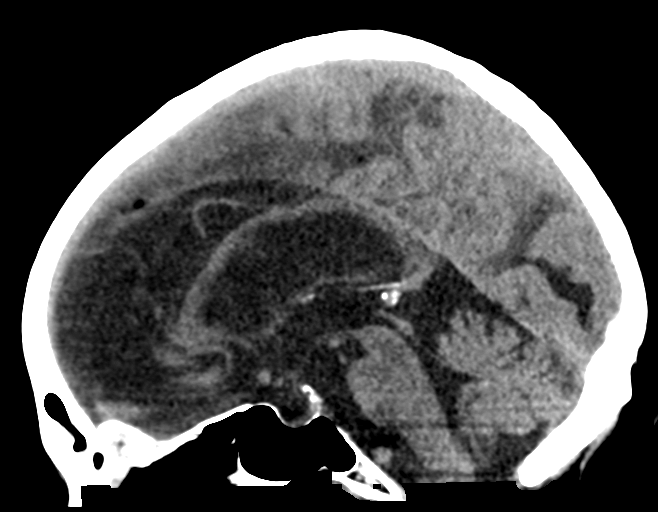
[im 38/57  brain]
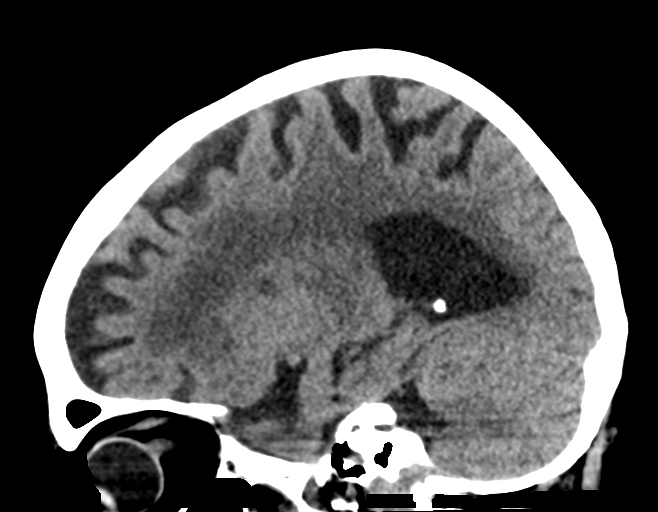

[15 of 47 positions shown; findings below may reference images not displayed]

FINDINGS: Brain: The brainstem, cerebellum, cerebral peduncles, thalami, basal
ganglia, basilar cisterns, and ventricular system appear within
normal limits. Periventricular white matter and corona radiata
hypodensities favor chronic ischemic microvascular white matter
disease. No intracranial hemorrhage, mass lesion, or acute CVA.

Vascular: There is atherosclerotic calcification of the cavernous
carotid arteries bilaterally.

Skull: Unremarkable

Sinuses/Orbits: Chronic ethmoid sinusitis.

Other: No supplemental non-categorized findings.
IMPRESSION: 1. No acute intracranial findings.
2. Periventricular white matter and corona radiata hypodensities
favor chronic ischemic microvascular white matter disease.
3. Chronic ethmoid sinusitis.

## 2020-10-26 ENCOUNTER — Ambulatory Visit: Payer: Medicare Other | Admitting: Internal Medicine

## 2020-10-26 ENCOUNTER — Other Ambulatory Visit: Payer: Self-pay

## 2020-10-26 VITALS — BP 148/94 | HR 97 | Temp 98.0°F | Resp 18 | Wt 164.8 lb

## 2020-10-26 DIAGNOSIS — I480 Paroxysmal atrial fibrillation: Secondary | ICD-10-CM

## 2020-10-26 DIAGNOSIS — E039 Hypothyroidism, unspecified: Secondary | ICD-10-CM | POA: Diagnosis not present

## 2020-10-26 DIAGNOSIS — R059 Cough, unspecified: Secondary | ICD-10-CM

## 2020-10-26 DIAGNOSIS — F0150 Vascular dementia without behavioral disturbance: Secondary | ICD-10-CM | POA: Diagnosis not present

## 2020-10-26 DIAGNOSIS — F5104 Psychophysiologic insomnia: Secondary | ICD-10-CM

## 2020-10-26 DIAGNOSIS — F39 Unspecified mood [affective] disorder: Secondary | ICD-10-CM

## 2020-10-27 ENCOUNTER — Telehealth: Payer: Self-pay

## 2020-10-27 NOTE — Telephone Encounter (Signed)
Ann pts daughter and POA left v/m requesting cb from Dr Alphonsus Sias; I spoke with Dewayne Hatch and she said initially she called because nurse at [facility] was concerned about possible bowel obstruction due to explosive diarrhea and pt vomited x 1 this morning. Pt also holds her chest when she eats like she is in pain. Ann said since she left the initial v/m she got notification from [facility] that her mother and father (will send separate note to Dr Alphonsus Sias for pts husband) has been dx this afternoon with + covid. Dewayne Hatch said [facility] wanted to know if pt would need antigen for + covid due to pts health and age. Pamala Hurry NP saw pt on 10/26/20 at nursing home. Ann said would have to ck with nursing staff at [facility] to see if other covid symptoms. Dewayne Hatch also said that [facility] was to email Dr Alphonsus Sias about antigen. Ann request cb from Dr Alphonsus Sias. Sending note to Dr Alphonsus Sias and Carollee Herter CMA.

## 2020-10-27 NOTE — Telephone Encounter (Signed)
Spoke to daughter Vomiting probably from the COVID Was boosted and not having really bad time--so not likely a monoclonal antibody candidate Will isolate in her AL room ----will be moving to SNF (already planned) after isolation

## 2020-11-03 ENCOUNTER — Telehealth: Payer: Self-pay | Admitting: Cardiovascular Disease

## 2020-11-03 ENCOUNTER — Encounter: Payer: Self-pay | Admitting: Internal Medicine

## 2020-11-03 DIAGNOSIS — G4700 Insomnia, unspecified: Secondary | ICD-10-CM | POA: Insufficient documentation

## 2020-11-03 NOTE — Assessment & Plan Note (Signed)
FL to sign to transition to health care to be with her husband Continue Aspirin Appreciate ALF care

## 2020-11-03 NOTE — Telephone Encounter (Signed)
3 attempts to schedule fu appt from recall list.   Deleting recall.   

## 2020-11-03 NOTE — Assessment & Plan Note (Signed)
Continue Diltiazem and Aspirin We will monitor

## 2020-11-03 NOTE — Assessment & Plan Note (Signed)
Continue Escitalopram, we not indicated

## 2020-11-03 NOTE — Assessment & Plan Note (Signed)
Continue Levothyroxine °Monitor free T4 yearly °

## 2020-11-03 NOTE — Progress Notes (Signed)
Subjective:    Patient ID: Karen Colon, female    DOB: Dec 13, 1925, 87 y.o.   MRN: [id number]  HPI  Resident seen in [address] for routine follow-up. RN reports resident has had a cough for the last few days.  The cough is moist but nonproductive.  Resident reports she is feeling much better today. She denies concerns.  She reports she is sleeping well.  She walks without a device.  She is independent with ADLs.  Her appetite is good. Weight is stable.  She denies any issues with bowel or bladder at this time.  She denies pain, reflux or shortness of breath.  A. fib: Managed on Diltiazem and Aspirin.  She does not follow with cardiology.  Mood Disorder: Chronic dysthymia.  Managed on Escitalopram.  Hypothyroidism: She denies any issues on her current dose of Levothyroxine.  Insomnia: She reports she sleeps well with the use of Trazodone.  Vascular Dementia: Mild cognitive and functional needs.  Managed on ASA.  Review of Systems      Past Medical History:  Diagnosis Date  . Allergic rhinitis   . Depressive disorder, not elsewhere classified   . Hearing loss    bilateral hearing aides  . History of MRI 9/10   Cardiac MRI, normal  . Hyperlipidemia   . Hypertension   . Hypothyroidism   . Insomnia, unspecified   . Osteopenia   . Paroxysmal A-fib (HCC) 2009   [facility]  . Teeth grinding   . Unspecified vitamin D deficiency     Current Outpatient Medications  Medication Sig Dispense Refill  . acetaminophen (TYLENOL) 325 MG tablet Take 650 mg by mouth 2 (two) times daily.    Marland Kitchen acidophilus (RISAQUAD) CAPS capsule Take 1 capsule by mouth daily.    . Ascorbic Acid (VITAMIN C) 1000 MG tablet Take 1,000 mg by mouth daily.    Marland Kitchen aspirin EC 81 MG tablet Take 81 mg by mouth daily.    . Cholecalciferol (VITAMIN D3) 50 MCG (2000 UT) TABS Take 1 tablet by mouth daily.    Marland Kitchen diltiazem (CARDIZEM CD) 120 MG 24 hr capsule Take 1 capsule (120 mg total) by mouth daily. 30 capsule 0  .  escitalopram (LEXAPRO) 10 MG tablet Take 10 mg by mouth daily.    Marland Kitchen levothyroxine (SYNTHROID) 75 MCG tablet Take 75 mcg by mouth daily before breakfast.    . loratadine (CLARITIN) 10 MG tablet Take 10 mg by mouth daily.    . traZODone (DESYREL) 50 MG tablet Take 25 mg by mouth at bedtime.     No current facility-administered medications for this visit.    Allergies  Allergen Reactions  . Ciprofloxacin     Muscle spasm   . Iodinated Diagnostic Agents     Iodine DYe  . Other     Dust Mites    Family History  Problem Relation Age of Onset  . Heart disease Mother   . Pneumonia Father     Social History   Socioeconomic History  . Marital status: Married    Spouse name: Not on file  . Number of children: 2  . Years of education: Not on file  . Highest education level: Not on file  Occupational History  . Occupation: Psychologist, forensic    Comment: Retired  Tobacco Use  . Smoking status: Never Smoker  . Smokeless tobacco: Never Used  Substance and Sexual Activity  . Alcohol use: Yes    Alcohol/week: 1.0 standard drink  Types: 1 Glasses of wine per week    Comment: 1 glass of wine per night  . Drug use: No  . Sexual activity: Not on file  Other Topics Concern  . Not on file  Social History Narrative   Son and daughter      Has living will   Daughter is health care POA   Has DNR ---confirmed and rewritten 02/07/17   No tube    [employer]   Social Determinants of Health   Financial Resource Strain: Not on file  Food Insecurity: Not on file  Transportation Needs: Not on file  Physical Activity: Not on file  Stress: Not on file  Social Connections: Not on file  Intimate Partner Violence: Not on file     Constitutional: Denies fever, malaise, fatigue, headache or abrupt weight changes.  HEENT: Denies eye pain, eye redness, ear pain, ringing in the ears, wax buildup, runny nose, nasal congestion, bloody nose, or sore throat. Respiratory: Patient reports  cough.  Denies difficulty breathing, shortness of breath, or sputum production.   Cardiovascular: Denies chest pain, chest tightness, palpitations or swelling in the hands or feet.  Gastrointestinal: Denies abdominal pain, bloating, constipation, diarrhea or blood in the stool.  GU: Denies urgency, frequency, pain with urination, burning sensation, blood in urine, odor or discharge. Musculoskeletal: Denies decrease in range of motion, difficulty with gait, muscle pain or joint pain and swelling.  Skin: Denies redness, rashes, lesions or ulcercations.  Neurological: Patient has a history of insomnia.  Denies dizziness, difficulty with memory, difficulty with speech or problems with balance and coordination.  Psych: Patient has a history of depression.  Denies anxiety, SI/HI.  No other specific complaints in a complete review of systems (except as listed in HPI above).  Objective:   Physical Exam   BP (!) 148/94   Pulse 97   Temp 98 F (36.7 C)   Resp 18   Wt 164 lb 12.8 oz (74.8 kg)   BMI 28.29 kg/m  Wt Readings from Last 3 Encounters:  11/03/20 164 lb 12.8 oz (74.8 kg)  08/04/20 162 lb 8 oz (73.7 kg)  05/02/20 162 lb 6.4 oz (73.7 kg)    General: Appears her stated age, well developed, well nourished in NAD. Neck:  Neck supple, trachea midline. No masses, lumps or thyromegaly present.  Cardiovascular: Normal rate and rhythm. S1,S2 noted.  No murmur, rubs or gallops noted. No JVD or BLE edema.  Pulmonary/Chest: Normal effort and positive vesicular breath sounds. No respiratory distress. No wheezes, rales or ronchi noted.  Musculoskeletal:  No difficulty with gait.  Neurological: Alert, slightly confused. HOH. Psych: Mood and affect mildly flat.  BMET    Component Value Date/Time   NA 139 05/28/2018 1116   NA 144 11/10/2011 1018   K 3.4 (L) 05/28/2018 1116   K 4.1 11/10/2011 1018   CL 106 05/28/2018 1116   CL 107 11/10/2011 1018   CO2 24 05/28/2018 1116   CO2 26 11/10/2011  1018   GLUCOSE 91 05/28/2018 1116   GLUCOSE 74 11/10/2011 1018   BUN 15 05/28/2018 1116   BUN 16 11/10/2011 1018   CREATININE 0.60 05/28/2018 1116   CREATININE 0.58 06/09/2012 1346   CALCIUM 8.9 05/28/2018 1116   CALCIUM 8.7 11/10/2011 1018   GFRNONAA >60 05/28/2018 1116   GFRNONAA >60 11/10/2011 1018   GFRAA >60 05/28/2018 1116   GFRAA >60 11/10/2011 1018    Lipid Panel  No results found for: CHOL, TRIG,  HDL, CHOLHDL, VLDL, LDLCALC  CBC    Component Value Date/Time   WBC 8.3 05/28/2018 1116   RBC 4.27 05/28/2018 1116   HGB 12.5 05/28/2018 1116   HGB 12.6 11/10/2011 1018   HCT 37.0 05/28/2018 1116   HCT 37.9 11/10/2011 1018   PLT 301 05/28/2018 1116   PLT 271 11/10/2011 1018   MCV 86.8 05/28/2018 1116   MCV 90 11/10/2011 1018   MCH 29.4 05/28/2018 1116   MCHC 33.9 05/28/2018 1116   RDW 14.6 (H) 05/28/2018 1116   RDW 14.7 (H) 11/10/2011 1018   LYMPHSABS 3.1 05/28/2018 1116   MONOABS 0.6 05/28/2018 1116   EOSABS 0.2 05/28/2018 1116   BASOSABS 0.1 05/28/2018 1116    Hgb A1C No results found for: HGBA1C         Assessment & Plan:   Cough:  Rapid Covid test negative Clinically improved Robitussin-DM as needed for cough  We will reassess as needed or if symptoms persist or worsen Nicki Reaper, NP This visit occurred during the SARS-CoV-2 public health emergency.  Safety protocols were in place, including screening questions prior to the visit, additional usage of staff PPE, and extensive cleaning of exam room while observing appropriate contact time as indicated for disinfecting solutions.

## 2020-11-03 NOTE — Assessment & Plan Note (Signed)
Continue Trazodone °We will monitor °

## 2020-11-03 NOTE — Patient Instructions (Signed)

## 2020-11-15 DIAGNOSIS — E039 Hypothyroidism, unspecified: Secondary | ICD-10-CM

## 2020-11-15 DIAGNOSIS — I480 Paroxysmal atrial fibrillation: Secondary | ICD-10-CM

## 2020-11-15 DIAGNOSIS — F0150 Vascular dementia without behavioral disturbance: Secondary | ICD-10-CM

## 2020-11-15 DIAGNOSIS — F39 Unspecified mood [affective] disorder: Secondary | ICD-10-CM

## 2020-12-16 DIAGNOSIS — B351 Tinea unguium: Secondary | ICD-10-CM | POA: Diagnosis not present

## 2020-12-21 DIAGNOSIS — B351 Tinea unguium: Secondary | ICD-10-CM | POA: Diagnosis not present

## 2020-12-22 DIAGNOSIS — I480 Paroxysmal atrial fibrillation: Secondary | ICD-10-CM | POA: Diagnosis not present

## 2020-12-22 DIAGNOSIS — M159 Polyosteoarthritis, unspecified: Secondary | ICD-10-CM | POA: Diagnosis not present

## 2020-12-22 DIAGNOSIS — F0150 Vascular dementia without behavioral disturbance: Secondary | ICD-10-CM | POA: Diagnosis not present

## 2020-12-22 DIAGNOSIS — F39 Unspecified mood [affective] disorder: Secondary | ICD-10-CM | POA: Diagnosis not present

## 2021-01-18 DIAGNOSIS — F0150 Vascular dementia without behavioral disturbance: Secondary | ICD-10-CM | POA: Diagnosis not present

## 2021-01-18 DIAGNOSIS — E039 Hypothyroidism, unspecified: Secondary | ICD-10-CM | POA: Diagnosis not present

## 2021-01-18 DIAGNOSIS — I4891 Unspecified atrial fibrillation: Secondary | ICD-10-CM | POA: Diagnosis not present

## 2021-01-18 DIAGNOSIS — M1990 Unspecified osteoarthritis, unspecified site: Secondary | ICD-10-CM

## 2021-01-18 DIAGNOSIS — F39 Unspecified mood [affective] disorder: Secondary | ICD-10-CM | POA: Diagnosis not present

## 2021-02-13 DIAGNOSIS — M159 Polyosteoarthritis, unspecified: Secondary | ICD-10-CM | POA: Diagnosis not present

## 2021-02-13 DIAGNOSIS — F0150 Vascular dementia without behavioral disturbance: Secondary | ICD-10-CM | POA: Diagnosis not present

## 2021-02-13 DIAGNOSIS — F39 Unspecified mood [affective] disorder: Secondary | ICD-10-CM | POA: Diagnosis not present

## 2021-02-13 DIAGNOSIS — I480 Paroxysmal atrial fibrillation: Secondary | ICD-10-CM | POA: Diagnosis not present

## 2021-04-07 DIAGNOSIS — I4891 Unspecified atrial fibrillation: Secondary | ICD-10-CM | POA: Diagnosis not present

## 2021-04-07 DIAGNOSIS — F39 Unspecified mood [affective] disorder: Secondary | ICD-10-CM | POA: Diagnosis not present

## 2021-04-07 DIAGNOSIS — E039 Hypothyroidism, unspecified: Secondary | ICD-10-CM | POA: Diagnosis not present

## 2021-04-07 DIAGNOSIS — F0150 Vascular dementia without behavioral disturbance: Secondary | ICD-10-CM | POA: Diagnosis not present

## 2021-04-07 DIAGNOSIS — M1990 Unspecified osteoarthritis, unspecified site: Secondary | ICD-10-CM

## 2021-04-17 DIAGNOSIS — F32A Depression, unspecified: Secondary | ICD-10-CM | POA: Diagnosis not present

## 2021-04-17 DIAGNOSIS — K589 Irritable bowel syndrome without diarrhea: Secondary | ICD-10-CM | POA: Diagnosis not present

## 2021-04-17 DIAGNOSIS — Z20822 Contact with and (suspected) exposure to covid-19: Secondary | ICD-10-CM | POA: Diagnosis not present

## 2021-04-17 DIAGNOSIS — S3992XA Unspecified injury of lower back, initial encounter: Secondary | ICD-10-CM | POA: Diagnosis not present

## 2021-04-17 DIAGNOSIS — E039 Hypothyroidism, unspecified: Secondary | ICD-10-CM | POA: Diagnosis not present

## 2021-04-17 DIAGNOSIS — I480 Paroxysmal atrial fibrillation: Secondary | ICD-10-CM | POA: Diagnosis not present

## 2021-04-17 DIAGNOSIS — S0003XA Contusion of scalp, initial encounter: Secondary | ICD-10-CM | POA: Diagnosis not present

## 2021-04-17 DIAGNOSIS — S299XXA Unspecified injury of thorax, initial encounter: Secondary | ICD-10-CM | POA: Diagnosis not present

## 2021-04-17 DIAGNOSIS — M5134 Other intervertebral disc degeneration, thoracic region: Secondary | ICD-10-CM | POA: Diagnosis not present

## 2021-04-17 DIAGNOSIS — M79621 Pain in right upper arm: Secondary | ICD-10-CM | POA: Diagnosis not present

## 2021-04-17 DIAGNOSIS — Z79890 Hormone replacement therapy (postmenopausal): Secondary | ICD-10-CM | POA: Diagnosis not present

## 2021-04-17 DIAGNOSIS — S199XXA Unspecified injury of neck, initial encounter: Secondary | ICD-10-CM | POA: Diagnosis not present

## 2021-04-17 DIAGNOSIS — Z79899 Other long term (current) drug therapy: Secondary | ICD-10-CM | POA: Diagnosis not present

## 2021-04-17 DIAGNOSIS — G309 Alzheimer's disease, unspecified: Secondary | ICD-10-CM | POA: Diagnosis not present

## 2021-04-17 DIAGNOSIS — Q782 Osteopetrosis: Secondary | ICD-10-CM | POA: Diagnosis not present

## 2021-04-17 DIAGNOSIS — S2231XA Fracture of one rib, right side, initial encounter for closed fracture: Secondary | ICD-10-CM | POA: Diagnosis not present

## 2021-04-17 DIAGNOSIS — Z043 Encounter for examination and observation following other accident: Secondary | ICD-10-CM | POA: Diagnosis not present

## 2021-04-17 DIAGNOSIS — M25551 Pain in right hip: Secondary | ICD-10-CM | POA: Diagnosis not present

## 2021-04-17 DIAGNOSIS — Z66 Do not resuscitate: Secondary | ICD-10-CM | POA: Diagnosis not present

## 2021-04-17 DIAGNOSIS — M25511 Pain in right shoulder: Secondary | ICD-10-CM | POA: Diagnosis not present

## 2021-04-17 DIAGNOSIS — M79604 Pain in right leg: Secondary | ICD-10-CM | POA: Diagnosis not present

## 2021-04-17 DIAGNOSIS — S0101XA Laceration without foreign body of scalp, initial encounter: Secondary | ICD-10-CM | POA: Diagnosis not present

## 2021-04-18 DIAGNOSIS — S2239XA Fracture of one rib, unspecified side, initial encounter for closed fracture: Secondary | ICD-10-CM | POA: Insufficient documentation

## 2021-04-18 DIAGNOSIS — Y92129 Unspecified place in nursing home as the place of occurrence of the external cause: Secondary | ICD-10-CM

## 2021-04-18 DIAGNOSIS — S060XAA Concussion with loss of consciousness status unknown, initial encounter: Secondary | ICD-10-CM | POA: Insufficient documentation

## 2021-04-18 DIAGNOSIS — S2231XA Fracture of one rib, right side, initial encounter for closed fracture: Secondary | ICD-10-CM

## 2021-04-18 DIAGNOSIS — S060X0A Concussion without loss of consciousness, initial encounter: Secondary | ICD-10-CM | POA: Diagnosis not present

## 2021-04-18 DIAGNOSIS — I480 Paroxysmal atrial fibrillation: Secondary | ICD-10-CM | POA: Diagnosis not present

## 2021-04-18 DIAGNOSIS — W19XXXA Unspecified fall, initial encounter: Secondary | ICD-10-CM | POA: Insufficient documentation

## 2021-04-18 DIAGNOSIS — S01411A Laceration without foreign body of right cheek and temporomandibular area, initial encounter: Secondary | ICD-10-CM

## 2021-04-19 DIAGNOSIS — S2231XA Fracture of one rib, right side, initial encounter for closed fracture: Secondary | ICD-10-CM | POA: Diagnosis not present

## 2021-04-27 DIAGNOSIS — S2231XA Fracture of one rib, right side, initial encounter for closed fracture: Secondary | ICD-10-CM | POA: Diagnosis not present

## 2021-05-03 ENCOUNTER — Other Ambulatory Visit: Payer: Self-pay

## 2021-05-03 ENCOUNTER — Non-Acute Institutional Stay: Payer: Self-pay | Admitting: Student

## 2021-05-03 DIAGNOSIS — Z515 Encounter for palliative care: Secondary | ICD-10-CM

## 2021-05-03 DIAGNOSIS — F0150 Vascular dementia without behavioral disturbance: Secondary | ICD-10-CM

## 2021-05-03 DIAGNOSIS — R52 Pain, unspecified: Secondary | ICD-10-CM

## 2021-05-03 NOTE — Progress Notes (Signed)
[facility] Consult Note Telephone: [phone]  Fax: [phone]   Date of encounter: 05/03/21 PATIENT NAME: Karen Colon [address] [place] [address]   [phone] (home)  DOB: [birth year] MRN: [id number] PRIMARY CARE PROVIDER:    Venia Carbon, MD,  [place] [address] [phone]  REFERRING PROVIDER:   Venia Carbon, MD [address] [place],  [place] [address] [phone]  RESPONSIBLE PARTY:    Contact Information     Name Relation Home Work [place]   Joice, Nazario Daughter   [phone]        I met face to face with patient in the facility. Daughter Lelon Frohlich arrived at facility; introduced palliative medicine. Palliative Care was asked to follow this patient by consultation request of  Venia Carbon, MD to address advance care planning and complex medical decision making. This is the initial visit.                                     ASSESSMENT AND PLAN / RECOMMENDATIONS:   Advance Care Planning/Goals of Care: Goals include to maximize quality of life and symptom management. Patient/health care surrogate gave his/her permission to discuss.Our advance care planning conversation included a discussion about:    The value and importance of advance care planning  Experiences with loved ones who have been seriously ill or have died  Exploration of personal, cultural or spiritual beliefs that might influence medical decisions  Exploration of goals of care in the event of a sudden injury or illness  Review and updating or creation of an  advance directive document  Decision not to resuscitate or to de-escalate disease focused treatments due to poor prognosis. CODE STATUS: DNR, DNH per facility.  Symptom Management/Plan:  Pain-secondary to right rib fracture, OA. Rib pain improving. Continue prn oxycodone, lidocaine 5% patch to right side, right upper back as directed, diclofenac gel 1%  prn to bilateral knees every prn as directed.   Dementia-staff to reorient/redirect as needed. Monitor for falls/safety. Monitor for cognitive and functional declines.   Follow up Palliative Care Visit: Palliative care will continue to follow for complex medical decision making, advance care planning, and clarification of goals. Return in 4-6 weeks or prn.  I spent 35 minutes providing this consultation. More than 50% of the time in this consultation was spent in counseling and care coordination.   PPS: 40%  HOSPICE ELIGIBILITY/DIAGNOSIS: TBD  Chief Complaint: Palliative Medicine initial consult  HISTORY OF PRESENT ILLNESS:  Karen Colon is a 87 y.o. year old female  with Vascular dementia, hypothyroidism, osteoporosis, depression, atrial fibrillation, allergic rhinitis,  hx of pelvic fracture. Hospital on 7/25-7/26 s/p fall with subsequent rib fracture.  Patient resides at [facility]. She is status post fall with right rib facture. Staff reported decline after fall, but now she is improving and back to her baseline. Patient reports doing well. She denies pain; staff states her pain has improved. She does get up unassisted, will transfer to w/c and takes herself to the bathroom. She endorses a good appetite. No shortness of breath, nausea, constipation. She is not receiving any therapy at this time. She states she is sleeping well.   History obtained from review of EMR, discussion with primary team, and interview with family, facility staff/caregiver and/or Ms. Neisler.  I reviewed available labs, medications, imaging, studies  and related documents from the EMR.  Records reviewed and summarized above.   ROS  General: NAD EYES: denies vision changes ENMT: denies dysphagia Cardiovascular: denies chest pain, denies DOE Pulmonary: denies cough, denies increased SOB Abdomen: endorses good appetite, denies constipation GU: denies dysuria, endorses continence of urine MSK:  weakness Skin:  denies rashes or wounds Neurological: denies pain, denies insomnia Psych: Endorses stable mood Heme/lymph/immuno: denies bruises, abnormal bleeding  Physical Exam: Pulse 96, resp 16, b/p 130/80, sats 99% on room air Constitutional: NAD General: frail appearing, thin EYES: anicteric sclera, lids intact, no discharge  ENMT: intact hearing, oral mucous membranes moist, dentition intact CV: S1S2, RRR, no LE edema Pulmonary: LCTA, no increased work of breathing, no cough, room air Abdomen: normo-active BS + 4 quadrants, soft and non tender GU: deferred MSK: moves all extremities, w/c for locomotion Skin: warm and dry, no rashes or wounds on visible skin Neuro: generalized weakness, A & O to person, place, forgetful Psych: non-anxious affect, pleasant Hem/lymph/immuno: no widespread bruising  CURRENT PROBLEM LIST:  Patient Active Problem List   Diagnosis Date Noted   Insomnia 11/03/2020   Mood disorder ([place]) 02/07/2017   Vascular dementia without behavioral disturbance ([place]) 06/25/2012   Hypothyroidism 06/09/2012   Allergic rhinitis 06/09/2012   Atrial fibrillation ([place]) 06/12/2011   PAST MEDICAL HISTORY:  Active Ambulatory Problems    Diagnosis Date Noted   Atrial fibrillation ([place]) 06/12/2011   Hypothyroidism 06/09/2012   Allergic rhinitis 06/09/2012   Vascular dementia without behavioral disturbance ([place]) 06/25/2012   Mood disorder ([place]) 02/07/2017   Insomnia 11/03/2020   Resolved Ambulatory Problems    Diagnosis Date Noted   HYPERTENSION, BENIGN 03/14/2009   Palpitations 03/14/2009   Chest pain 03/10/2010   Fatigue 06/25/2012   Diarrhea 04/10/2013   Lower abdominal pain 08/22/2017   Atrial fibrillation with RVR (HCC) 11/15/2017   Abdominal pain 02/20/2018   Chronic constipation 08/14/2018   OA (osteoarthritis) 12/05/2018   Sleep disturbance 12/05/2018   Past Medical History:  Diagnosis Date   Allergic rhinitis    Depressive disorder, not elsewhere classified     Hearing loss    History of MRI 9/10   Hyperlipidemia    Hypertension    Insomnia, unspecified    Osteopenia    Paroxysmal A-fib ([place]) 2009   Teeth grinding    Unspecified vitamin D deficiency    SOCIAL HX:  Social History   Tobacco Use   Smoking status: Never   Smokeless tobacco: Never  Substance Use Topics   Alcohol use: Yes    Alcohol/week: 1.0 standard drink    Types: 1 Glasses of wine per week    Comment: 1 glass of wine per night   FAMILY HX:  Family History  Problem Relation Age of Onset   Heart disease Mother    Pneumonia Father       ALLERGIES:  Allergies  Allergen Reactions   Ciprofloxacin     Muscle spasm    Iodinated Diagnostic Agents     Iodine DYe   Other     Dust Mites     PERTINENT MEDICATIONS:  Outpatient Encounter Medications as of 05/03/2021  Medication Sig   acetaminophen (TYLENOL) 325 MG tablet Take 650 mg by mouth 2 (two) times daily.   acidophilus (RISAQUAD) CAPS capsule Take 1 capsule by mouth daily.   Ascorbic Acid (VITAMIN C) 1000 MG tablet Take 1,000 mg by mouth daily.   aspirin EC 81 MG tablet Take 81 mg by mouth  daily.   Cholecalciferol (VITAMIN D3) 50 MCG (2000 UT) TABS Take 1 tablet by mouth daily.   diltiazem (CARDIZEM CD) 120 MG 24 hr capsule Take 1 capsule (120 mg total) by mouth daily.   escitalopram (LEXAPRO) 10 MG tablet Take 10 mg by mouth daily.   levothyroxine (SYNTHROID) 75 MCG tablet Take 75 mcg by mouth daily before breakfast.   loratadine (CLARITIN) 10 MG tablet Take 10 mg by mouth daily.   traZODone (DESYREL) 50 MG tablet Take 25 mg by mouth at bedtime.   No facility-administered encounter medications on file as of 05/03/2021.   Thank you for the opportunity to participate in the care of Karen Colon.  The palliative care team will continue to follow. Please call our office at [phone] if we can be of additional assistance.   Ezekiel Slocumb, NP   COVID-19 PATIENT SCREENING TOOL Asked and negative response  unless otherwise noted:  Have you had symptoms of covid, tested positive or been in contact with someone with symptoms/positive test in the past 5-10 days? No

## 2021-06-01 DIAGNOSIS — E441 Mild protein-calorie malnutrition: Secondary | ICD-10-CM | POA: Diagnosis not present

## 2021-06-19 DIAGNOSIS — M159 Polyosteoarthritis, unspecified: Secondary | ICD-10-CM

## 2021-06-19 DIAGNOSIS — F0150 Vascular dementia without behavioral disturbance: Secondary | ICD-10-CM | POA: Diagnosis not present

## 2021-06-19 DIAGNOSIS — E441 Mild protein-calorie malnutrition: Secondary | ICD-10-CM

## 2021-06-19 DIAGNOSIS — F39 Unspecified mood [affective] disorder: Secondary | ICD-10-CM

## 2021-06-19 DIAGNOSIS — I480 Paroxysmal atrial fibrillation: Secondary | ICD-10-CM

## 2021-06-24 DIAGNOSIS — F339 Major depressive disorder, recurrent, unspecified: Secondary | ICD-10-CM | POA: Diagnosis not present

## 2021-06-24 DIAGNOSIS — G4700 Insomnia, unspecified: Secondary | ICD-10-CM | POA: Diagnosis not present

## 2021-06-24 DIAGNOSIS — I480 Paroxysmal atrial fibrillation: Secondary | ICD-10-CM | POA: Diagnosis not present

## 2021-06-24 DIAGNOSIS — R4189 Other symptoms and signs involving cognitive functions and awareness: Secondary | ICD-10-CM | POA: Diagnosis not present

## 2021-06-24 DIAGNOSIS — G309 Alzheimer's disease, unspecified: Secondary | ICD-10-CM | POA: Diagnosis not present

## 2021-06-24 DIAGNOSIS — Z741 Need for assistance with personal care: Secondary | ICD-10-CM | POA: Diagnosis not present

## 2021-06-24 DIAGNOSIS — R278 Other lack of coordination: Secondary | ICD-10-CM | POA: Diagnosis not present

## 2021-06-24 DIAGNOSIS — I10 Essential (primary) hypertension: Secondary | ICD-10-CM | POA: Diagnosis not present

## 2021-06-24 DIAGNOSIS — F0150 Vascular dementia without behavioral disturbance: Secondary | ICD-10-CM | POA: Diagnosis not present

## 2021-06-24 DIAGNOSIS — R296 Repeated falls: Secondary | ICD-10-CM | POA: Diagnosis not present

## 2021-06-24 DIAGNOSIS — M6281 Muscle weakness (generalized): Secondary | ICD-10-CM | POA: Diagnosis not present

## 2021-06-28 DIAGNOSIS — I10 Essential (primary) hypertension: Secondary | ICD-10-CM | POA: Diagnosis not present

## 2021-06-28 DIAGNOSIS — I480 Paroxysmal atrial fibrillation: Secondary | ICD-10-CM | POA: Diagnosis not present

## 2021-06-28 DIAGNOSIS — G309 Alzheimer's disease, unspecified: Secondary | ICD-10-CM | POA: Diagnosis not present

## 2021-06-28 DIAGNOSIS — R4189 Other symptoms and signs involving cognitive functions and awareness: Secondary | ICD-10-CM | POA: Diagnosis not present

## 2021-06-28 DIAGNOSIS — F0150 Vascular dementia without behavioral disturbance: Secondary | ICD-10-CM | POA: Diagnosis not present

## 2021-06-28 DIAGNOSIS — G4700 Insomnia, unspecified: Secondary | ICD-10-CM | POA: Diagnosis not present

## 2021-06-28 DIAGNOSIS — R278 Other lack of coordination: Secondary | ICD-10-CM | POA: Diagnosis not present

## 2021-06-28 DIAGNOSIS — R296 Repeated falls: Secondary | ICD-10-CM | POA: Diagnosis not present

## 2021-06-28 DIAGNOSIS — M6281 Muscle weakness (generalized): Secondary | ICD-10-CM | POA: Diagnosis not present

## 2021-06-28 DIAGNOSIS — Z741 Need for assistance with personal care: Secondary | ICD-10-CM | POA: Diagnosis not present

## 2021-06-28 DIAGNOSIS — F339 Major depressive disorder, recurrent, unspecified: Secondary | ICD-10-CM | POA: Diagnosis not present

## 2021-07-11 DIAGNOSIS — R4189 Other symptoms and signs involving cognitive functions and awareness: Secondary | ICD-10-CM | POA: Diagnosis not present

## 2021-07-11 DIAGNOSIS — R278 Other lack of coordination: Secondary | ICD-10-CM | POA: Diagnosis not present

## 2021-07-11 DIAGNOSIS — I10 Essential (primary) hypertension: Secondary | ICD-10-CM | POA: Diagnosis not present

## 2021-07-11 DIAGNOSIS — M6281 Muscle weakness (generalized): Secondary | ICD-10-CM | POA: Diagnosis not present

## 2021-07-11 DIAGNOSIS — F339 Major depressive disorder, recurrent, unspecified: Secondary | ICD-10-CM | POA: Diagnosis not present

## 2021-07-11 DIAGNOSIS — G309 Alzheimer's disease, unspecified: Secondary | ICD-10-CM | POA: Diagnosis not present

## 2021-07-11 DIAGNOSIS — F0150 Vascular dementia without behavioral disturbance: Secondary | ICD-10-CM | POA: Diagnosis not present

## 2021-07-11 DIAGNOSIS — G4700 Insomnia, unspecified: Secondary | ICD-10-CM | POA: Diagnosis not present

## 2021-07-11 DIAGNOSIS — R296 Repeated falls: Secondary | ICD-10-CM | POA: Diagnosis not present

## 2021-07-11 DIAGNOSIS — I480 Paroxysmal atrial fibrillation: Secondary | ICD-10-CM | POA: Diagnosis not present

## 2021-07-11 DIAGNOSIS — Z741 Need for assistance with personal care: Secondary | ICD-10-CM | POA: Diagnosis not present

## 2021-07-12 DIAGNOSIS — F0150 Vascular dementia without behavioral disturbance: Secondary | ICD-10-CM | POA: Diagnosis not present

## 2021-07-12 DIAGNOSIS — R296 Repeated falls: Secondary | ICD-10-CM | POA: Diagnosis not present

## 2021-07-12 DIAGNOSIS — R278 Other lack of coordination: Secondary | ICD-10-CM | POA: Diagnosis not present

## 2021-07-12 DIAGNOSIS — R4189 Other symptoms and signs involving cognitive functions and awareness: Secondary | ICD-10-CM | POA: Diagnosis not present

## 2021-07-12 DIAGNOSIS — F339 Major depressive disorder, recurrent, unspecified: Secondary | ICD-10-CM | POA: Diagnosis not present

## 2021-07-12 DIAGNOSIS — G309 Alzheimer's disease, unspecified: Secondary | ICD-10-CM | POA: Diagnosis not present

## 2021-07-12 DIAGNOSIS — G4700 Insomnia, unspecified: Secondary | ICD-10-CM | POA: Diagnosis not present

## 2021-07-12 DIAGNOSIS — I480 Paroxysmal atrial fibrillation: Secondary | ICD-10-CM | POA: Diagnosis not present

## 2021-07-12 DIAGNOSIS — Z741 Need for assistance with personal care: Secondary | ICD-10-CM | POA: Diagnosis not present

## 2021-07-12 DIAGNOSIS — M6281 Muscle weakness (generalized): Secondary | ICD-10-CM | POA: Diagnosis not present

## 2021-07-12 DIAGNOSIS — I10 Essential (primary) hypertension: Secondary | ICD-10-CM | POA: Diagnosis not present

## 2021-07-15 DIAGNOSIS — I480 Paroxysmal atrial fibrillation: Secondary | ICD-10-CM | POA: Diagnosis not present

## 2021-07-15 DIAGNOSIS — M6281 Muscle weakness (generalized): Secondary | ICD-10-CM | POA: Diagnosis not present

## 2021-07-15 DIAGNOSIS — G309 Alzheimer's disease, unspecified: Secondary | ICD-10-CM | POA: Diagnosis not present

## 2021-07-15 DIAGNOSIS — R4189 Other symptoms and signs involving cognitive functions and awareness: Secondary | ICD-10-CM | POA: Diagnosis not present

## 2021-07-15 DIAGNOSIS — R296 Repeated falls: Secondary | ICD-10-CM | POA: Diagnosis not present

## 2021-07-15 DIAGNOSIS — I10 Essential (primary) hypertension: Secondary | ICD-10-CM | POA: Diagnosis not present

## 2021-07-15 DIAGNOSIS — G4700 Insomnia, unspecified: Secondary | ICD-10-CM | POA: Diagnosis not present

## 2021-07-15 DIAGNOSIS — Z741 Need for assistance with personal care: Secondary | ICD-10-CM | POA: Diagnosis not present

## 2021-07-15 DIAGNOSIS — R278 Other lack of coordination: Secondary | ICD-10-CM | POA: Diagnosis not present

## 2021-07-15 DIAGNOSIS — F0150 Vascular dementia without behavioral disturbance: Secondary | ICD-10-CM | POA: Diagnosis not present

## 2021-07-15 DIAGNOSIS — F339 Major depressive disorder, recurrent, unspecified: Secondary | ICD-10-CM | POA: Diagnosis not present

## 2021-07-16 DIAGNOSIS — R278 Other lack of coordination: Secondary | ICD-10-CM | POA: Diagnosis not present

## 2021-07-16 DIAGNOSIS — F339 Major depressive disorder, recurrent, unspecified: Secondary | ICD-10-CM | POA: Diagnosis not present

## 2021-07-16 DIAGNOSIS — R296 Repeated falls: Secondary | ICD-10-CM | POA: Diagnosis not present

## 2021-07-16 DIAGNOSIS — Z741 Need for assistance with personal care: Secondary | ICD-10-CM | POA: Diagnosis not present

## 2021-07-16 DIAGNOSIS — M6281 Muscle weakness (generalized): Secondary | ICD-10-CM | POA: Diagnosis not present

## 2021-07-16 DIAGNOSIS — F0150 Vascular dementia without behavioral disturbance: Secondary | ICD-10-CM | POA: Diagnosis not present

## 2021-07-16 DIAGNOSIS — G4700 Insomnia, unspecified: Secondary | ICD-10-CM | POA: Diagnosis not present

## 2021-07-16 DIAGNOSIS — R4189 Other symptoms and signs involving cognitive functions and awareness: Secondary | ICD-10-CM | POA: Diagnosis not present

## 2021-07-16 DIAGNOSIS — I10 Essential (primary) hypertension: Secondary | ICD-10-CM | POA: Diagnosis not present

## 2021-07-16 DIAGNOSIS — G309 Alzheimer's disease, unspecified: Secondary | ICD-10-CM | POA: Diagnosis not present

## 2021-07-16 DIAGNOSIS — I480 Paroxysmal atrial fibrillation: Secondary | ICD-10-CM | POA: Diagnosis not present

## 2021-07-17 DIAGNOSIS — B351 Tinea unguium: Secondary | ICD-10-CM | POA: Diagnosis not present

## 2021-07-17 DIAGNOSIS — M2011 Hallux valgus (acquired), right foot: Secondary | ICD-10-CM | POA: Diagnosis not present

## 2021-07-17 DIAGNOSIS — M2012 Hallux valgus (acquired), left foot: Secondary | ICD-10-CM | POA: Diagnosis not present

## 2021-07-17 DIAGNOSIS — M2041 Other hammer toe(s) (acquired), right foot: Secondary | ICD-10-CM | POA: Diagnosis not present

## 2021-07-19 DIAGNOSIS — I10 Essential (primary) hypertension: Secondary | ICD-10-CM | POA: Diagnosis not present

## 2021-07-19 DIAGNOSIS — R296 Repeated falls: Secondary | ICD-10-CM | POA: Diagnosis not present

## 2021-07-19 DIAGNOSIS — F0150 Vascular dementia without behavioral disturbance: Secondary | ICD-10-CM | POA: Diagnosis not present

## 2021-07-19 DIAGNOSIS — I480 Paroxysmal atrial fibrillation: Secondary | ICD-10-CM | POA: Diagnosis not present

## 2021-07-19 DIAGNOSIS — M6281 Muscle weakness (generalized): Secondary | ICD-10-CM | POA: Diagnosis not present

## 2021-07-19 DIAGNOSIS — R4189 Other symptoms and signs involving cognitive functions and awareness: Secondary | ICD-10-CM | POA: Diagnosis not present

## 2021-07-19 DIAGNOSIS — Z741 Need for assistance with personal care: Secondary | ICD-10-CM | POA: Diagnosis not present

## 2021-07-19 DIAGNOSIS — R278 Other lack of coordination: Secondary | ICD-10-CM | POA: Diagnosis not present

## 2021-07-19 DIAGNOSIS — G309 Alzheimer's disease, unspecified: Secondary | ICD-10-CM | POA: Diagnosis not present

## 2021-07-19 DIAGNOSIS — F339 Major depressive disorder, recurrent, unspecified: Secondary | ICD-10-CM | POA: Diagnosis not present

## 2021-07-19 DIAGNOSIS — G4700 Insomnia, unspecified: Secondary | ICD-10-CM | POA: Diagnosis not present

## 2021-07-21 DIAGNOSIS — H90A22 Sensorineural hearing loss, unilateral, left ear, with restricted hearing on the contralateral side: Secondary | ICD-10-CM | POA: Diagnosis not present

## 2021-07-24 DIAGNOSIS — R296 Repeated falls: Secondary | ICD-10-CM | POA: Diagnosis not present

## 2021-07-24 DIAGNOSIS — Z741 Need for assistance with personal care: Secondary | ICD-10-CM | POA: Diagnosis not present

## 2021-07-24 DIAGNOSIS — G4700 Insomnia, unspecified: Secondary | ICD-10-CM | POA: Diagnosis not present

## 2021-07-24 DIAGNOSIS — F0150 Vascular dementia without behavioral disturbance: Secondary | ICD-10-CM | POA: Diagnosis not present

## 2021-07-24 DIAGNOSIS — F339 Major depressive disorder, recurrent, unspecified: Secondary | ICD-10-CM | POA: Diagnosis not present

## 2021-07-24 DIAGNOSIS — G309 Alzheimer's disease, unspecified: Secondary | ICD-10-CM | POA: Diagnosis not present

## 2021-07-24 DIAGNOSIS — M6281 Muscle weakness (generalized): Secondary | ICD-10-CM | POA: Diagnosis not present

## 2021-07-24 DIAGNOSIS — R4189 Other symptoms and signs involving cognitive functions and awareness: Secondary | ICD-10-CM | POA: Diagnosis not present

## 2021-07-24 DIAGNOSIS — I10 Essential (primary) hypertension: Secondary | ICD-10-CM | POA: Diagnosis not present

## 2021-07-24 DIAGNOSIS — R278 Other lack of coordination: Secondary | ICD-10-CM | POA: Diagnosis not present

## 2021-07-24 DIAGNOSIS — I480 Paroxysmal atrial fibrillation: Secondary | ICD-10-CM | POA: Diagnosis not present

## 2021-07-26 DIAGNOSIS — Z741 Need for assistance with personal care: Secondary | ICD-10-CM | POA: Diagnosis not present

## 2021-07-26 DIAGNOSIS — F339 Major depressive disorder, recurrent, unspecified: Secondary | ICD-10-CM | POA: Diagnosis not present

## 2021-07-26 DIAGNOSIS — M6281 Muscle weakness (generalized): Secondary | ICD-10-CM | POA: Diagnosis not present

## 2021-07-26 DIAGNOSIS — I10 Essential (primary) hypertension: Secondary | ICD-10-CM | POA: Diagnosis not present

## 2021-07-26 DIAGNOSIS — I480 Paroxysmal atrial fibrillation: Secondary | ICD-10-CM | POA: Diagnosis not present

## 2021-07-26 DIAGNOSIS — R278 Other lack of coordination: Secondary | ICD-10-CM | POA: Diagnosis not present

## 2021-07-26 DIAGNOSIS — R4189 Other symptoms and signs involving cognitive functions and awareness: Secondary | ICD-10-CM | POA: Diagnosis not present

## 2021-07-26 DIAGNOSIS — G309 Alzheimer's disease, unspecified: Secondary | ICD-10-CM | POA: Diagnosis not present

## 2021-07-26 DIAGNOSIS — G4700 Insomnia, unspecified: Secondary | ICD-10-CM | POA: Diagnosis not present

## 2021-07-26 DIAGNOSIS — F0150 Vascular dementia without behavioral disturbance: Secondary | ICD-10-CM | POA: Diagnosis not present

## 2021-07-26 DIAGNOSIS — R296 Repeated falls: Secondary | ICD-10-CM | POA: Diagnosis not present

## 2021-07-28 DIAGNOSIS — F339 Major depressive disorder, recurrent, unspecified: Secondary | ICD-10-CM | POA: Diagnosis not present

## 2021-07-28 DIAGNOSIS — R296 Repeated falls: Secondary | ICD-10-CM | POA: Diagnosis not present

## 2021-07-28 DIAGNOSIS — Z741 Need for assistance with personal care: Secondary | ICD-10-CM | POA: Diagnosis not present

## 2021-07-28 DIAGNOSIS — R4189 Other symptoms and signs involving cognitive functions and awareness: Secondary | ICD-10-CM | POA: Diagnosis not present

## 2021-07-28 DIAGNOSIS — G4700 Insomnia, unspecified: Secondary | ICD-10-CM | POA: Diagnosis not present

## 2021-07-28 DIAGNOSIS — I480 Paroxysmal atrial fibrillation: Secondary | ICD-10-CM | POA: Diagnosis not present

## 2021-07-28 DIAGNOSIS — I10 Essential (primary) hypertension: Secondary | ICD-10-CM | POA: Diagnosis not present

## 2021-07-28 DIAGNOSIS — G309 Alzheimer's disease, unspecified: Secondary | ICD-10-CM | POA: Diagnosis not present

## 2021-07-28 DIAGNOSIS — F0150 Vascular dementia without behavioral disturbance: Secondary | ICD-10-CM | POA: Diagnosis not present

## 2021-07-28 DIAGNOSIS — R278 Other lack of coordination: Secondary | ICD-10-CM | POA: Diagnosis not present

## 2021-07-28 DIAGNOSIS — M6281 Muscle weakness (generalized): Secondary | ICD-10-CM | POA: Diagnosis not present

## 2021-08-07 DIAGNOSIS — R278 Other lack of coordination: Secondary | ICD-10-CM | POA: Diagnosis not present

## 2021-08-07 DIAGNOSIS — G309 Alzheimer's disease, unspecified: Secondary | ICD-10-CM | POA: Diagnosis not present

## 2021-08-07 DIAGNOSIS — M6281 Muscle weakness (generalized): Secondary | ICD-10-CM | POA: Diagnosis not present

## 2021-08-07 DIAGNOSIS — R296 Repeated falls: Secondary | ICD-10-CM | POA: Diagnosis not present

## 2021-08-07 DIAGNOSIS — I480 Paroxysmal atrial fibrillation: Secondary | ICD-10-CM | POA: Diagnosis not present

## 2021-08-07 DIAGNOSIS — Z741 Need for assistance with personal care: Secondary | ICD-10-CM | POA: Diagnosis not present

## 2021-08-07 DIAGNOSIS — F0150 Vascular dementia without behavioral disturbance: Secondary | ICD-10-CM | POA: Diagnosis not present

## 2021-08-07 DIAGNOSIS — G4700 Insomnia, unspecified: Secondary | ICD-10-CM | POA: Diagnosis not present

## 2021-08-07 DIAGNOSIS — I10 Essential (primary) hypertension: Secondary | ICD-10-CM | POA: Diagnosis not present

## 2021-08-07 DIAGNOSIS — R4189 Other symptoms and signs involving cognitive functions and awareness: Secondary | ICD-10-CM | POA: Diagnosis not present

## 2021-08-07 DIAGNOSIS — F339 Major depressive disorder, recurrent, unspecified: Secondary | ICD-10-CM | POA: Diagnosis not present

## 2021-08-09 DIAGNOSIS — G4700 Insomnia, unspecified: Secondary | ICD-10-CM | POA: Diagnosis not present

## 2021-08-09 DIAGNOSIS — Z741 Need for assistance with personal care: Secondary | ICD-10-CM | POA: Diagnosis not present

## 2021-08-09 DIAGNOSIS — R278 Other lack of coordination: Secondary | ICD-10-CM | POA: Diagnosis not present

## 2021-08-09 DIAGNOSIS — F0150 Vascular dementia without behavioral disturbance: Secondary | ICD-10-CM | POA: Diagnosis not present

## 2021-08-09 DIAGNOSIS — E039 Hypothyroidism, unspecified: Secondary | ICD-10-CM

## 2021-08-09 DIAGNOSIS — I4891 Unspecified atrial fibrillation: Secondary | ICD-10-CM | POA: Diagnosis not present

## 2021-08-09 DIAGNOSIS — G309 Alzheimer's disease, unspecified: Secondary | ICD-10-CM | POA: Diagnosis not present

## 2021-08-09 DIAGNOSIS — I10 Essential (primary) hypertension: Secondary | ICD-10-CM | POA: Diagnosis not present

## 2021-08-09 DIAGNOSIS — R296 Repeated falls: Secondary | ICD-10-CM | POA: Diagnosis not present

## 2021-08-09 DIAGNOSIS — I480 Paroxysmal atrial fibrillation: Secondary | ICD-10-CM | POA: Diagnosis not present

## 2021-08-09 DIAGNOSIS — F39 Unspecified mood [affective] disorder: Secondary | ICD-10-CM | POA: Diagnosis not present

## 2021-08-09 DIAGNOSIS — F339 Major depressive disorder, recurrent, unspecified: Secondary | ICD-10-CM | POA: Diagnosis not present

## 2021-08-09 DIAGNOSIS — R4189 Other symptoms and signs involving cognitive functions and awareness: Secondary | ICD-10-CM | POA: Diagnosis not present

## 2021-08-09 DIAGNOSIS — E43 Unspecified severe protein-calorie malnutrition: Secondary | ICD-10-CM | POA: Diagnosis not present

## 2021-08-09 DIAGNOSIS — M6281 Muscle weakness (generalized): Secondary | ICD-10-CM | POA: Diagnosis not present

## 2021-08-09 DIAGNOSIS — M1990 Unspecified osteoarthritis, unspecified site: Secondary | ICD-10-CM

## 2021-10-04 ENCOUNTER — Non-Acute Institutional Stay: Payer: Medicare Other | Admitting: Student

## 2021-10-04 ENCOUNTER — Other Ambulatory Visit: Payer: Self-pay

## 2021-10-04 DIAGNOSIS — F0150 Vascular dementia without behavioral disturbance: Secondary | ICD-10-CM

## 2021-10-04 DIAGNOSIS — Z515 Encounter for palliative care: Secondary | ICD-10-CM

## 2021-10-04 NOTE — Progress Notes (Signed)
[facility] Consult Note Telephone: [phone]  Fax: [phone]    Date of encounter: 10/04/21  PATIENT NAME: Karen Colon [address] [place] [address]   [phone] (home)  DOB: 07/07/1926 MRN: [id number] PRIMARY CARE PROVIDER:    Venia Carbon, MD,  [place] [address] [phone]  REFERRING PROVIDER:   Venia Carbon, MD [address] [place],  [place] [address] [phone]  RESPONSIBLE PARTY:    Contact Information     Name Relation Home Work [place]   Saniah, Schroeter Daughter   [phone]        I met face to face with patient and family in the facility. Palliative Care was asked to follow this patient by consultation request of  Venia Carbon, MD to address advance care planning and complex medical decision making. This is a follow up visit.                                   ASSESSMENT AND PLAN / RECOMMENDATIONS:   Advance Care Planning/Goals of Care: Goals include to maximize quality of life and symptom management. Patient/health care surrogate gave his/her permission to discuss. CODE STATUS: DNR  Symptom Management/Plan:  Dementia-no recent changes or declines reported. Staff to reorient/redirect as needed.  Assist with ADLs as needed.  Monitor for falls/safety. Monitor for cognitive and functional declines.   Depression-mood is stable. Monitor for changes in mood, behavior.   Follow up Palliative Care Visit: Palliative care will continue to follow for complex medical decision making, advance care planning, and clarification of goals. Return in 8 weeks or prn.   This visit was coded based on medical decision making (MDM).  PPS: 40%  HOSPICE ELIGIBILITY/DIAGNOSIS: TBD  Chief Complaint: Palliative Medicine follow up visit.   HISTORY OF PRESENT ILLNESS:  Karen Colon is a 88 y.o. year old female  with  Vascular dementia, hypothyroidism, osteoporosis, depression,  atrial fibrillation, allergic rhinitis,  hx of pelvic fracture. Hospital on 7/25-7/26 s/p fall with subsequent rib fracture.   Patient resides at [facility]  Patient reports doing well.  She denies having any pain, shortness of breath, constipation or nausea. Staff denies any recent falls or injury. She endorses a good appetite. No sleep difficulty reported. Mood has been stable; no behaviors reported. No recent ED visits or hospitalizations.  No acute changes or needs per facility staff. A 10-point review of systems is negative, except for the pertinent positives and negatives detailed in the HPI.    History obtained from review of EMR, discussion with primary team, and interview with family, facility staff/caregiver and/or Karen Colon.  I reviewed available labs, medications, imaging, studies and related documents from the EMR.  Records reviewed and summarized above.    Physical Exam: Current and past weights: Weight 149 pounds Pulse 72, respirations 16, blood pressure 120/76, sats 98% on room air Constitutional: NAD General: frail appearing EYES: anicteric sclera, lids intact, no discharge  ENMT: intact hearing, oral mucous membranes moist, dentition intact CV: S1S2, RRR, no LE edema Pulmonary: LCTA, no increased work of breathing, no cough, room air Abdomen: normo-active BS + 4 quadrants, soft and non tender, no ascites GU: deferred MSK: moves all extremities Skin: warm and dry, no rashes or wounds on visible skin Neuro:  no generalized weakness, A&O x2, forgetful Psych: non-anxious affect Hem/lymph/immuno: no widespread bruising  Thank you for the opportunity to participate in the care of Karen Colon.  The palliative care team will continue to follow. Please call our office at [phone] if we can be of additional assistance.  ° ° S , NP  ° °COVID-19 PATIENT SCREENING TOOL °Asked and negative response unless otherwise noted:  ° °Have you had symptoms of covid,  tested positive or been in contact with someone with symptoms/positive test in the past 5-10 days? No ° °

## 2021-10-07 DIAGNOSIS — R52 Pain, unspecified: Secondary | ICD-10-CM | POA: Diagnosis not present

## 2021-10-07 DIAGNOSIS — W19XXXA Unspecified fall, initial encounter: Secondary | ICD-10-CM | POA: Diagnosis not present

## 2021-10-09 DIAGNOSIS — I480 Paroxysmal atrial fibrillation: Secondary | ICD-10-CM

## 2021-10-09 DIAGNOSIS — E441 Mild protein-calorie malnutrition: Secondary | ICD-10-CM

## 2021-10-09 DIAGNOSIS — E039 Hypothyroidism, unspecified: Secondary | ICD-10-CM

## 2021-10-09 DIAGNOSIS — F0150 Vascular dementia without behavioral disturbance: Secondary | ICD-10-CM

## 2021-10-09 DIAGNOSIS — F39 Unspecified mood [affective] disorder: Secondary | ICD-10-CM

## 2021-10-09 DIAGNOSIS — M159 Polyosteoarthritis, unspecified: Secondary | ICD-10-CM

## 2021-10-30 DIAGNOSIS — I7091 Generalized atherosclerosis: Secondary | ICD-10-CM | POA: Diagnosis not present

## 2021-10-30 DIAGNOSIS — B351 Tinea unguium: Secondary | ICD-10-CM | POA: Diagnosis not present

## 2021-12-06 DIAGNOSIS — F0150 Vascular dementia without behavioral disturbance: Secondary | ICD-10-CM

## 2021-12-06 DIAGNOSIS — E039 Hypothyroidism, unspecified: Secondary | ICD-10-CM

## 2021-12-06 DIAGNOSIS — E43 Unspecified severe protein-calorie malnutrition: Secondary | ICD-10-CM

## 2021-12-06 DIAGNOSIS — M1990 Unspecified osteoarthritis, unspecified site: Secondary | ICD-10-CM

## 2021-12-06 DIAGNOSIS — F39 Unspecified mood [affective] disorder: Secondary | ICD-10-CM

## 2021-12-06 DIAGNOSIS — I4891 Unspecified atrial fibrillation: Secondary | ICD-10-CM

## 2022-01-01 DIAGNOSIS — B351 Tinea unguium: Secondary | ICD-10-CM | POA: Diagnosis not present

## 2022-01-01 DIAGNOSIS — I7091 Generalized atherosclerosis: Secondary | ICD-10-CM | POA: Diagnosis not present

## 2022-01-03 ENCOUNTER — Non-Acute Institutional Stay: Payer: Medicare Other | Admitting: Student

## 2022-01-03 DIAGNOSIS — Z515 Encounter for palliative care: Secondary | ICD-10-CM

## 2022-01-03 DIAGNOSIS — F0393 Unspecified dementia, unspecified severity, with mood disturbance: Secondary | ICD-10-CM | POA: Diagnosis not present

## 2022-01-03 NOTE — Progress Notes (Signed)
? ? ?[facility] ?[facility]iative [facility] Consult Note ?Telephone: [phone]  ?Fax: [phone]  ? ? ?Date of encounter: 01/03/22 ?12:57 PM ?PATIENT NAME: Karen Colon ?Karen Colon [place] [address]   ?[phone] (home)  ?DOB: [birth year] ?MRN: [id number] ?PRIMARY CARE PROVIDER:    ?Karen Carbon, MD,  ?[address] [place] [address] ?[phone] ? ?REFERRING PROVIDER:   ?Karen Carbon, MD ?[place] ?[place]on,  Karen [address] ?[phone] ? ?RESPONSIBLE PARTY:    ?Contact Information   ? ? Name Relation Home Work [place]  ? Jazsmine, Macari Daughter   [phone]  ? ?  ? ? ? ?I met face to face with patient and family in the facility. Palliative Care was asked to follow this patient by consultation request of  Karen Carbon, MD to address advance care planning and complex medical decision making. This is a follow up visit. ? ?                                 ASSESSMENT AND PLAN / RECOMMENDATIONS:  ? ?Advance Care Planning/Goals of Care: Goals include to maximize quality of life and symptom management. Patient/health care surrogate gave his/her permission to discuss. ?CODE STATUS: DNR ? ?Symptom Management/Plan: ? ?Dementia-patient has been stable; no recent changes or declines reported. Staff to reorient/redirect as needed.  Assist with ADLs as needed.  Monitor for falls/safety. Monitor for cognitive and functional declines.  ?  ? ?Follow up Palliative Care Visit: Palliative care will continue to follow for complex medical decision making, advance care planning, and clarification of goals. Return in 12 weeks or prn. ? ? ?This visit was coded based on medical decision making (MDM). ? ?PPS: 40% ? ?HOSPICE ELIGIBILITY/DIAGNOSIS: TBD ? ?Chief Complaint: Palliative Medicine follow up visit.  ? ?HISTORY OF PRESENT ILLNESS:  [name] is a 88 y.o. year old female  with Vascular dementia, hypothyroidism, osteoporosis, depression, atrial fibrillation, allergic  rhinitis,  hx of pelvic fracture.   ? ?Patient resides at [facility]. She has been stable per staff. She denies pain, shortness of breath, nausea, constipation. No falls reported. She does require some assistance with adl's. Good appetite reported; goes to dining room for meals. No sleep difficulty reported. No recent ED visits or hospitalizations. A 10 point ROS is negative, except for the pertinent positives and negatives detailed per the HPI.  ? ?History obtained from review of EMR, discussion with primary team, and interview with family, facility staff/caregiver and/or [name].  ?I reviewed available labs, medications, imaging, studies and related documents from the EMR.  Records reviewed and summarized above.  ? ? ?Physical Exam: ?Weight: 149 pounds ?Constitutional: NAD ?General: frail appearing ?EYES: anicteric sclera, lids intact, no discharge  ?ENMT: intact hearing, oral mucous membranes moist, dentition intact ?CV: S1S2, RRR, no LE edema ?Pulmonary: LCTA, no increased work of breathing, no cough, room air ?Abdomen: normo-active BS + 4 quadrants, soft and non tender, no ascites ?GU: deferred ?MSK: moves all extremities, ambulatory ?Skin: warm and dry, no rashes or wounds on visible skin ?Neuro:  no generalized weakness,  A & O x 2, forgetful ?Psych: non-anxious affect ?Hem/lymph/immuno: no widespread bruising ? ? ?Thank you for the opportunity to participate in the care of [name].  The palliative care team will continue to follow. Please call our office at [phone] if we can be of additional assistance.  ? ?  Ezekiel Slocumb, NP  ? ?COVID-19 PATIENT SCREENING TOOL ?Asked and negative response unless otherwise noted:  ? ?Have you had symptoms of covid, tested positive or been in contact with someone with symptoms/positive test in the past 5-10 days? No ? ?

## 2022-01-22 DIAGNOSIS — E039 Hypothyroidism, unspecified: Secondary | ICD-10-CM | POA: Diagnosis not present

## 2022-01-22 DIAGNOSIS — I10 Essential (primary) hypertension: Secondary | ICD-10-CM | POA: Diagnosis not present

## 2022-01-23 LAB — BASIC METABOLIC PANEL
BUN: 17 (ref 4–21)
CO2: 31 — AB (ref 13–22)
Chloride: 103 (ref 99–108)
Creatinine: 0.7 (ref 0.5–1.1)
Glucose: 74
Potassium: 3.9 mEq/L (ref 3.5–5.1)
Sodium: 141 (ref 137–147)

## 2022-01-23 LAB — HEPATIC FUNCTION PANEL
ALT: 13 U/L (ref 7–35)
AST: 16 (ref 13–35)
Alkaline Phosphatase: 112 (ref 25–125)
Bilirubin, Total: 0.4

## 2022-01-23 LAB — COMPREHENSIVE METABOLIC PANEL
Albumin: 4 (ref 3.5–5.0)
Calcium: 9.5 (ref 8.7–10.7)
Globulin: 2.4

## 2022-01-23 LAB — CBC AND DIFFERENTIAL
HCT: 39 (ref 36–46)
Hemoglobin: 12.8 (ref 12.0–16.0)
Platelets: 347 10*3/uL (ref 150–400)
WBC: 5.9

## 2022-01-23 LAB — CBC: RBC: 4.39 (ref 3.87–5.11)

## 2022-02-08 DIAGNOSIS — F0150 Vascular dementia without behavioral disturbance: Secondary | ICD-10-CM | POA: Diagnosis not present

## 2022-02-08 DIAGNOSIS — M159 Polyosteoarthritis, unspecified: Secondary | ICD-10-CM | POA: Diagnosis not present

## 2022-02-08 DIAGNOSIS — I480 Paroxysmal atrial fibrillation: Secondary | ICD-10-CM | POA: Diagnosis not present

## 2022-02-08 DIAGNOSIS — E039 Hypothyroidism, unspecified: Secondary | ICD-10-CM

## 2022-02-08 DIAGNOSIS — F39 Unspecified mood [affective] disorder: Secondary | ICD-10-CM | POA: Diagnosis not present

## 2022-03-07 DIAGNOSIS — B351 Tinea unguium: Secondary | ICD-10-CM | POA: Diagnosis not present

## 2022-03-07 DIAGNOSIS — I7091 Generalized atherosclerosis: Secondary | ICD-10-CM | POA: Diagnosis not present

## 2022-04-13 DIAGNOSIS — F0150 Vascular dementia without behavioral disturbance: Secondary | ICD-10-CM | POA: Diagnosis not present

## 2022-04-13 DIAGNOSIS — E039 Hypothyroidism, unspecified: Secondary | ICD-10-CM

## 2022-04-13 DIAGNOSIS — M1990 Unspecified osteoarthritis, unspecified site: Secondary | ICD-10-CM | POA: Diagnosis not present

## 2022-04-13 DIAGNOSIS — F39 Unspecified mood [affective] disorder: Secondary | ICD-10-CM | POA: Diagnosis not present

## 2022-04-13 DIAGNOSIS — I4891 Unspecified atrial fibrillation: Secondary | ICD-10-CM | POA: Diagnosis not present

## 2022-06-11 DIAGNOSIS — E039 Hypothyroidism, unspecified: Secondary | ICD-10-CM

## 2022-06-11 DIAGNOSIS — M159 Polyosteoarthritis, unspecified: Secondary | ICD-10-CM | POA: Diagnosis not present

## 2022-06-11 DIAGNOSIS — F0150 Vascular dementia without behavioral disturbance: Secondary | ICD-10-CM | POA: Diagnosis not present

## 2022-06-11 DIAGNOSIS — F0153 Vascular dementia, unspecified severity, with mood disturbance: Secondary | ICD-10-CM | POA: Diagnosis not present

## 2022-06-11 DIAGNOSIS — I480 Paroxysmal atrial fibrillation: Secondary | ICD-10-CM | POA: Diagnosis not present

## 2022-06-11 DIAGNOSIS — F39 Unspecified mood [affective] disorder: Secondary | ICD-10-CM | POA: Diagnosis not present

## 2022-06-11 DIAGNOSIS — I4891 Unspecified atrial fibrillation: Secondary | ICD-10-CM | POA: Diagnosis not present

## 2022-06-11 DIAGNOSIS — M170 Bilateral primary osteoarthritis of knee: Secondary | ICD-10-CM | POA: Diagnosis not present

## 2022-06-14 DIAGNOSIS — B351 Tinea unguium: Secondary | ICD-10-CM | POA: Diagnosis not present

## 2022-06-14 DIAGNOSIS — I7091 Generalized atherosclerosis: Secondary | ICD-10-CM | POA: Diagnosis not present

## 2022-07-11 ENCOUNTER — Non-Acute Institutional Stay: Payer: Medicare Other | Admitting: Student

## 2022-07-11 DIAGNOSIS — Z515 Encounter for palliative care: Secondary | ICD-10-CM

## 2022-07-11 DIAGNOSIS — F0150 Vascular dementia without behavioral disturbance: Secondary | ICD-10-CM | POA: Diagnosis not present

## 2022-07-12 NOTE — Progress Notes (Signed)
[facility] Pall[facility] Care Consult Note Telephone: [phone]  Fax: [phone]    Date of encounter: 07/11/22  PATIENT NAME: Karen Colon [address] [place] [address]   [phone] (home)  DOB: 08-26-1926 MRN: [id number] PRIMARY CARE PROVIDER:    Dewayne Shorter, MD,  [place] [id number] [phone]  REFERRING PROVIDER:   Dewayne Shorter, MD [place],  [place] [address] [phone]  RESPONSIBLE PARTY:    Contact Information     Name Relation Home Work [place]   Elaysia, Devargas Daughter   [phone]        I met face to face with patient in the facility. Palliative Care was asked to follow this patient by consultation request of  Dewayne Shorter, MD to address advance care planning and complex medical decision making. This is a follow up visit.                                   ASSESSMENT AND PLAN / RECOMMENDATIONS:   Advance Care Planning/Goals of Care: Goals include to maximize quality of life and symptom management. Patient/health care surrogate gave his/her permission to discuss. CODE STATUS: DNR  Symptom Management/Plan:  Vascular Dementia-patient has been stable; no recent changes or declines reported. Staff to reorient/redirect as needed.  Assist with ADLs as needed.  Monitor for falls/safety. Monitor for cognitive and functional declines.     Follow up Palliative Care Visit: Palliative care will continue to follow for complex medical decision making, advance care planning, and clarification of goals. Return in 10-12 weeks or prn.   This visit was coded based on medical decision making (MDM).  PPS: 40%  HOSPICE ELIGIBILITY/DIAGNOSIS: TBD  Chief Complaint: Palliative medicine follow-up visit.  HISTORY OF PRESENT ILLNESS:  Karen Colon is a 88 y.o. year old female  with Vascular dementia, hypothyroidism, osteoporosis, depression, atrial fibrillation, allergic rhinitis,  hx of pelvic fracture.      Patient resides at [facility]. Patient has been stable per facility staff . She denies having pain, shortness of breath, nausea or Constipation. She does have forgetfulness noted. Parteint is observed pedaling self in hallway; she did require redirection to her room today. No recent falls or injury reported. Her weight has been stable. No recent infections. No ED visits or hospitalizations.    History obtained from review of EMR, discussion with primary team, and interview with family, facility staff/caregiver and/or Ms. Muenchow.  I reviewed available labs, medications, imaging, studies and related documents from the EMR.  Records reviewed and summarized above.   ROS  A 10 point ROS is negative, except for the pertinent positives and negatives detailed per the HPI.     Physical Exam: Weight: 155.4 pounds Pulse 76, resp 16, b/p 128/80, sats 98% on room air Constitutional: NAD General: frail appearing, WNWD  EYES: anicteric sclera, lids intact, no discharge  ENMT: intact hearing, oral mucous membranes moist, dentition intact CV: S1S2, RRR, no LE edema Pulmonary: LCTA, no increased work of breathing, no cough, room air Abdomen: normo-active BS + 4 quadrants, soft and non tender, no ascites GU: deferred MSK: moves all extremities, w/c for locomotion Skin: warm and dry, no rashes or wounds on visible skin Neuro: + generalized weakness,  + cognitive impairment Psych: non-anxious affect, A and O x 2, forgetful Hem/lymph/immuno: no widespread bruising   Thank you for the opportunity to participate in  the care of Ms. Barse.  The palliative care team will continue to follow. Please call our office at [phone] if we can be of additional assistance.   Ezekiel Slocumb, NP   COVID-19 PATIENT SCREENING TOOL Asked and negative response unless otherwise noted:   Have you had symptoms of covid, tested positive or been in contact with someone with symptoms/positive test in the past 5-10  days? No

## 2022-08-10 ENCOUNTER — Encounter: Payer: Self-pay | Admitting: Student

## 2022-08-10 ENCOUNTER — Non-Acute Institutional Stay (SKILLED_NURSING_FACILITY): Payer: Medicare Other | Admitting: Student

## 2022-08-10 DIAGNOSIS — S329XXS Fracture of unspecified parts of lumbosacral spine and pelvis, sequela: Secondary | ICD-10-CM

## 2022-08-10 DIAGNOSIS — F0150 Vascular dementia without behavioral disturbance: Secondary | ICD-10-CM | POA: Diagnosis not present

## 2022-08-10 DIAGNOSIS — M810 Age-related osteoporosis without current pathological fracture: Secondary | ICD-10-CM | POA: Diagnosis not present

## 2022-08-10 DIAGNOSIS — R140 Abdominal distension (gaseous): Secondary | ICD-10-CM

## 2022-08-10 DIAGNOSIS — R109 Unspecified abdominal pain: Secondary | ICD-10-CM | POA: Diagnosis not present

## 2022-08-10 DIAGNOSIS — I480 Paroxysmal atrial fibrillation: Secondary | ICD-10-CM | POA: Diagnosis not present

## 2022-08-10 DIAGNOSIS — S329XXA Fracture of unspecified parts of lumbosacral spine and pelvis, initial encounter for closed fracture: Secondary | ICD-10-CM | POA: Insufficient documentation

## 2022-08-10 DIAGNOSIS — F5104 Psychophysiologic insomnia: Secondary | ICD-10-CM

## 2022-08-10 DIAGNOSIS — J301 Allergic rhinitis due to pollen: Secondary | ICD-10-CM

## 2022-08-10 DIAGNOSIS — E039 Hypothyroidism, unspecified: Secondary | ICD-10-CM

## 2022-08-10 NOTE — Progress Notes (Signed)
Location:  Other [facility] Room Number: [facility] [address] Place of Service:  SNF [phone]) Provider:  Dr. Sherri Rad, MD  Patient Care Team: Earnestine Mealing, MD as PCP - [facility])  Extended Emergency Contact Information Primary Emergency Contact: Shambhavi, Salley Mobile Phone: [phone] Relation: Daughter  Code Status:  DNR Goals of care: Advanced Directive information    08/10/2022   10:41 AM  Advanced Directives  Does Patient Have a Medical Advance Directive? Yes  Type of Advance Directive Out of facility DNR (pink MOST or yellow form)  Does patient want to make changes to medical advance directive? No - Patient declined     Chief Complaint  Patient presents with   Medical Management of Chronic Issues    Medical Management of Chronic Issues.     HPI:  Pt is a 88 y.o. female seen today for medical management of chronic diseases.    Ill be a lot better when you leave me alone "Ouch that hurts" when listening to cest and stomach.   Nursing states patient ate less at her meal today. She sleeps most of the day and gets up at times to have meals. Otherwise, minimal interaction with others.   Declined vital signs today.  Hx of pelvic fracture 07/04/2013 Rib fracture after fall and also incurred a concussion at that time. 04/18/2021  Past Medical History:  Diagnosis Date   Allergic rhinitis    Depressive disorder, not elsewhere classified    Hearing loss    bilateral hearing aides   History of MRI 9/10   Cardiac MRI, normal   Hyperlipidemia    Hypertension    Hypothyroidism    Insomnia, unspecified    Osteopenia    Paroxysmal A-fib (HCC) 2009   [facility]   Teeth grinding    Unspecified vitamin D deficiency    Past Surgical History:  Procedure Laterality Date   APPENDECTOMY  2009   TONSILLECTOMY      Allergies  Allergen Reactions   Ciprofloxacin     Muscle spasm    Iodinated Contrast Media     Iodine DYe    Other     Dust Mites    Outpatient Encounter Medications as of 08/10/2022  Medication Sig   aspirin EC 81 MG tablet Take 81 mg by mouth daily.   diclofenac Sodium (VOLTAREN) 1 % GEL Apply 4 g topically every 6 (six) hours as needed.   diltiazem (CARDIZEM CD) 120 MG 24 hr capsule Take 1 capsule (120 mg total) by mouth daily.   levothyroxine (SYNTHROID) 75 MCG tablet Take 75 mcg by mouth daily before breakfast.   psyllium (METAMUCIL) 58.6 % packet Take 1 packet by mouth daily.   [DISCONTINUED] acetaminophen (TYLENOL) 325 MG tablet Take 650 mg by mouth 2 (two) times daily.   [DISCONTINUED] acidophilus (RISAQUAD) CAPS capsule Take 1 capsule by mouth daily.   [DISCONTINUED] Ascorbic Acid (VITAMIN C) 1000 MG tablet Take 1,000 mg by mouth daily.   [DISCONTINUED] Cholecalciferol (VITAMIN D3) 50 MCG (2000 UT) TABS Take 1 tablet by mouth daily.   [DISCONTINUED] escitalopram (LEXAPRO) 10 MG tablet Take 10 mg by mouth daily.   [DISCONTINUED] loratadine (CLARITIN) 10 MG tablet Take 10 mg by mouth daily.   [DISCONTINUED] traZODone (DESYREL) 50 MG tablet Take 25 mg by mouth at bedtime.   No facility-administered encounter medications on file as of 08/10/2022.    Review of Systems  Unable to perform ROS: Dementia    Immunization  History  Administered Date(s) Administered   Influenza Split 06/09/2012   Influenza-Unspecified 07/10/2022   Zoster, Live 04/19/2006   Pertinent  Health Maintenance Due  Topic Date Due   DEXA SCAN  Never done   INFLUENZA VACCINE  Completed      12/25/2019    3:33 PM  Fall Risk  Patient Fall Risk Level High fall risk   Functional Status Survey:    There were no vitals filed for this visit. There is no height or weight on file to calculate BMI. Physical Exam Constitutional:      Comments: Lying flat in bed at low level. Keeps eyes closed throughout interaction  Cardiovascular:     Rate and Rhythm: Normal rate and regular rhythm.  Pulmonary:     Effort:  Pulmonary effort is normal.     Breath sounds: Normal breath sounds.  Abdominal:     Comments: Distended, tender to palpation.   Skin:    General: Skin is warm and dry.     Labs reviewed: Recent Labs    01/23/22 0000  NA 141  K 3.9  CL 103  CO2 31*  BUN 17  CREATININE 0.7  CALCIUM 9.5   Recent Labs    01/23/22 0000  AST 16  ALT 13  ALKPHOS 112  ALBUMIN 4.0   Recent Labs    01/23/22 0000  WBC 5.9  HGB 12.8  HCT 39  PLT 347   Lab Results  Component Value Date   TSH 0.012 (L) 11/14/2017   No results found for: "HGBA1C" No results found for: "CHOL", "HDL", "LDLCALC", "LDLDIRECT", "TRIG", "CHOLHDL"  Significant Diagnostic Results in last 30 days:  No results found.  Assessment/Plan Abdominal Distension  Unclear etiology. Discussed with nursing concern for an underlying etiology given her tenderness and poor PO intake. D/C psyllium and start daily Miralax.   2. Vascular dementia without behavioral disturbance (HCC) Difficult to fully examine patient given poor participation. Weight is stable. Orlene Erm is limited. Patient limits interactions with other residents and nursing. No medications for memory changes, however, continues   3. Closed displaced fracture of pelvis, unspecified part of pelvis, sequela 4. Age related osteoporosis, unspecified pathological fracture presence Patient with history of numerous fractures. Previously on treatment for osteoporosis discontinued in 2015. No longer indicated. Continues to have wheelchair dependence and support from nursing staff.   5. Psychophysiological insomnia No issue with sleeping at this time. CTM  6. Paroxysmal atrial fibrillation (HCC) HR well-controlled with cardizem. Not on anticoagulation. ASA 81 mg daily.   7. Hypothyroidism, unspecified type Continue Levothyroxine 75 mcg daily.   8. Allergic rhinitis due to pollen, unspecified seasonality No symptoms at this time CTM.     Family/ staff  Communication: Nursing  Labs/tests ordered:  KUB  Coralyn Helling, MD, [facility] [phone]

## 2022-08-31 ENCOUNTER — Other Ambulatory Visit: Payer: Self-pay

## 2022-08-31 ENCOUNTER — Emergency Department: Payer: Medicare Other

## 2022-08-31 ENCOUNTER — Inpatient Hospital Stay
Admission: EM | Admit: 2022-08-31 | Discharge: 2022-09-05 | DRG: 521 | Disposition: A | Discharge status: Hospice | Payer: Medicare Other | Attending: Internal Medicine | Admitting: Internal Medicine

## 2022-08-31 DIAGNOSIS — Z79890 Hormone replacement therapy (postmenopausal): Secondary | ICD-10-CM

## 2022-08-31 DIAGNOSIS — E785 Hyperlipidemia, unspecified: Secondary | ICD-10-CM | POA: Diagnosis present

## 2022-08-31 DIAGNOSIS — S72032A Displaced midcervical fracture of left femur, initial encounter for closed fracture: Principal | ICD-10-CM | POA: Diagnosis present

## 2022-08-31 DIAGNOSIS — Z7982 Long term (current) use of aspirin: Secondary | ICD-10-CM

## 2022-08-31 DIAGNOSIS — Y92129 Unspecified place in nursing home as the place of occurrence of the external cause: Secondary | ICD-10-CM

## 2022-08-31 DIAGNOSIS — J9601 Acute respiratory failure with hypoxia: Secondary | ICD-10-CM | POA: Diagnosis not present

## 2022-08-31 DIAGNOSIS — Z515 Encounter for palliative care: Secondary | ICD-10-CM | POA: Diagnosis not present

## 2022-08-31 DIAGNOSIS — Z888 Allergy status to other drugs, medicaments and biological substances status: Secondary | ICD-10-CM | POA: Diagnosis not present

## 2022-08-31 DIAGNOSIS — Z66 Do not resuscitate: Secondary | ICD-10-CM | POA: Diagnosis present

## 2022-08-31 DIAGNOSIS — Z974 Presence of external hearing-aid: Secondary | ICD-10-CM

## 2022-08-31 DIAGNOSIS — H9190 Unspecified hearing loss, unspecified ear: Secondary | ICD-10-CM | POA: Diagnosis present

## 2022-08-31 DIAGNOSIS — I480 Paroxysmal atrial fibrillation: Secondary | ICD-10-CM | POA: Diagnosis present

## 2022-08-31 DIAGNOSIS — Z7189 Other specified counseling: Secondary | ICD-10-CM | POA: Diagnosis not present

## 2022-08-31 DIAGNOSIS — I10 Essential (primary) hypertension: Secondary | ICD-10-CM | POA: Diagnosis not present

## 2022-08-31 DIAGNOSIS — Z993 Dependence on wheelchair: Secondary | ICD-10-CM

## 2022-08-31 DIAGNOSIS — F02C0 Dementia in other diseases classified elsewhere, severe, without behavioral disturbance, psychotic disturbance, mood disturbance, and anxiety: Secondary | ICD-10-CM | POA: Diagnosis present

## 2022-08-31 DIAGNOSIS — Z91041 Radiographic dye allergy status: Secondary | ICD-10-CM

## 2022-08-31 DIAGNOSIS — F0150 Vascular dementia without behavioral disturbance: Secondary | ICD-10-CM | POA: Diagnosis present

## 2022-08-31 DIAGNOSIS — G309 Alzheimer's disease, unspecified: Secondary | ICD-10-CM | POA: Diagnosis present

## 2022-08-31 DIAGNOSIS — F01C0 Vascular dementia, severe, without behavioral disturbance, psychotic disturbance, mood disturbance, and anxiety: Secondary | ICD-10-CM | POA: Diagnosis present

## 2022-08-31 DIAGNOSIS — S72012A Unspecified intracapsular fracture of left femur, initial encounter for closed fracture: Secondary | ICD-10-CM | POA: Diagnosis present

## 2022-08-31 DIAGNOSIS — Z8249 Family history of ischemic heart disease and other diseases of the circulatory system: Secondary | ICD-10-CM | POA: Diagnosis not present

## 2022-08-31 DIAGNOSIS — S72002A Fracture of unspecified part of neck of left femur, initial encounter for closed fracture: Secondary | ICD-10-CM | POA: Diagnosis present

## 2022-08-31 DIAGNOSIS — N179 Acute kidney failure, unspecified: Secondary | ICD-10-CM | POA: Diagnosis not present

## 2022-08-31 DIAGNOSIS — W19XXXA Unspecified fall, initial encounter: Secondary | ICD-10-CM | POA: Diagnosis present

## 2022-08-31 DIAGNOSIS — I898 Other specified noninfective disorders of lymphatic vessels and lymph nodes: Secondary | ICD-10-CM | POA: Diagnosis not present

## 2022-08-31 DIAGNOSIS — I700 Atherosclerosis of aorta: Secondary | ICD-10-CM | POA: Diagnosis not present

## 2022-08-31 DIAGNOSIS — G4700 Insomnia, unspecified: Secondary | ICD-10-CM | POA: Diagnosis present

## 2022-08-31 DIAGNOSIS — Z79899 Other long term (current) drug therapy: Secondary | ICD-10-CM | POA: Diagnosis not present

## 2022-08-31 DIAGNOSIS — E039 Hypothyroidism, unspecified: Secondary | ICD-10-CM | POA: Diagnosis not present

## 2022-08-31 DIAGNOSIS — Z9181 History of falling: Secondary | ICD-10-CM | POA: Diagnosis not present

## 2022-08-31 DIAGNOSIS — R918 Other nonspecific abnormal finding of lung field: Secondary | ICD-10-CM | POA: Diagnosis not present

## 2022-08-31 DIAGNOSIS — F32A Depression, unspecified: Secondary | ICD-10-CM | POA: Diagnosis not present

## 2022-08-31 DIAGNOSIS — S72009A Fracture of unspecified part of neck of unspecified femur, initial encounter for closed fracture: Secondary | ICD-10-CM | POA: Diagnosis present

## 2022-08-31 LAB — CBC WITH DIFFERENTIAL/PLATELET
Abs Immature Granulocytes: 0.1 10*3/uL — ABNORMAL HIGH (ref 0.00–0.07)
Basophils Absolute: 0 10*3/uL (ref 0.0–0.1)
Basophils Relative: 0 %
Eosinophils Absolute: 0.1 10*3/uL (ref 0.0–0.5)
Eosinophils Relative: 2 %
HCT: 40 % (ref 36.0–46.0)
Hemoglobin: 12.7 g/dL (ref 12.0–15.0)
Immature Granulocytes: 1 %
Lymphocytes Relative: 18 %
Lymphs Abs: 1.5 10*3/uL (ref 0.7–4.0)
MCH: 28.9 pg (ref 26.0–34.0)
MCHC: 31.8 g/dL (ref 30.0–36.0)
MCV: 90.9 fL (ref 80.0–100.0)
Monocytes Absolute: 0.4 10*3/uL (ref 0.1–1.0)
Monocytes Relative: 5 %
Neutro Abs: 6.1 10*3/uL (ref 1.7–7.7)
Neutrophils Relative %: 74 %
Platelets: 327 10*3/uL (ref 150–400)
RBC: 4.4 MIL/uL (ref 3.87–5.11)
RDW: 14.7 % (ref 11.5–15.5)
WBC: 8.2 10*3/uL (ref 4.0–10.5)
nRBC: 0 % (ref 0.0–0.2)

## 2022-08-31 LAB — COMPREHENSIVE METABOLIC PANEL
ALT: 16 U/L (ref 0–44)
AST: 19 U/L (ref 15–41)
Albumin: 3.5 g/dL (ref 3.5–5.0)
Alkaline Phosphatase: 91 U/L (ref 38–126)
Anion gap: 7 (ref 5–15)
BUN: 19 mg/dL (ref 8–23)
CO2: 26 mmol/L (ref 22–32)
Calcium: 9.1 mg/dL (ref 8.9–10.3)
Chloride: 110 mmol/L (ref 98–111)
Creatinine, Ser: 0.63 mg/dL (ref 0.44–1.00)
GFR, Estimated: >60 mL/min (ref >60)
Glucose, Bld: 97 mg/dL (ref 70–99)
Potassium: 3.7 mmol/L (ref 3.5–5.1)
Sodium: 143 mmol/L (ref 135–145)
Total Bilirubin: 0.5 mg/dL (ref 0.3–1.2)
Total Protein: 6.2 g/dL — ABNORMAL LOW (ref 6.5–8.1)

## 2022-08-31 MED ORDER — MORPHINE SULFATE (PF) 4 MG/ML IV SOLN
4.0000 mg | Freq: Once | INTRAVENOUS | Status: AC
  Administered 2022-08-31: 4 mg via INTRAVENOUS
  Filled 2022-08-31: qty 1

## 2022-08-31 MED ORDER — HYDROCODONE-ACETAMINOPHEN 5-325 MG PO TABS
1.0000 | ORAL_TABLET | Freq: Four times a day (QID) | ORAL | Status: DC | PRN
  Administered 2022-08-31: 1 via ORAL
  Filled 2022-08-31: qty 1

## 2022-08-31 MED ORDER — POLYETHYLENE GLYCOL 3350 17 G PO PACK
17.0000 g | PACK | Freq: Every day | ORAL | Status: DC
  Administered 2022-09-03: 17 g via ORAL
  Filled 2022-08-31: qty 1

## 2022-08-31 MED ORDER — ONDANSETRON HCL 4 MG/2ML IJ SOLN
4.0000 mg | Freq: Once | INTRAMUSCULAR | Status: AC
  Administered 2022-08-31: 4 mg via INTRAVENOUS
  Filled 2022-08-31: qty 2

## 2022-08-31 MED ORDER — HALOPERIDOL LACTATE 5 MG/ML IJ SOLN
1.0000 mg | Freq: Four times a day (QID) | INTRAMUSCULAR | Status: DC | PRN
  Administered 2022-09-01: 1 mg via INTRAVENOUS
  Filled 2022-08-31: qty 1

## 2022-08-31 MED ORDER — LEVOTHYROXINE SODIUM 50 MCG PO TABS
75.0000 ug | ORAL_TABLET | Freq: Every day | ORAL | Status: DC
  Filled 2022-08-31: qty 2

## 2022-08-31 MED ORDER — CEFAZOLIN SODIUM-DEXTROSE 2-4 GM/100ML-% IV SOLN
2.0000 g | INTRAVENOUS | Status: AC
  Administered 2022-09-01: 2 g via INTRAVENOUS

## 2022-08-31 MED ORDER — DILTIAZEM HCL ER COATED BEADS 120 MG PO CP24
120.0000 mg | ORAL_CAPSULE | Freq: Every day | ORAL | Status: DC
  Administered 2022-08-31: 120 mg via ORAL
  Filled 2022-08-31 (×5): qty 1

## 2022-08-31 MED ORDER — MORPHINE SULFATE (PF) 2 MG/ML IV SOLN
0.5000 mg | INTRAVENOUS | Status: DC | PRN
  Administered 2022-08-31 – 2022-09-01 (×5): 0.5 mg via INTRAVENOUS
  Filled 2022-08-31 (×6): qty 1

## 2022-08-31 MED ORDER — PSYLLIUM 58.6 % PO PACK
1.0000 | PACK | Freq: Every day | ORAL | Status: DC
  Filled 2022-08-31: qty 1

## 2022-08-31 MED ORDER — METHOCARBAMOL 1000 MG/10ML IJ SOLN: 500.0000 mg | Freq: Four times a day (QID) | INTRAVENOUS | Status: DC | PRN

## 2022-08-31 MED ORDER — SENNA 8.6 MG PO TABS: 1.0000 | ORAL_TABLET | Freq: Two times a day (BID) | ORAL | Status: DC

## 2022-08-31 MED ORDER — PSYLLIUM 95 % PO PACK
1.0000 | PACK | Freq: Every day | ORAL | Status: DC
  Administered 2022-09-03: 1 via ORAL
  Filled 2022-08-31 (×5): qty 1

## 2022-08-31 MED ORDER — METHOCARBAMOL 500 MG PO TABS: 500.0000 mg | ORAL_TABLET | Freq: Four times a day (QID) | ORAL | Status: DC | PRN

## 2022-08-31 NOTE — ED Notes (Signed)
MD Agbata aware of continued HR above 100. Will continue to track and give meds as ordered.

## 2022-08-31 NOTE — ED Notes (Addendum)
Pts daughter refuses to have no monitoring or spo2 reading while here due to pt being DNR.

## 2022-08-31 NOTE — ED Notes (Addendum)
This RN at bedside,  pt repeatedly stating "please let me die".  2 family members at bedside.  Attempted to attach spo2 and explained reasoning to daughter. Daughter refuses monitoring spo2 levels MD made aware.

## 2022-08-31 NOTE — ED Provider Notes (Signed)
[facility] Provider Note   Event Date/Time   First MD Initiated Contact with Patient 08/31/22 [phone]     (approximate) History  Fall  HPI Karen Colon is a 88 y.o. female with a past medical history of Alzheimer's dementia, hypothyroidism, paroxysmal atrial fibrillation, and hypertension who presents from her long-term care facility after an unwitnessed fall.  Patient has since been complaining of pain to the left leg.  Unknown whether patient had any head trauma or loss of consciousness.  EMS state that they did not know whether patient was on blood thinners or not.  There is no medication list that arrived with the patient.  Per the EMR med list as well as most recent clinic visit, patient is not on any anticoagulation medications at this time.  Patient is laying in bed in mild distress secondary to pain and voicing "stop" but will not explain what is hurting or where.  Other history and review of systems are unable to be obtained at this time.   Physical Exam  Triage Vital Signs: ED Triage Vitals  Enc Vitals Group     BP 08/31/22 0813 (!) 154/75     Pulse Rate 08/31/22 0812 85     Resp 08/31/22 0812 18     Temp 08/31/22 0812 97.6 F (36.4 C)     Temp Source 08/31/22 0812 Oral     SpO2 08/31/22 0805 94 %     Weight 08/31/22 0810 162 lb 11.2 oz (73.8 kg)     Height 08/31/22 0810 5\' 4"  (1.626 m)     Head Circumference --      Peak Flow --      Pain Score --      Pain Loc --      Pain Edu? --      Excl. in GC? --    Most recent vital signs: Vitals:   08/31/22 1515 08/31/22 1530  BP:    Pulse: (!) 112 (!) 112  Resp: 11 12  Temp:    SpO2: 95% 95%   General: Awake, agitated CV:  Good peripheral perfusion.  Resp:  Normal effort.  Abd:  No distention.  Other:  Elderly Caucasian female laying in bed in mild distress secondary to pain ED Results / Procedures / Treatments  Labs (all labs ordered are listed, but only abnormal results are displayed) Labs  Reviewed  COMPREHENSIVE METABOLIC PANEL - Abnormal; Notable for the following components:      Result Value   Total Protein 6.2 (*)    All other components within normal limits  CBC WITH DIFFERENTIAL/PLATELET - Abnormal; Notable for the following components:   Abs Immature Granulocytes 0.10 (*)    All other components within normal limits  CBC WITH DIFFERENTIAL/PLATELET   EKG ED ECG REPORT I, 14/08/23, the attending physician, personally viewed and interpreted this ECG. Date: 08/31/2022 EKG Time: 0814 Rate: 85 Rhythm: normal sinus rhythm QRS Axis: normal Intervals: normal ST/T Wave abnormalities: normal Narrative Interpretation: no evidence of acute ischemia RADIOLOGY ED MD interpretation: X-ray of the left hip and pelvis interpreted by me shows an acute, displaced, impacted subcapital/transcervical left femoral neck fracture  One-view portable chest x-ray interpreted by me shows no evidence of acute abnormalities including no pneumonia, pneumothorax, or widened mediastinum -Agree with radiology assessment Official radiology report(s): DG HIP UNILAT WITH PELVIS 2-3 VIEWS LEFT  Result Date: 08/31/2022 CLINICAL DATA:  Provided history: Fall.  Unwitnessed fall. EXAM: DG HIP (WITH OR WITHOUT  PELVIS) 2-3V LEFT COMPARISON:  Radiographs of the left hip 06/18/2010. FINDINGS: Acute, displaced and impacted subcapital/transcervical left femoral neck fracture. Chronic fracture deformities of the left superior and inferior pubic rami. Lumbar spondylosis at the imaged levels. IMPRESSION: Acute, displaced and impacted subcapital/transcervical left femoral neck fracture. Electronically Signed   By: Jackey Loge D.O.   On: 08/31/2022 09:09   DG Chest Port 1 View  Result Date: 08/31/2022 CLINICAL DATA:  Provided history: Hip fracture.  Unwitnessed fall. EXAM: PORTABLE CHEST 1 VIEW COMPARISON:  Chest radiographs 11/15/2017 and earlier. FINDINGS: Heart size within normal limits. Aortic  atherosclerosis. Calcified mediastinal/hilar lymph nodes. A 9 mm somewhat nodular opacity projects in the region of the left lung base. Elsewhere, there is no appreciable airspace consolidation. No evidence of pleural effusion or pneumothorax. No acute bony abnormality identified. IMPRESSION: 9 mm somewhat nodular opacity projecting in the region of the left lung base, new from the prior examination of 11/15/2017. This is concerning for a possible pulmonary nodule, and a chest CT is recommended for further evaluation. Aortic Atherosclerosis (ICD10-I70.0). Calcified mediastinal/hilar lymph nodes, suggesting prior granulomatous disease. Electronically Signed   By: Jackey Loge D.O.   On: 08/31/2022 09:00   PROCEDURES: Critical Care performed: No Procedures MEDICATIONS ORDERED IN ED: Medications  haloperidol lactate (HALDOL) injection 1 mg (has no administration in time range)  diltiazem (CARDIZEM CD) 24 hr capsule 120 mg (120 mg Oral Given 08/31/22 1429)  levothyroxine (SYNTHROID) tablet 75 mcg (has no administration in time range)  polyethylene glycol (MIRALAX / GLYCOLAX) packet 17 g (has no administration in time range)  HYDROcodone-acetaminophen (NORCO/VICODIN) 5-325 MG per tablet 1-2 tablet (1 tablet Oral Given 08/31/22 1430)  morphine (PF) 2 MG/ML injection 0.5 mg (has no administration in time range)  methocarbamol (ROBAXIN) tablet 500 mg (has no administration in time range)    Or  methocarbamol (ROBAXIN) 500 mg in dextrose 5 % 50 mL IVPB (has no administration in time range)  senna (SENOKOT) tablet 8.6 mg (has no administration in time range)  psyllium (HYDROCIL/METAMUCIL) 1 packet (has no administration in time range)  morphine (PF) 4 MG/ML injection 4 mg (4 mg Intravenous Given 08/31/22 0827)  ondansetron (ZOFRAN) injection 4 mg (4 mg Intravenous Given 08/31/22 0826)  morphine (PF) 4 MG/ML injection 4 mg (4 mg Intravenous Given 08/31/22 0957)  morphine (PF) 4 MG/ML injection 4 mg (4 mg  Intravenous Given 08/31/22 1229)   IMPRESSION / MDM / ASSESSMENT AND PLAN / ED COURSE  I reviewed the triage vital signs and the nursing notes.                             The patient is on the cardiac monitor to evaluate for evidence of arrhythmia and/or significant heart rate changes. Patient's presentation is most consistent with acute presentation with potential threat to life or bodily function. Patient is a 88 year old female that presents for left hip pain Workup: XR hip Findings: Left femoral neck fracture without dislocation Consult: Orthopedic Surgery (query fascia iliacus block vs continued opiate pain control), hospitalist  Patient does not currently demonstrate complications of fracture such as compartment syndrome, arterial or nerve injury.  Interventions: analgesia Disposition: Admit   FINAL CLINICAL IMPRESSION(S) / ED DIAGNOSES   Final diagnoses:  Fall, initial encounter  Closed displaced fracture of left femoral neck (HCC)   Rx / DC Orders   ED Discharge Orders     None  Note:  This document was prepared using Dragon voice recognition software and may include unintentional dictation errors.   Merwyn Katos, MD 08/31/22 [phone]

## 2022-08-31 NOTE — Progress Notes (Signed)
[facility]) Hospice hospital liaison note  Patient's daughter reached out about hospice services and contact was made from the ED staff. Talked with daughter by telephone and she relayed her wishes for hospice to follow at [facility] after discharge. Patient is scheduled for surgical hip repair for tomorrow morning.   Liaison will follow peripherally and assist with d/c planning when appropriate.   Please do not hesitate to call with any hospice related questions or concerns.   Thank you for the opportunity to participate in this patient's care.  Thea Gist, Charity fundraiser, [facility] Liaison  [phone]

## 2022-08-31 NOTE — Progress Notes (Signed)
       CROSS COVER NOTE  NAME: Karen Colon MRN: [id number] DOB : 1925/11/23  HPI/Events of Note   Acute urinary retention with plans for hip repair in am  Assessment and  Interventions   Assessment:  Plan: Place foley - remove post op day 1 - attempted to call daughter to inform, no answer       Donnie Mesa NP [facility]

## 2022-08-31 NOTE — Assessment & Plan Note (Signed)
Continue Synthroid °

## 2022-08-31 NOTE — Assessment & Plan Note (Signed)
Status post unwitnessed fall with a left femoral neck fracture Treatment as outlined in 1

## 2022-08-31 NOTE — ED Notes (Signed)
+   pulses left foot.

## 2022-08-31 NOTE — Assessment & Plan Note (Signed)
Patient with a history of advanced dementia and is wheelchair-bound Supportive care

## 2022-08-31 NOTE — Consult Note (Signed)
ORTHOPAEDIC CONSULTATION  REQUESTING PHYSICIAN: Lucile Shutters, MD  Chief Complaint:   Left hip pain  History of Present Illness: Karen Colon is a 88 y.o. female with multiple medical problems including hypertension, hyperlipidemia, hypothyroidism, paroxysmal atrial fibrillation, and severe dementia who lives in a nursing home.  Apparently, the patient sustained an unwitnessed fall earlier this morning, possibly trying to get up to go to the bathroom, and complains of left hip pain.  She is unable to bear weight.  She was brought to the emergency room where x-rays have demonstrated a displaced left femoral neck fracture.  The patient is being admitted this time for pain control and definitive management of this injury.  The patient is a poor historian and so cannot state whether there were any predisposing symptoms causing her to fall.  Past Medical History:  Diagnosis Date   Allergic rhinitis    Depressive disorder, not elsewhere classified    Hearing loss    bilateral hearing aides   History of MRI 9/10   Cardiac MRI, normal   Hyperlipidemia    Hypertension    Hypothyroidism    Insomnia, unspecified    Osteopenia    Paroxysmal A-fib (HCC) 2009   [facility]   Teeth grinding    Unspecified vitamin D deficiency    Past Surgical History:  Procedure Laterality Date   APPENDECTOMY  2009   TONSILLECTOMY     Social History   Socioeconomic History   Marital status: Married    Spouse name: Not on file   Number of children: 2   Years of education: Not on file   Highest education level: Not on file  Occupational History   Occupation: High school teacher    Comment: Retired  Tobacco Use   Smoking status: Never   Smokeless tobacco: Never  Substance and Sexual Activity   Alcohol use: Yes    Alcohol/week: 1.0 standard drink of alcohol    Types: 1 Glasses of wine per week    Comment: 1 glass of wine per night   Drug  use: No   Sexual activity: Not on file  Other Topics Concern   Not on file  Social History Narrative   Son and daughter      Has living will   Daughter is health care POA   Has DNR ---confirmed and rewritten 02/07/17   No tube    [employer]   Social Determinants of Health   Financial Resource Strain: Not on file  Food Insecurity: Not on file  Transportation Needs: Not on file  Physical Activity: Not on file  Stress: Not on file  Social Connections: Not on file   Family History  Problem Relation Age of Onset   Heart disease Mother    Pneumonia Father    Allergies  Allergen Reactions   Ciprofloxacin     Muscle spasm    Iodinated Contrast Media     Iodine DYe   Other     Dust Mites   Prior to Admission medications   Medication Sig Start Date End Date Taking? Authorizing Provider  aspirin 81 MG chewable tablet Chew 81 mg by mouth daily.   Yes [provider]  diltiazem (CARDIZEM CD) 120 MG 24 hr capsule Take 1 capsule (120 mg total) by mouth daily. 11/16/17  Yes Auburn Bilberry, MD  levothyroxine (SYNTHROID) 75 MCG tablet Take 75 mcg by mouth daily before breakfast.   Yes [provider]  polyethylene glycol (MIRALAX / GLYCOLAX) 17 g packet  Take 17 g by mouth daily.   Yes [provider]  diclofenac Sodium (VOLTAREN) 1 % GEL Apply 4 g topically every 6 (six) hours as needed.    [provider]  psyllium (METAMUCIL) 58.6 % packet Take 1 packet by mouth daily.    [provider]   DG HIP UNILAT WITH PELVIS 2-3 VIEWS LEFT  Result Date: 08/31/2022 CLINICAL DATA:  Provided history: Fall.  Unwitnessed fall. EXAM: DG HIP (WITH OR WITHOUT PELVIS) 2-3V LEFT COMPARISON:  Radiographs of the left hip 06/18/2010. FINDINGS: Acute, displaced and impacted subcapital/transcervical left femoral neck fracture. Chronic fracture deformities of the left superior and inferior pubic rami. Lumbar spondylosis at the imaged levels. IMPRESSION: Acute,  displaced and impacted subcapital/transcervical left femoral neck fracture. Electronically Signed   By: Jackey Loge D.O.   On: 08/31/2022 09:09   DG Chest Port 1 View  Result Date: 08/31/2022 CLINICAL DATA:  Provided history: Hip fracture.  Unwitnessed fall. EXAM: PORTABLE CHEST 1 VIEW COMPARISON:  Chest radiographs 11/15/2017 and earlier. FINDINGS: Heart size within normal limits. Aortic atherosclerosis. Calcified mediastinal/hilar lymph nodes. A 9 mm somewhat nodular opacity projects in the region of the left lung base. Elsewhere, there is no appreciable airspace consolidation. No evidence of pleural effusion or pneumothorax. No acute bony abnormality identified. IMPRESSION: 9 mm somewhat nodular opacity projecting in the region of the left lung base, new from the prior examination of 11/15/2017. This is concerning for a possible pulmonary nodule, and a chest CT is recommended for further evaluation. Aortic Atherosclerosis (ICD10-I70.0). Calcified mediastinal/hilar lymph nodes, suggesting prior granulomatous disease. Electronically Signed   By: Jackey Loge D.O.   On: 08/31/2022 09:00    Positive ROS: All other systems have been reviewed and were otherwise negative with the exception of those mentioned in the HPI and as above.  Physical Exam: General: Arousable and in moderate distress Psychiatric:  Patient is not competent for consent  Cardiovascular:  No pedal edema Respiratory:  No wheezing, non-labored breathing GI:  Abdomen is soft and non-tender Skin:  No lesions in the area of chief complaint Neurologic:  Sensation intact distally Lymphatic:  No axillary or cervical lymphadenopathy  Orthopedic Exam:  Orthopedic examination is limited to the left hip and lower extremity.  Left lower extremity is somewhat shortened and externally rotated as compared to the right.  Skin inspection around the left hip is unremarkable.  No swelling, erythema, ecchymosis, abrasions, or other skin abnormalities  are identified.  She has mild-moderate pain to palpation over the lateral aspect of the hip.  She has more severe pain with any attempted active or passive motion of the hip.  She is neurovascularly intact to the left lower extremity and foot.  X-rays:  X-rays of the pelvis and left hip are available for review and have been reviewed by myself.  These films demonstrate a displaced left femoral neck fracture.  No significant degenerative changes of the hip joint are noted.  No lytic lesions or other acute bony abnormalities are identified.  Assessment: Displaced left femoral neck fracture.  Plan: The treatment options, including both surgical and nonsurgical choices, have been discussed in detail with the patient and her family.  The family would like to proceed with surgical intervention in order to optimize patient comfort, specifically a left hip hemiarthroplasty.  The risks (including bleeding, infection, nerve and/or blood vessel injury, persistent or recurrent pain, loosening or failure of the components, leg length inequality, dislocation, need for further surgery, blood clots,  strokes, heart attacks or arrhythmias, pneumonia, etc.) and benefits of the surgical procedure were discussed.  The patient states his/her understanding and agrees to proceed.  A formal written consent will be obtained by the nursing staff.  Thank you for asking me to participate in the care of this most unfortunate woman.  I will be happy to follow her with you.   Maryagnes Amos, MD  Beeper #:  [phone]  08/31/2022 5:37 PM

## 2022-08-31 NOTE — ED Notes (Signed)
Pt continues to decline meal, blow by oxygen for pt as she rest, continues to remove [place].

## 2022-08-31 NOTE — Anesthesia Preprocedure Evaluation (Addendum)
Anesthesia Evaluation  Patient identified by MRN, date of birth, ID bandGeneral Assessment Comment:Pt sleeping due to morphine; only oriented to self at baseline per daughter  Reviewed: Allergy & Precautions, NPO status , Patient's Chart, lab work & pertinent test results  History of Anesthesia Complications Negative for: history of anesthetic complications  Airway Mallampati: Unable to assess   Neck ROM: Full    Dental no notable dental hx.    Pulmonary neg pulmonary ROS   Pulmonary exam normal breath sounds clear to auscultation       Cardiovascular hypertension, + dysrhythmias (a fib)  Rhythm:Irregular Rate:Normal  ECG 08/01/22:  Sinus rhythm Right axis deviation Low voltage, precordial leads Borderline T abnormalities, anterior leads   Neuro/Psych  PSYCHIATRIC DISORDERS  Depression   Dementia HOH; wheelchair bound at baseline    GI/Hepatic negative GI ROS,,,  Endo/Other  Hypothyroidism    Renal/GU negative Renal ROS     Musculoskeletal   Abdominal   Peds  Hematology  (+) REFUSES BLOOD PRODUCTS  Anesthesia Other Findings   Reproductive/Obstetrics                             Anesthesia Physical Anesthesia Plan  ASA: 3  Anesthesia Plan: General   Post-op Pain Management:    Induction: Intravenous  PONV Risk Score and Plan: 2 and Ondansetron, Dexamethasone and Treatment may vary due to age or medical condition  Airway Management Planned: Oral ETT  Additional Equipment:   Intra-op Plan:   Post-operative Plan: Extubation in OR  Informed Consent: I have reviewed the patients History and Physical, chart, labs and discussed the procedure including the risks, benefits and alternatives for the proposed anesthesia with the patient or authorized representative who has indicated his/her understanding and acceptance.   Patient has DNR.  Discussed DNR with power of attorney and Continue  DNR.   Dental advisory given and Consent reviewed with POA  Plan Discussed with: CRNA  Anesthesia Plan Comments: (Patient's daughter at bedside consented for risks of anesthesia including but not limited to:  - adverse reactions to medications - damage to eyes, teeth, lips or other oral mucosa - nerve damage due to positioning  - sore throat or hoarseness - damage to heart, brain, nerves, lungs, other parts of body or loss of life  Informed patient's daughter about role of CRNA in peri- and intra-operative care; she voiced understanding.  Discussed blood products and DNR extensively with patient's daughter.  She is clear that the patient would not want to receive any type of blood product, including albumin.  The patient's daughter is also very clear about wanting to extend the DNR peri- and intra-operatively.  The patient would not want medications to restart the heart, chest compressions, or shocks.  I agreed and said we would comply with both her and the patient's wishes.)       Anesthesia Quick Evaluation

## 2022-08-31 NOTE — ED Notes (Signed)
Family no consents to cardiac, pulse ox, and manual b/p readings. O2 reading 80% on room air, family states ok to apply [place].  3L [place] applied, rate now 94%. Family remains at bedside, pt resting with eyes closed breathing e/u.

## 2022-08-31 NOTE — H&P (Addendum)
History and Physical    Patient: Karen Colon QQI:[id number] DOB: 04/13/1926 DOA: 08/31/2022 DOS: the patient was seen and examined on 08/31/2022 PCP: Earnestine Mealing, MD  Patient coming from: SNF  Chief Complaint:  Chief Complaint  Patient presents with   Fall   Most of the history obtained from EMR and patient's daughter at the bedside HPI: Karen Colon is a 88 y.o. female with medical history significant for advanced dementia, nonambulatory (wheelchair-bound ), hypertension, hypothyroidism, depression, hearing loss who was brought into the ER by EMS for evaluation following an unwitnessed fall at [facility].  Patient was said to have been found on the floor in the bathroom.  She had complaints of left leg pain and her left leg was noted to be shortened and outwardly rotated. During my evaluation patient appears comfortable and is resting quietly.  She had received IV morphine for pain control. Left hip x-ray showed an acute, displaced and impacted subcapital/transcervical left femoral neck fracture Unable to do review of systems on this patient due to her underlying dementia Daughter and granddaughter at the bedside have a lot of questions and states that patient has a DNR as well as a MOST form and would not want any form of intervention.  Patient's daughter is indecisive about surgical repair and will want to speak to the palliative care team.   Review of Systems: unable to review all systems due to the inability of the patient to answer questions. Past Medical History:  Diagnosis Date   Allergic rhinitis    Depressive disorder, not elsewhere classified    Hearing loss    bilateral hearing aides   History of MRI 9/10   Cardiac MRI, normal   Hyperlipidemia    Hypertension    Hypothyroidism    Insomnia, unspecified    Osteopenia    Paroxysmal A-fib (HCC) 2009   [facility]   Teeth grinding    Unspecified vitamin D deficiency    Past Surgical History:  Procedure Laterality Date    APPENDECTOMY  2009   TONSILLECTOMY     Social History:  reports that she has never smoked. She has never used smokeless tobacco. She reports current alcohol use of about 1.0 standard drink of alcohol per week. She reports that she does not use drugs.  Allergies  Allergen Reactions   Ciprofloxacin     Muscle spasm    Iodinated Contrast Media     Iodine DYe   Other     Dust Mites    Family History  Problem Relation Age of Onset   Heart disease Mother    Pneumonia Father     Prior to Admission medications   Medication Sig Start Date End Date Taking? Authorizing Provider  aspirin 81 MG chewable tablet Chew 81 mg by mouth daily.   Yes [provider]  diltiazem (CARDIZEM CD) 120 MG 24 hr capsule Take 1 capsule (120 mg total) by mouth daily. 11/16/17  Yes Auburn Bilberry, MD  levothyroxine (SYNTHROID) 75 MCG tablet Take 75 mcg by mouth daily before breakfast.   Yes [provider]  polyethylene glycol (MIRALAX / GLYCOLAX) 17 g packet Take 17 g by mouth daily.   Yes [provider]  diclofenac Sodium (VOLTAREN) 1 % GEL Apply 4 g topically every 6 (six) hours as needed.    [provider]  psyllium (METAMUCIL) 58.6 % packet Take 1 packet by mouth daily.    [provider]    Physical Exam:  Physical Exam Vitals  and nursing note reviewed.  Constitutional:      Comments: Sleeping and appears comfortable  HENT:     Head: Normocephalic and atraumatic.     Nose: Nose normal.     Mouth/Throat:     Mouth: Mucous membranes are moist.  Eyes:     Conjunctiva/sclera: Conjunctivae normal.  Cardiovascular:     Rate and Rhythm: Tachycardia present.  Pulmonary:     Effort: Pulmonary effort is normal.     Breath sounds: Normal breath sounds.  Abdominal:     General: Abdomen is flat. Bowel sounds are normal.     Palpations: Abdomen is soft.  Musculoskeletal:     Cervical back: Normal range of motion and neck supple.     Comments: Decreased  range of motion left hip, left leg is shortened and externally rotated  Skin:    General: Skin is warm and dry.  Neurological:     Comments: Unable to assess  Psychiatric:     Comments: Unable to assess     Data Reviewed: Relevant notes from primary care and specialist visits, past discharge summaries as available in EHR, including Care Everywhere. Prior diagnostic testing as pertinent to current admission diagnoses Updated medications and problem lists for reconciliation ED course, including vitals, labs, imaging, treatment and response to treatment Triage notes, nursing and pharmacy notes and ED provider's notes Notable results as noted in HPI Labs reviewed.  Sodium 143, potassium 3.7, chloride 110, bicarb 26, glucose 97, BUN 19, creatinine 0.63, calcium 9.1, total protein 6.2, albumin 3.5, AST 19, ALT 16, alkaline phosphatase 91, total bilirubin 0.5, white count 8.2, hemoglobin 12.7, hematocrit 40, platelet count 327 Chest x-ray reviewed by me shows 9 mm somewhat nodular opacity projecting in the region of the left lung base, new from the prior examination of 11/15/2017. This is concerning for a possible pulmonary nodule, and a chest CT is recommended for further evaluation. Aortic Atherosclerosis. Calcified mediastinal/hilar lymph nodes, suggesting prior granulomatous disease. Twelve-lead EKG reviewed by me shows sinus rhythm.  Right axis deviation.  Low voltage QRS.  Borderline T wave abnormalities There are no new results to review at this time.  Assessment and Plan: * Left displaced femoral neck fracture (HCC) Patient is status post an unwitnessed fall with an acute, displaced and impacted subcapital/transcervical left femoral neck fracture. Immobilize left lower extremity Pain control Muscle relaxants Orthopedic surgery consult.  Fall Status post unwitnessed fall with a left femoral neck fracture Treatment as outlined in 1  Vascular dementia without behavioral disturbance  (HCC) Patient with a history of advanced dementia and is wheelchair-bound Supportive care  Hypothyroidism Continue Synthroid  Paroxysmal atrial fibrillation (HCC) Continue rate control with diltiazem Not on long-term anticoagulation due to high risk for falls      Advance Care Planning:   Code Status: DNR   Consults: Orthopedic surgery, palliative care  Family Communication: Greater than 50% of time was spent discussing patient's condition and plan of care with her daughter and granddaughter at the bedside.  All questions and concerns have been addressed.  Patient's daughter will like to speak to the palliative care because she states that her mother has a MOST form and will not want anything done.  She also wants to be speak to the orthopedic  surgeon prior to making a decision.  Severity of Illness: The appropriate patient status for this patient is INPATIENT. Inpatient status is judged to be reasonable and necessary in order to provide the required intensity of service to  ensure the patient's safety. The patient's presenting symptoms, physical exam findings, and initial radiographic and laboratory data in the context of their chronic comorbidities is felt to place them at high risk for further clinical deterioration. Furthermore, it is not anticipated that the patient will be medically stable for discharge from the hospital within 2 midnights of admission.   * I certify that at the point of admission it is my clinical judgment that the patient will require inpatient hospital care spanning beyond 2 midnights from the point of admission due to high intensity of service, high risk for further deterioration and high frequency of surveillance required.*  Author: Lucile Shutters, MD 08/31/2022 12:55 PM  For on call review [email or web address].

## 2022-08-31 NOTE — ED Triage Notes (Signed)
Brought in by  ems for unwitnessed fall at [facility], pt found on floor in bathroom this morning.  Pt has pain in her left leg and EMS states it is rotated outwards and shortened. Has history of dementia.  DNR form with pt and at bedside.

## 2022-08-31 NOTE — Assessment & Plan Note (Signed)
Patient is status post an unwitnessed fall with an acute, displaced and impacted subcapital/transcervical left femoral neck fracture. Immobilize left lower extremity Pain control Muscle relaxants Orthopedic surgery consult.

## 2022-08-31 NOTE — Assessment & Plan Note (Signed)
Continue rate control with diltiazem Not on long-term anticoagulation due to high risk for falls

## 2022-08-31 NOTE — ED Notes (Signed)
Pt to xray

## 2022-09-01 ENCOUNTER — Encounter
Admission: EM | Disposition: A | Discharge status: Hospice | Payer: Self-pay | Source: Home / Self Care | Attending: Hospitalist

## 2022-09-01 ENCOUNTER — Inpatient Hospital Stay: Payer: Medicare Other

## 2022-09-01 ENCOUNTER — Inpatient Hospital Stay: Payer: Medicare Other | Admitting: Anesthesiology

## 2022-09-01 DIAGNOSIS — S72002A Fracture of unspecified part of neck of left femur, initial encounter for closed fracture: Secondary | ICD-10-CM | POA: Diagnosis not present

## 2022-09-01 HISTORY — PX: HIP ARTHROPLASTY: SHX981

## 2022-09-01 LAB — MRSA NEXT GEN BY PCR, NASAL: MRSA by PCR Next Gen: NOT DETECTED

## 2022-09-01 LAB — GLUCOSE, CAPILLARY
Glucose-Capillary: 148 mg/dL — ABNORMAL HIGH (ref 70–99)
Glucose-Capillary: 136 mg/dL — ABNORMAL HIGH (ref 70–99)

## 2022-09-01 SURGERY — HEMIARTHROPLASTY, HIP, DIRECT ANTERIOR APPROACH, FOR FRACTURE
Anesthesia: General | Site: Hip | Laterality: Left

## 2022-09-01 MED ORDER — MORPHINE SULFATE (PF) 2 MG/ML IV SOLN
INTRAVENOUS | Status: AC
  Administered 2022-09-01: 2 mg via INTRAVENOUS
  Filled 2022-09-01: qty 1

## 2022-09-01 MED ORDER — MORPHINE SULFATE (PF) 2 MG/ML IV SOLN: 2.0000 mg | Freq: Once | INTRAVENOUS | Status: AC

## 2022-09-01 MED ORDER — IPRATROPIUM-ALBUTEROL 0.5-2.5 (3) MG/3ML IN SOLN: 3.0000 mL | RESPIRATORY_TRACT | Status: AC | PRN

## 2022-09-01 MED ORDER — FENTANYL CITRATE (PF) 100 MCG/2ML IJ SOLN
INTRAMUSCULAR | Status: DC | PRN
  Administered 2022-09-01 (×4): 25 ug via INTRAVENOUS

## 2022-09-01 MED ORDER — EPHEDRINE SULFATE (PRESSORS) 50 MG/ML IJ SOLN
INTRAMUSCULAR | Status: DC | PRN
  Administered 2022-09-01: 5 mg via INTRAVENOUS
  Administered 2022-09-01: 2.5 mg via INTRAVENOUS
  Administered 2022-09-01: 5 mg via INTRAVENOUS

## 2022-09-01 MED ORDER — KETAMINE HCL 50 MG/5ML IJ SOSY
PREFILLED_SYRINGE | INTRAMUSCULAR | Status: AC
  Filled 2022-09-01: qty 5

## 2022-09-01 MED ORDER — PHENYLEPHRINE HCL-NACL 20-0.9 MG/250ML-% IV SOLN
INTRAVENOUS | Status: AC
  Filled 2022-09-01: qty 250

## 2022-09-01 MED ORDER — LACTATED RINGERS IV SOLN: INTRAVENOUS | Status: DC

## 2022-09-01 MED ORDER — PHENYLEPHRINE HCL (PRESSORS) 10 MG/ML IV SOLN
INTRAVENOUS | Status: DC | PRN
  Administered 2022-09-01 (×2): 240 ug via INTRAVENOUS
  Administered 2022-09-01: 320 ug via INTRAVENOUS
  Administered 2022-09-01 (×2): 160 ug via INTRAVENOUS

## 2022-09-01 MED ORDER — LACTATED RINGERS IV BOLUS
500.0000 mL | Freq: Once | INTRAVENOUS | Status: AC
  Administered 2022-09-01: 500 mL via INTRAVENOUS

## 2022-09-01 MED ORDER — IPRATROPIUM-ALBUTEROL 0.5-2.5 (3) MG/3ML IN SOLN: 3.0000 mL | RESPIRATORY_TRACT | Status: DC

## 2022-09-01 MED ORDER — LIDOCAINE HCL (PF) 2 % IJ SOLN
INTRAMUSCULAR | Status: AC
  Filled 2022-09-01: qty 5

## 2022-09-01 MED ORDER — VASOPRESSIN 20 UNIT/ML IV SOLN
INTRAVENOUS | Status: AC
  Filled 2022-09-01: qty 1

## 2022-09-01 MED ORDER — PHENYLEPHRINE HCL (PRESSORS) 10 MG/ML IV SOLN
INTRAVENOUS | Status: DC | PRN
  Administered 2022-09-01: 1 mL

## 2022-09-01 MED ORDER — SODIUM CHLORIDE 0.9 % IV SOLN: INTRAVENOUS | Status: DC

## 2022-09-01 MED ORDER — PROPOFOL 10 MG/ML IV BOLUS
INTRAVENOUS | Status: DC | PRN
  Administered 2022-09-01: 80 mg via INTRAVENOUS
  Administered 2022-09-01: 20 mg via INTRAVENOUS

## 2022-09-01 MED ORDER — BUPIVACAINE-EPINEPHRINE (PF) 0.5% -1:200000 IJ SOLN
INTRAMUSCULAR | Status: DC | PRN
  Administered 2022-09-01: 30 mL via PERINEURAL

## 2022-09-01 MED ORDER — ROCURONIUM BROMIDE 100 MG/10ML IV SOLN
INTRAVENOUS | Status: DC | PRN
  Administered 2022-09-01 (×2): 40 mg via INTRAVENOUS

## 2022-09-01 MED ORDER — BUPIVACAINE LIPOSOME 1.3 % IJ SUSP
INTRAMUSCULAR | Status: DC | PRN
  Administered 2022-09-01: 20 mL

## 2022-09-01 MED ORDER — MIDAZOLAM HCL 2 MG/2ML IJ SOLN
INTRAMUSCULAR | Status: AC
  Filled 2022-09-01: qty 2

## 2022-09-01 MED ORDER — DEXAMETHASONE SODIUM PHOSPHATE 10 MG/ML IJ SOLN
INTRAMUSCULAR | Status: AC
  Filled 2022-09-01: qty 1

## 2022-09-01 MED ORDER — ACETAMINOPHEN 10 MG/ML IV SOLN
INTRAVENOUS | Status: DC | PRN
  Administered 2022-09-01: 1000 mg via INTRAVENOUS

## 2022-09-01 MED ORDER — IPRATROPIUM-ALBUTEROL 0.5-2.5 (3) MG/3ML IN SOLN
RESPIRATORY_TRACT | Status: AC
  Administered 2022-09-01: 3 mL via RESPIRATORY_TRACT
  Filled 2022-09-01: qty 3

## 2022-09-01 MED ORDER — 0.9 % SODIUM CHLORIDE (POUR BTL) OPTIME
TOPICAL | Status: DC | PRN
  Administered 2022-09-01: 1000 mL

## 2022-09-01 MED ORDER — ACETAMINOPHEN 500 MG PO TABS
500.0000 mg | ORAL_TABLET | Freq: Four times a day (QID) | ORAL | Status: AC
  Filled 2022-09-01: qty 1

## 2022-09-01 MED ORDER — MIDAZOLAM HCL 2 MG/2ML IJ SOLN
INTRAMUSCULAR | Status: DC | PRN
  Administered 2022-09-01: 1 mg via INTRAVENOUS

## 2022-09-01 MED ORDER — KETOROLAC TROMETHAMINE 30 MG/ML IJ SOLN
INTRAMUSCULAR | Status: DC | PRN
  Administered 2022-09-01: 30 mg

## 2022-09-01 MED ORDER — ONDANSETRON HCL 4 MG PO TABS: 4.0000 mg | ORAL_TABLET | Freq: Four times a day (QID) | ORAL | Status: DC | PRN

## 2022-09-01 MED ORDER — DEXMEDETOMIDINE HCL IN NACL 400 MCG/100ML IV SOLN
0.2000 ug/kg/h | INTRAVENOUS | Status: DC
  Administered 2022-09-01: 0.2 ug/kg/h via INTRAVENOUS
  Administered 2022-09-02: 0.401 ug/kg/h via INTRAVENOUS
  Administered 2022-09-03 (×2): 0.6 ug/kg/h via INTRAVENOUS
  Filled 2022-09-01 (×5): qty 100

## 2022-09-01 MED ORDER — SUGAMMADEX SODIUM 200 MG/2ML IV SOLN
INTRAVENOUS | Status: DC | PRN
  Administered 2022-09-01: 200 mg via INTRAVENOUS

## 2022-09-01 MED ORDER — ENOXAPARIN SODIUM 40 MG/0.4ML IJ SOSY: 40.0000 mg | PREFILLED_SYRINGE | INTRAMUSCULAR | Status: DC

## 2022-09-01 MED ORDER — VASOPRESSIN 20 UNIT/ML IV SOLN
INTRAVENOUS | Status: DC | PRN
  Administered 2022-09-01: 1 [IU] via INTRAVENOUS
  Administered 2022-09-01 (×2): 2 [IU] via INTRAVENOUS

## 2022-09-01 MED ORDER — KETOROLAC TROMETHAMINE 30 MG/ML IJ SOLN
INTRAMUSCULAR | Status: AC
  Filled 2022-09-01: qty 1

## 2022-09-01 MED ORDER — ACETAMINOPHEN 10 MG/ML IV SOLN
INTRAVENOUS | Status: AC
  Filled 2022-09-01: qty 100

## 2022-09-01 MED ORDER — LIDOCAINE HCL (CARDIAC) PF 100 MG/5ML IV SOSY
PREFILLED_SYRINGE | INTRAVENOUS | Status: DC | PRN
  Administered 2022-09-01: 80 mg via INTRAVENOUS
  Administered 2022-09-01: 20 mg via INTRAVENOUS

## 2022-09-01 MED ORDER — ESMOLOL HCL 100 MG/10ML IV SOLN
INTRAVENOUS | Status: AC
  Filled 2022-09-01: qty 10

## 2022-09-01 MED ORDER — ONDANSETRON HCL 4 MG/2ML IJ SOLN
INTRAMUSCULAR | Status: DC | PRN
  Administered 2022-09-01: 4 mg via INTRAVENOUS

## 2022-09-01 MED ORDER — CHLORHEXIDINE GLUCONATE CLOTH 2 % EX PADS
6.0000 | MEDICATED_PAD | Freq: Every day | CUTANEOUS | Status: DC
  Administered 2022-09-04: 6 via TOPICAL

## 2022-09-01 MED ORDER — DEXAMETHASONE SODIUM PHOSPHATE 10 MG/ML IJ SOLN
INTRAMUSCULAR | Status: DC | PRN
  Administered 2022-09-01: 5 mg via INTRAVENOUS

## 2022-09-01 MED ORDER — ONDANSETRON HCL 4 MG/2ML IJ SOLN
INTRAMUSCULAR | Status: AC
  Filled 2022-09-01: qty 2

## 2022-09-01 MED ORDER — HALOPERIDOL LACTATE 5 MG/ML IJ SOLN
12.5000 mg | Freq: Four times a day (QID) | INTRAMUSCULAR | Status: DC | PRN
  Administered 2022-09-01 – 2022-09-02 (×2): 12.5 mg via INTRAVENOUS
  Filled 2022-09-01 (×2): qty 3

## 2022-09-01 MED ORDER — ACETAMINOPHEN 325 MG PO TABS: 325.0000 mg | ORAL_TABLET | Freq: Four times a day (QID) | ORAL | Status: DC | PRN

## 2022-09-01 MED ORDER — CEFAZOLIN SODIUM-DEXTROSE 2-4 GM/100ML-% IV SOLN
INTRAVENOUS | Status: AC
  Filled 2022-09-01: qty 100

## 2022-09-01 MED ORDER — LACTATED RINGERS IV SOLN: INTRAVENOUS | Status: DC | PRN

## 2022-09-01 MED ORDER — PHENYLEPHRINE HCL-NACL 20-0.9 MG/250ML-% IV SOLN
INTRAVENOUS | Status: DC | PRN
  Administered 2022-09-01: 50 ug/min via INTRAVENOUS

## 2022-09-01 MED ORDER — KETAMINE HCL 10 MG/ML IJ SOLN
INTRAMUSCULAR | Status: DC | PRN
  Administered 2022-09-01: 10 mg via INTRAVENOUS
  Administered 2022-09-01: 20 mg via INTRAVENOUS

## 2022-09-01 MED ORDER — TRANEXAMIC ACID 1000 MG/10ML IV SOLN
INTRAVENOUS | Status: DC | PRN
  Administered 2022-09-01: 1000 mg via INTRAVENOUS

## 2022-09-01 MED ORDER — FENTANYL CITRATE (PF) 100 MCG/2ML IJ SOLN
INTRAMUSCULAR | Status: AC
  Filled 2022-09-01: qty 2

## 2022-09-01 MED ORDER — ESMOLOL HCL 100 MG/10ML IV SOLN
INTRAVENOUS | Status: DC | PRN
  Administered 2022-09-01: 20 mg via INTRAVENOUS

## 2022-09-01 MED ORDER — MORPHINE SULFATE (PF) 2 MG/ML IV SOLN
2.0000 mg | INTRAVENOUS | Status: DC | PRN
  Administered 2022-09-01 – 2022-09-03 (×4): 2 mg via INTRAVENOUS
  Filled 2022-09-01 (×4): qty 1

## 2022-09-01 MED ORDER — PROPOFOL 1000 MG/100ML IV EMUL
INTRAVENOUS | Status: AC
  Filled 2022-09-01: qty 200

## 2022-09-01 SURGICAL SUPPLY — 74 items
BAG DECANTER FOR FLEXI CONT (MISCELLANEOUS) IMPLANT
BLADE SAGITTAL WIDE XTHICK NO (BLADE) ×1 IMPLANT
BLADE SAW SAG 25.4X90 (BLADE) ×1 IMPLANT
BLADE SURG SZ20 CARB STEEL (BLADE) ×1 IMPLANT
BNDG COHESIVE 6X5 TAN ST LF (GAUZE/BANDAGES/DRESSINGS) ×1 IMPLANT
BOWL CEMENT MIXING ADV NOZZLE (MISCELLANEOUS) IMPLANT
CEMENT BONE 40GM (Cement) IMPLANT
CEMENT RESTRICTOR DEPUY SZ 3 (Cement) IMPLANT
CHLORAPREP W/TINT 26 (MISCELLANEOUS) ×2 IMPLANT
DRAPE 3/4 80X56 (DRAPES) ×1 IMPLANT
DRAPE IMP U-DRAPE 54X76 (DRAPES) ×2 IMPLANT
DRAPE INCISE IOBAN 66X60 STRL (DRAPES) ×1 IMPLANT
DRAPE SURG 17X11 SM STRL (DRAPES) ×1 IMPLANT
DRAPE SURG 17X23 STRL (DRAPES) ×1 IMPLANT
DRSG MEPILEX FLEX 6X6 (GAUZE/BANDAGES/DRESSINGS) IMPLANT
DRSG MEPILEX SACRM 8.7X9.8 (GAUZE/BANDAGES/DRESSINGS) IMPLANT
DRSG OPSITE POSTOP 4X10 (GAUZE/BANDAGES/DRESSINGS) IMPLANT
DRSG OPSITE POSTOP 4X12 (GAUZE/BANDAGES/DRESSINGS) ×1 IMPLANT
DRSG OPSITE POSTOP 4X14 (GAUZE/BANDAGES/DRESSINGS) IMPLANT
DRSG OPSITE POSTOP 4X8 (GAUZE/BANDAGES/DRESSINGS) ×1 IMPLANT
ELECT CAUTERY BLADE 6.4 (BLADE) ×1 IMPLANT
ELECT REM PT RETURN 9FT ADLT (ELECTROSURGICAL) ×1
ELECTRODE REM PT RTRN 9FT ADLT (ELECTROSURGICAL) ×1 IMPLANT
GAUZE 4X4 16PLY ~~LOC~~+RFID DBL (SPONGE) ×1 IMPLANT
GAUZE PACK 2X3YD (PACKING) IMPLANT
GLOVE BIO SURGEON STRL SZ8 (GLOVE) ×3 IMPLANT
GLOVE BIOGEL M STRL SZ7.5 (GLOVE) IMPLANT
GLOVE BIOGEL PI IND STRL 8 (GLOVE) IMPLANT
GLOVE SURG UNDER LTX SZ8 (GLOVE) ×1 IMPLANT
GOWN STRL REUS W/ TWL LRG LVL3 (GOWN DISPOSABLE) ×1 IMPLANT
GOWN STRL REUS W/ TWL XL LVL3 (GOWN DISPOSABLE) ×1 IMPLANT
GOWN STRL REUS W/TWL LRG LVL3 (GOWN DISPOSABLE) ×1
GOWN STRL REUS W/TWL XL LVL3 (GOWN DISPOSABLE) ×1
HEAD ENDO II MOD SZ 49 (Orthopedic Implant) IMPLANT
HOLSTER ELECTROSUGICAL PENCIL (MISCELLANEOUS) ×1 IMPLANT
HOOD PEEL AWAY T7 (MISCELLANEOUS) ×1 IMPLANT
INSERT TAPER ENDO II -6 (Orthopedic Implant) IMPLANT
IV NS 100ML SINGLE PACK (IV SOLUTION) IMPLANT
IV NS IRRIG 3000ML ARTHROMATIC (IV SOLUTION) ×2 IMPLANT
KIT PREP HIP W/CEMENT RESTRICT (Miscellaneous) IMPLANT
KIT PREPARATION TOTAL HIP (Miscellaneous) ×1 IMPLANT
LABEL OR SOLS (LABEL) ×1 IMPLANT
MANIFOLD NEPTUNE II (INSTRUMENTS) ×1 IMPLANT
NDL FILTER BLUNT 18X1 1/2 (NEEDLE) ×1 IMPLANT
NDL SAFETY ECLIP 18X1.5 (MISCELLANEOUS) ×1 IMPLANT
NDL SPNL 20GX3.5 QUINCKE YW (NEEDLE) ×1 IMPLANT
NEEDLE FILTER BLUNT 18X1 1/2 (NEEDLE) ×1 IMPLANT
NEEDLE SPNL 20GX3.5 QUINCKE YW (NEEDLE) ×1 IMPLANT
NS IRRIG 1000ML POUR BTL (IV SOLUTION) ×1 IMPLANT
PACK HIP PROSTHESIS (MISCELLANEOUS) ×1 IMPLANT
PULSAVAC PLUS IRRIG FAN TIP (DISPOSABLE) ×1
SPIKE FLUID TRANSFER (MISCELLANEOUS) ×2 IMPLANT
SPONGE T-LAP 18X18 ~~LOC~~+RFID (SPONGE) ×4 IMPLANT
STAPLER SKIN PROX 35W (STAPLE) ×1 IMPLANT
STEM CENTRALIZER SZ12 (Stem) IMPLANT
STEM HIP FEM 11X140 TAPER (Stem) IMPLANT
STRAP SAFETY 5IN WIDE (MISCELLANEOUS) ×1 IMPLANT
SUT TICRON 2-0 30IN 311381 (SUTURE) ×1 IMPLANT
SUT VIC AB 1 CT1 36 (SUTURE) IMPLANT
SUT VIC AB 2-0 CT1 (SUTURE) ×2 IMPLANT
SUT VIC AB 2-0 CT1 27 (SUTURE)
SUT VIC AB 2-0 CT1 TAPERPNT 27 (SUTURE) IMPLANT
SUT VICRYL 1-0 27IN ABS (SUTURE) ×2
SUTURE VICRYL 1-0 27IN ABS (SUTURE) ×2 IMPLANT
SYR 10ML LL (SYRINGE) ×1 IMPLANT
SYR 30ML LL (SYRINGE) ×3 IMPLANT
SYR TB 1ML 27GX1/2 LL (SYRINGE) IMPLANT
TAPE TRANSPORE STRL 2 31045 (GAUZE/BANDAGES/DRESSINGS) ×1 IMPLANT
TIP BRUSH PULSAVAC PLUS 24.33 (MISCELLANEOUS) IMPLANT
TIP FAN IRRIG PULSAVAC PLUS (DISPOSABLE) ×1 IMPLANT
TRAP FLUID SMOKE EVACUATOR (MISCELLANEOUS) ×2 IMPLANT
WATER STERILE IRR 1000ML POUR (IV SOLUTION) IMPLANT
WATER STERILE IRR 500ML POUR (IV SOLUTION) ×1 IMPLANT
distal stem centralizer Implant IMPLANT

## 2022-09-01 NOTE — Progress Notes (Signed)
Patient arrived to unit from PACU, soon after admission patient became agitated and began pulling off her oxygen, gown, and then her IV.  Order received for precidex from Dr. Fran Lowes which was effective.

## 2022-09-01 NOTE — Progress Notes (Signed)
Hospitalist at bedside to assess patient.Family updated via telephone by PACU nurse Marissa Calamity RN

## 2022-09-01 NOTE — OR Nursing (Signed)
Patient came to OR with foley catheter in place. Bloody urine noted in bag upon arrival

## 2022-09-01 NOTE — ED Notes (Addendum)
Pt attempting to self remove foley catheter, pt striking nursing staff, screaming "I am getting out of bed" repeatedly, and self removed IV. Bleeding controlled. Pt placed in mittens for safety, pt repeatedly removed mittens and attempting to get out of bed several times. Pt inconsolable. New IV placed in LAC. 0.5mg  IV morphine given with no relief. Provider notified, morphine order changed. Pt given additional 2mg  morphine. Pt continuing to try to remove mittens and repeatedly saying "I have to get up, please help me stand up." Several attempts made to redirect patient and explain to patient that her hip is fractured. Pt not redirectable at this time. Bedside monitor on and mittens in place.

## 2022-09-01 NOTE — Anesthesia Postprocedure Evaluation (Signed)
Anesthesia Post Note  Patient: Karen Colon  Procedure(s) Performed: ARTHROPLASTY BIPOLAR HIP (HEMIARTHROPLASTY) (Left: Hip)  Patient location during evaluation: PACU Anesthesia Type: General Level of consciousness: responds to stimulation and sedated Pain management: pain level controlled Respiratory status: spontaneous breathing and non-rebreather facemask Cardiovascular status: blood pressure returned to baseline and stable Postop Assessment: no headache and no apparent nausea or vomiting Anesthetic complications: no Comments: Discussed patient with hospitalist -- due to increased oxygen requirements will transfer to step down. Daughter aware of disposition.   No notable events documented.   Last Vitals:  Vitals:   09/01/22 1300 09/01/22 1315  BP:    Pulse:    Resp:    Temp: 36.7 C   SpO2: 93% 94%    Last Pain:  Vitals:   08/31/22 1816  TempSrc: Oral                 Reed Breech

## 2022-09-01 NOTE — Progress Notes (Signed)
  PROGRESS NOTE    Karen Colon  KNL:[id number] DOB: Jan 27, 1926 DOA: 08/31/2022 PCP: Earnestine Mealing, MD  [id number]  LOS: 1 day   Brief hospital course:   Assessment & Plan: Karen Colon is a 88 y.o. female with medical history significant for advanced dementia, nonambulatory (wheelchair-bound ), hypertension, hypothyroidism, depression, hearing loss who was brought into the ER by EMS for evaluation following an unwitnessed fall at [facility].  Patient was said to have been found on the floor in the bathroom.  She had complaints of left leg pain and her left leg was noted to be shortened and outwardly rotated.    * Left displaced femoral neck fracture (HCC) --left HEMIARTHROPLASTY today.  Acute hypoxemic respiratory failure --post-op, pt was sating 80% on non-rebreather.  Unclear etiology.  CXR no acute finding.   --Discussed with daughter, who said pt would not want to be mech ventilated.   --transfer to stepdown for precedex gtt in order to keep supplemental oxygen on, since pt was found to pull off her oxygen.   Fall Status post unwitnessed fall with a left femoral neck fracture   Vascular dementia without behavioral disturbance (HCC) Patient with a history of advanced dementia and is wheelchair-bound Supportive care   Hypothyroidism Continue Synthroid   Paroxysmal atrial fibrillation (HCC) Not on long-term anticoagulation due to high risk for falls --cont cardizem   DVT prophylaxis: Lovenox SQ Code Status: DNR no intubation  Family Communication: daughter updated on the phone today Level of care: Stepdown Dispo:   The patient is from: SNF Anticipated d/c is to: SNF Anticipated d/c date is: undetermined   Subjective and Interval History:  Pt went for left HEMIARTHROPLASTY today.  Post-op, pt was sating 80% on non-rebreather.  Discussed with daughter, who said pt would not want to be mech ventilated.  Pt was transferred to stepdown for precedex gtt in order to  keep oxygen on.   Objective: Vitals:   09/01/22 1500 09/01/22 1548 09/01/22 1600 09/01/22 1651  BP: 106/63 118/70 122/67   Pulse: (!) 115 (!) 115 (!) 114   Resp: 13 18 20    Temp:  98.5 F (36.9 C)    TempSrc:  Axillary    SpO2: 93% 97% 96% 94%  Weight:      Height:        Intake/Output Summary (Last 24 hours) at 09/01/2022 1716 Last data filed at 09/01/2022 1445 Gross per 24 hour  Intake 1000 ml  Output 935 ml  Net 65 ml   Filed Weights   08/31/22 0810  Weight: 73.8 kg    Examination:   Constitutional: NAD, sedated CV: No cyanosis.   RESP: on non-rebreather SKIN: warm, dry   Data Reviewed: I have personally reviewed labs and imaging studies  Time spent: 50 minutes  14/08/23, MD [facility] If 7PM-7AM, please contact night-coverage 09/01/2022, 5:16 PM

## 2022-09-01 NOTE — ED Notes (Signed)
Pt refused pain meds at this time.  

## 2022-09-01 NOTE — Plan of Care (Signed)
Continuing with plan of care. 

## 2022-09-01 NOTE — ED Notes (Signed)
Pt self removed mittens. Will continue to monitor to see if pt is able to safely remain without mittens. Pt in NAD at this time.

## 2022-09-01 NOTE — Anesthesia Procedure Notes (Signed)
Procedure Name: Intubation Date/Time: 09/01/2022 9:59 AM  Performed by: Lynden Oxford, CRNAPre-anesthesia Checklist: Patient identified, Emergency Drugs available, Suction available and Patient being monitored Patient Re-evaluated:Patient Re-evaluated prior to induction Oxygen Delivery Method: Circle system utilized Preoxygenation: Pre-oxygenation with 100% oxygen Induction Type: IV induction Ventilation: Mask ventilation without difficulty Laryngoscope Size: McGraph and 3 Grade View: Grade I Tube type: Oral Tube size: 7.0 mm Number of attempts: 1 Airway Equipment and Method: Stylet and Video-laryngoscopy Placement Confirmation: ETT inserted through vocal cords under direct vision, positive ETCO2 and breath sounds checked- equal and bilateral Secured at: 21 cm Tube secured with: Tape Dental Injury: Teeth and Oropharynx as per pre-operative assessment

## 2022-09-01 NOTE — Op Note (Signed)
09/01/2022  12:13 PM  Patient:   Karen Colon  Pre-Op Diagnosis:   Displaced femoral neck fracture, left hip.  Post-Op Diagnosis:   Same.  Procedure:   Left hip unipolar hemiarthroplasty.  Surgeon:   Pascal Lux, MD  Assistant:   Reche Dixon, PA-C  Anesthesia:   GET  Findings:   As above.  Complications:   None  EBL:   100 cc  Fluids:   800 cc crystalloid  UOP:   75 cc  TT:   None  Drains:   None  Closure:   Staples  Implants:   Biomet cemented system with a #11 lateral offset reduced proximal profile Echo cemented femoral stem, a 49 mm outer diameter shell, and a -6 mm neck adapter.  Brief Clinical Note:   The patient is a 88 year old female who sustained the above-noted injury yesterday morning when she apparently fell while getting up to go the bathroom at her nursing home. She was brought to the emergency room where x-rays demonstrated the above-noted injury. The patient has been cleared medically and presents at this time for definitive management of the injury.  Procedure:   The patient was brought into the operating room and laid in the supine position. After adequate general endotracheal intubation and anesthesia was obtained, the patient was repositioned in the right lateral decubitus position and secured using a lateral hip positioner. The left hip and lower extremity were prepped with ChloroPrep solution before being draped sterilely. Preoperative antibiotics were administered. A timeout was performed to verify the appropriate surgical site.    A standard posterior approach to the hip was made through an approximately 4-5 inch incision. The incision was carried down through the subcutaneous tissues to expose the gluteal fascia and proximal end of the iliotibial band. These structures were split the length of the incision and the Charnley self-retaining hip retractor placed. The bursal tissues were swept posteriorly to expose the short external rotators. The  anterior border of the piriformis tendon was identified and this plane developed down through the capsule to enter the joint. Abundant fracture hematoma was suctioned. A flap of tissue was elevated off the posterior aspect of the femoral neck and greater trochanter and retracted posteriorly. This flap included the piriformis tendon, the short external rotators, and the posterior capsule. The femoral head was removed in its entirety, then taken to the back table where it was measured and found to be optimally replicated by a 49 mm head. The appropriate trial head was inserted and found to demonstrate an excellent suction fit.   Attention was directed to the femoral side. The femoral neck was recut 10-12 mm above the lesser trochanter using an oscillating saw. The piriformis fossa was debrided of soft tissues before the intramedullary canal was accessed through this point using a triple step reamer. The canal was reamed sequentially beginning with a #7 tapered reamer and progressing to a #13 tapered reamer. This provided excellent circumferential chatter. A box osteotome was used to establish version before the canal was broached sequentially beginning with a #10 broach and progressing to a #13 broach. This was left in place and several trial reductions performed using both the -6 mm and -3 mm neck lengths. The -6 mm neck length demonstrated excellent stability both in extension and external rotation as well as with flexion to 90 and internal rotation beyond 70. It also was stable in the position of sleep.   Given the patient's age and bone quality, it was felt  best to proceed with a cemented femoral stem. Therefore, the femoral canal was prepared for cementing by irrigating it thoroughly with the jet lavage system. The distal canal was measured and found to be optimally replicated by a #3 distal cement restrictor. This was inserted to the appropriate depth. The canal was then packed with an Neo-Synephrine  soaked vaginal sponge while the cement was prepared on the back table. When it was ready, the vaginal pack was removed and the canal again suctioned dry. The cement was introduced from distal to proximal and then pressurized before the #11 cemented stem was inserted while maintaining the appropriate version. The excess cement was removed using a Therapist, nutritional. Pressure was maintained on the prosthesis until the cement hardened.  A repeat trial reduction was performed using the -3 mm neck adapter with the 49 mm outer diameter head. This proved to be too tight in extension. Therefore, the -6 mm neck adapter was selected.   The permanent 49 mm outer diameter shell with the -6 mm neck adapter construct was put together on the back table before being impacted onto the stem of the femoral component. The Morse taper locking mechanism was verified using manual distraction before the head was relocated and the hip placed through a range of motion with the findings as described above.  The wound was copiously irrigated with sterile saline solution via the jet lavage system before the peri-incisional and pericapsular tissues were injected with a "cocktail" consisting of 30 cc of 0.5% Sensorcaine with epinephrine, 20 cc of Exparel, 15 mg of Toradol, and 40 mg of Kenalog 40 to help with postoperative analgesia. The posterior flap was reapproximated to the posterior aspect of the greater trochanter using #2 Tycron interrupted sutures placed through drill holes. The iliotibial band was reapproximated using #1 Vicryl interrupted sutures before the gluteal fascia was closed using a running #1 Vicryl suture. At this point, 1 g of transexemic acid in 10 cc of normal saline was injected into the joint to help reduce postoperative bleeding. The subcutaneous tissues were closed in several layers using 2-0 Vicryl interrupted sutures before the skin was closed using staples. A sterile occlusive dressing was applied to the wound . The  patient then was rolled back into the supine position on the hospital bed before being awakened, extubated, and returned to the recovery room in satisfactory condition after tolerating the procedure well.

## 2022-09-01 NOTE — Transfer of Care (Signed)
Immediate Anesthesia Transfer of Care Note  Patient: Karen Colon  Procedure(s) Performed: ARTHROPLASTY BIPOLAR HIP (HEMIARTHROPLASTY) (Left: Hip)  Patient Location: PACU  Anesthesia Type:General  Level of Consciousness: drowsy  Airway & Oxygen Therapy: Patient Spontanous Breathing and Patient connected to face mask oxygen  Post-op Assessment: Report given to RN and Post -op Vital signs reviewed and stable  Post vital signs: Reviewed and stable  Last Vitals:  Vitals Value Taken Time  BP 142/95 09/01/22 1219  Temp 36.7 C 09/01/22 1220  Pulse 108 09/01/22 1231  Resp 17 09/01/22 1231  SpO2 88 % 09/01/22 1231  Vitals shown include unvalidated device data.  Last Pain:  Vitals:   08/31/22 1816  TempSrc: Oral         Complications: No notable events documented.

## 2022-09-01 NOTE — ED Notes (Signed)
This RN explained to patient that she was holding onto a lot of urine, needed to have a foley cath placed prior to surgery, and that I would be getting her into a gown and having those things done at this time so that it is all done in one attempt to prioritize comfort of patient. Pt began screaming and striking at nurses despite several redirection attempts. Pt inconsolable, 1mg  haldol given IV. , RN and Aundra Millet, RN assisted with cath placement. Florentina Addison urine in collection bag immediately after placement. Pt placed in gown and warm blankets provided. Pt in NAD at this time, resting in bed quietly with eyes closed. Equal chest rise and fall noted, pt on bedside cardiac monitor.

## 2022-09-02 ENCOUNTER — Other Ambulatory Visit: Payer: Self-pay

## 2022-09-02 DIAGNOSIS — S72002A Fracture of unspecified part of neck of left femur, initial encounter for closed fracture: Secondary | ICD-10-CM | POA: Diagnosis not present

## 2022-09-02 LAB — CBC
HCT: 29.8 % — ABNORMAL LOW (ref 36.0–46.0)
Hemoglobin: 9.6 g/dL — ABNORMAL LOW (ref 12.0–15.0)
MCH: 29.4 pg (ref 26.0–34.0)
MCHC: 32.2 g/dL (ref 30.0–36.0)
MCV: 91.4 fL (ref 80.0–100.0)
Platelets: 234 10*3/uL (ref 150–400)
RBC: 3.26 MIL/uL — ABNORMAL LOW (ref 3.87–5.11)
RDW: 15 % (ref 11.5–15.5)
WBC: 10.4 10*3/uL (ref 4.0–10.5)
nRBC: 0 % (ref 0.0–0.2)

## 2022-09-02 LAB — BASIC METABOLIC PANEL
Anion gap: 4 — ABNORMAL LOW (ref 5–15)
BUN: 42 mg/dL — ABNORMAL HIGH (ref 8–23)
CO2: 26 mmol/L (ref 22–32)
Calcium: 8.2 mg/dL — ABNORMAL LOW (ref 8.9–10.3)
Chloride: 111 mmol/L (ref 98–111)
Creatinine, Ser: 1.02 mg/dL — ABNORMAL HIGH (ref 0.44–1.00)
GFR, Estimated: 51 mL/min — ABNORMAL LOW (ref >60)
Glucose, Bld: 132 mg/dL — ABNORMAL HIGH (ref 70–99)
Potassium: 4.4 mmol/L (ref 3.5–5.1)
Sodium: 141 mmol/L (ref 135–145)

## 2022-09-02 LAB — MAGNESIUM: Magnesium: 2.2 mg/dL (ref 1.7–2.4)

## 2022-09-02 MED ORDER — BISACODYL 10 MG RE SUPP: 10.0000 mg | Freq: Every day | RECTAL | Status: DC | PRN

## 2022-09-02 MED ORDER — METOCLOPRAMIDE HCL 5 MG PO TABS: 5.0000 mg | ORAL_TABLET | Freq: Three times a day (TID) | ORAL | Status: DC | PRN

## 2022-09-02 MED ORDER — ENOXAPARIN SODIUM 40 MG/0.4ML IJ SOSY
40.0000 mg | PREFILLED_SYRINGE | INTRAMUSCULAR | Status: DC
  Administered 2022-09-02 – 2022-09-04 (×3): 40 mg via SUBCUTANEOUS
  Filled 2022-09-02 (×2): qty 0.4

## 2022-09-02 MED ORDER — DOCUSATE SODIUM 100 MG PO CAPS: 100.0000 mg | ORAL_CAPSULE | Freq: Two times a day (BID) | ORAL | Status: DC

## 2022-09-02 MED ORDER — CEFAZOLIN SODIUM-DEXTROSE 2-4 GM/100ML-% IV SOLN: 2.0000 g | Freq: Four times a day (QID) | INTRAVENOUS | Status: DC

## 2022-09-02 MED ORDER — DIPHENHYDRAMINE HCL 12.5 MG/5ML PO ELIX: 12.5000 mg | ORAL_SOLUTION | ORAL | Status: DC | PRN

## 2022-09-02 MED ORDER — MAGNESIUM HYDROXIDE 400 MG/5ML PO SUSP: 30.0000 mL | Freq: Every day | ORAL | Status: DC | PRN

## 2022-09-02 MED ORDER — METOCLOPRAMIDE HCL 5 MG/ML IJ SOLN: 5.0000 mg | Freq: Three times a day (TID) | INTRAMUSCULAR | Status: DC | PRN

## 2022-09-02 NOTE — Evaluation (Signed)
Physical Therapy Evaluation Patient Details Name: Karen Colon MRN: [id number] DOB: May 02, 1926 Today's Date: 09/02/2022  History of Present Illness  Pt is a 88 y/o F admitted on 08/31/22 after presenting to the ER for evaluation following an unwitnessed fall. Pt noted to have L displaced femoral neck fx & underwent L hip unipolar hemiarthroplasty on 09/01/22. PMH: advanced dementia, nonambulatory (w/c bound), HTN, hypothyroidism, depression, hearing loss  Clinical Impression  Ortho MD reports pt is WBAT LLE with posterior hip precautions via secure chat (notes order set did not carry over when pt transferred to unit).  Spoke to pt's daughter Dewayne Hatch) via telephone who reports pt was at [facility] & is able to complete bed<>w/c transfers without assistance then propel w/c with BUE & BLE prior to admission.  Pt seen for PT evaluation with pt received in bed with eyes closed & pt not responding to verbal communication from PT. PT & NT assist pt with sitting EOB in attempts to increase pt's alertness & this was successful. Pt is very confused, reporting she has to go to work & requesting to get up. PT & NT provided max assist +2 HHA to safely assist pt bed>recliner via stand pivot. Pt demonstrates increased pain & weakness in LLE, limiting ability to weight shift to LLE to allow stepping with RLE. Due to pt's significant dementia, doubt pt can safely utilize RW at this time but can try at later date if appropriate. Pt would benefit from STR upon d/c to maximize independence with functional mobility & reduce fall risk.   Recommendations for follow up therapy are one component of a multi-disciplinary discharge planning process, led by the attending physician.  Recommendations may be updated based on patient status, additional functional criteria and insurance authorization.  Follow Up Recommendations Skilled nursing-short term rehab (<3 hours/day) Can patient physically be transported by private vehicle:  No    Assistance Recommended at Discharge Frequent or constant Supervision/Assistance  Patient can return home with the following  Two people to help with bathing/dressing/bathroom;Two people to help with walking and/or transfers;Direct supervision/assist for medications management;Help with stairs or ramp for entrance;Assist for transportation;Assistance with feeding;Assistance with cooking/housework;Direct supervision/assist for financial management    Equipment Recommendations None recommended by PT (TBD in next venue)  Recommendations for Other Services       Functional Status Assessment Patient has had a recent decline in their functional status and demonstrates the ability to make significant improvements in function in a reasonable and predictable amount of time.     Precautions / Restrictions Precautions Precautions: Fall;Posterior Hip Restrictions Weight Bearing Restrictions: Yes LLE Weight Bearing: Weight bearing as tolerated      Mobility  Bed Mobility Overal bed mobility: Needs Assistance Bed Mobility: Supine to Sit     Supine to sit: Max assist, Total assist, +2 for physical assistance, +2 for safety/equipment, HOB elevated     General bed mobility comments: Pt lying in bed with eyes closed, not engaging with PT. PT & NT assist pt with sitting EOB to see if this increases pt's alertness & it does.    Transfers Overall transfer level: Needs assistance Equipment used: 2 person hand held assist Transfers: Bed to chair/wheelchair/BSC   Stand pivot transfers: Max assist, +2 physical assistance, +2 safety/equipment         General transfer comment: Positioned RW in front of pt to see if she would utilize it, pt does briefly hold to it with 1UE but primarily holds to PT/NT's hands for  support. Educated Education administrator to utilize HHA vs RW as pt is limited cognitively & unsafe to use AD. Pt requires max assist +2 for stand pivot bed>recliner on R. Pt with difficulty  weight shifting to LLE to advance RLE. Requires assistance to safely lower into recliner.    Ambulation/Gait                  Stairs            Wheelchair Mobility    Modified Rankin (Stroke Patients Only)       Balance Overall balance assessment: Needs assistance Sitting-balance support: Feet supported, Bilateral upper extremity supported Sitting balance-Leahy Scale: Fair Sitting balance - Comments: close supervision static sitting EOB   Standing balance support: Bilateral upper extremity supported, During functional activity Standing balance-Leahy Scale: Zero                               Pertinent Vitals/Pain Pain Assessment Pain Assessment: Faces Faces Pain Scale: Hurts little more Pain Location: L hip with movement Pain Descriptors / Indicators: Guarding, Grimacing, Discomfort Pain Intervention(s): Monitored during session, Repositioned    Home Living Family/patient expects to be discharged to:: Skilled nursing facility                   Additional Comments: [facility] SN[facility]    Prior Function               Mobility Comments: Per daughter, pt was able to complete bed<>w/c transfers without assistance. Pt would propel w/c with BUE & BLE.       Hand Dominance        Extremity/Trunk Assessment   Upper Extremity Assessment Upper Extremity Assessment: Difficult to assess due to impaired cognition;Generalized weakness    Lower Extremity Assessment Lower Extremity Assessment: Generalized weakness;Difficult to assess due to impaired cognition;LLE deficits/detail LLE Deficits / Details: 3-/5 knee extension in sitting (pt copies what PT demonstrates to her)       Communication   Communication: HOH  Cognition Arousal/Alertness: Awake/alert Behavior During Therapy: WFL for tasks assessed/performed Overall Cognitive Status: History of cognitive impairments - at baseline                                 General  Comments: Pt with hx of dementia, poor ability to follow simple one step commands. Pt attempting to remove nasal cannual, requiring max education/encouragement to keep it donned.        General Comments General comments (skin integrity, edema, etc.): Pt received on 2L/min via nasal cannula with SPO2 98-100%. Pt placed on room air but quickly dropped to 90% so pt placed back on 2L/min.    Exercises     Assessment/Plan    PT Assessment Patient needs continued PT services  PT Problem List Decreased strength;Cardiopulmonary status limiting activity;Pain;Decreased range of motion;Decreased activity tolerance;Decreased balance;Decreased knowledge of use of DME;Decreased mobility;Decreased knowledge of precautions;Decreased safety awareness       PT Treatment Interventions DME instruction;Therapeutic exercise;Balance training;Wheelchair mobility training;Stair training;Neuromuscular re-education;Functional mobility training;Patient/family education;Therapeutic activities;Modalities    PT Goals (Current goals can be found in the Care Plan section)  Acute Rehab PT Goals Patient Stated Goal: none stated PT Goal Formulation: Patient unable to participate in goal setting Time For Goal Achievement: 09/16/22 Potential to Achieve Goals: Poor    Frequency 7X/week  Co-evaluation               AM-PAC PT "6 Clicks" Mobility  Outcome Measure Help needed turning from your back to your side while in a flat bed without using bedrails?: Total Help needed moving from lying on your back to sitting on the side of a flat bed without using bedrails?: Total Help needed moving to and from a bed to a chair (including a wheelchair)?: Total Help needed standing up from a chair using your arms (e.g., wheelchair or bedside chair)?: Total Help needed to walk in hospital room?: Total Help needed climbing 3-5 steps with a railing? : Total 6 Click Score: 6    End of Session Equipment Utilized During  Treatment: Oxygen Activity Tolerance: Patient tolerated treatment well Patient left: in chair;with nursing/sitter in room Nurse Communication: Mobility status PT Visit Diagnosis: Unsteadiness on feet (R26.81);Difficulty in walking, not elsewhere classified (R26.2);Muscle weakness (generalized) (M62.81);Other abnormalities of gait and mobility (R26.89);Pain Pain - Right/Left: Left Pain - part of body: Hip    Time: 5573-2202 PT Time Calculation (min) (ACUTE ONLY): 12 min   Charges:   PT Evaluation $PT Eval High Complexity: 1 High          Aleda Grana, PT, DPT 09/02/22, 10:10 AM  Sandi Mariscal 09/02/2022, 10:07 AM

## 2022-09-02 NOTE — Progress Notes (Signed)
CSW spoke with Sue Lush with admissions at [facility]. CSW was told patient is a resident there under long term care SNF.

## 2022-09-02 NOTE — Progress Notes (Signed)
  PROGRESS NOTE    Karen Colon  RKY:[id number] DOB: [birth year] DOA: 08/31/2022 PCP: Earnestine Mealing, MD  [id number]  LOS: 2 days   Brief hospital course:   Assessment & Plan: Karen Colon is a 88 y.o. female with medical history significant for advanced dementia, nonambulatory (wheelchair-bound ), hypertension, hypothyroidism, depression, hearing loss who was brought into the ER by EMS for evaluation following an unwitnessed fall at [facility].  Patient was said to have been found on the floor in the bathroom.  She had complaints of left leg pain and her left leg was noted to be shortened and outwardly rotated.    * Left displaced femoral neck fracture (HCC) S/p left HEMIARTHROPLASTY on 09/01/22. --PT/OT --pain control --Lovenox for DVT prophylaxis   Acute hypoxemic respiratory failure --post-op, pt was sating 80% on non-rebreather.  Unclear etiology.  CXR no acute finding.   --Discussed with daughter, who said pt would not want to be mech ventilated.   --transferred to stepdown for precedex gtt in order to keep supplemental oxygen on, since pt was found to pull off her oxygen. Plan: --Continue supplemental O2 to keep sats >=92%, wean as tolerated   Fall Status post unwitnessed fall with a left femoral neck fracture   Vascular dementia without behavioral disturbance (HCC) Patient with a history of advanced dementia and is wheelchair-bound Supportive care   Hypothyroidism Continue Synthroid   Paroxysmal atrial fibrillation (HCC) Not on long-term anticoagulation due to high risk for falls --cont cardizem  AKI --Cr went from 0.63 to 1.02 this morning. --cont MIVF@50    DVT prophylaxis: Lovenox SQ Code Status: DNR no intubation  Family Communication:  Level of care: Stepdown Dispo:   The patient is from: SNF LTC Anticipated d/c is to: SNF LTC Anticipated d/c date is: 1-2 days   Subjective and Interval History:  O2 weaned down to 2L this morning.  Pt got up to  the chair, was sleepy from morphine.   Objective: Vitals:   09/02/22 1000 09/02/22 1100 09/02/22 1200 09/02/22 1300  BP: (!) 98/53 (!) 87/54 (!) 94/53 (!) 82/67  Pulse: 77 66 69 73  Resp: (!) 21 14 15 14   Temp:      TempSrc:      SpO2: 99% 99% 100% 99%  Weight:      Height:        Intake/Output Summary (Last 24 hours) at 09/02/2022 1438 Last data filed at 09/02/2022 1400 Gross per 24 hour  Intake 2042.48 ml  Output 573 ml  Net 1469.48 ml   Filed Weights   08/31/22 0810 09/02/22 0553  Weight: 73.8 kg 71.9 kg    Examination:   Constitutional: NAD, sleepy, sitting in chair HEENT: conjunctivae and lids normal, EOMI CV: No cyanosis.   RESP: normal respiratory effort, on 2L Extremities: No effusions, edema in BLE SKIN: warm, dry   Data Reviewed: I have personally reviewed labs and imaging studies  Time spent: 35 minutes  14/10/23, MD [facility] If 7PM-7AM, please contact night-coverage 09/02/2022, 2:38 PM

## 2022-09-02 NOTE — Plan of Care (Signed)
Continuing with plan of care. 

## 2022-09-02 NOTE — Progress Notes (Signed)
Initial Nutrition Assessment RD working remotely.   DOCUMENTATION CODES:   Not applicable  INTERVENTION:  - monitor for diet advancement.  - complete NFPE when feasible.   NUTRITION DIAGNOSIS:   Increased nutrient needs related to hip fracture, post-op healing as evidenced by estimated needs.  GOAL:   Patient will meet greater than or equal to 90% of their needs  MONITOR:   Diet advancement, Labs, Weight trends, Skin  REASON FOR ASSESSMENT:   Consult Hip fracture protocol  ASSESSMENT:   88 y.o. female with medical history of advanced dementia, non-ambulatory (wheelchair-bound) at baseline, HTN, hypothyroidism, depression, hearing loss. She presented to the ED via EMS after an unwitnessed falling at her living facility. She complained of L leg pain and it was noted that L leg was shortened and turned outward.  Patient noted to be a/o to self only; she has a history of dementia. Patient is POD #1 L hip unipolar hemiarthroplasty.  Patient has not been assessed by a [place] RD at any time in the past.  Weight today is 158 lb and PTA the most recently documented weight was 164 lb on 11/03/20. No information documented in the edema section of flow sheet.   Labs reviewed; BUN: 42 mg/dl, creatinine: 3.30 mg/dl, Ca: 8.2 mg/dl, GFR: 51 ml/min.  Medications reviewed; 100 mg colace BID, 75 mcg oral synthroid/day, 17 g miralax/day, 1 packet metamucil/day, 1 tablet senokot BID.  IVF; LR @ 50 ml/hr.  NUTRITION - FOCUSED PHYSICAL EXAM:  RD working remotely.  Diet Order:   Diet Order     None       EDUCATION NEEDS:   No education needs have been identified at this time  Skin:  Skin Assessment: Skin Integrity Issues: Skin Integrity Issues:: Incisions Incisions: L hip (12/9)  Last BM:  PTA/unknown  Height:   Ht Readings from Last 1 Encounters:  08/31/22 5\' 4"  (1.626 m)    Weight:   Wt Readings from Last 1 Encounters:  09/02/22 71.9 kg     BMI:  Body  mass index is 27.21 kg/m.  Estimated Nutritional Needs:  Kcal:  1600-1800 kcal Protein:  75-85 grams Fluid:  >/= 1.7 L/day     14/10/23, MS, RD, LDN, CNSC Clinical Dietitian PRN/Relief staff On-call/weekend pager # available in [facility]

## 2022-09-02 NOTE — Progress Notes (Signed)
Patient has had an eventful day, patient is unhappy and becomes agitated anytime she is touched and speaks in a loud and demanding manner to staff.  Throughout the shift patient has swatted at staff, attempted to bite staff, and verbally aggressive.  Patient has refused to take any po medications and further would not allow nurse to take temperature as she becomes agitated to the point of obtaining temperature is unsafe.  Patient continues with Recruitment consultant and continues to attempt to remove oxygen.  Will endorse to oncoming shift.

## 2022-09-02 NOTE — Progress Notes (Signed)
Patient ID: Karen Colon, female   DOB: 10-15-1925, 88 y.o.   MRN: [id number]  Subjective: The patient remains confused and intermittently combative due to her significant dementia.  Presently, she is resting comfortably in bed.  She tolerated sitting in a chair earlier today.   Objective: Vital signs in last 24 hours: Temp:  [95.8 F (35.4 C)-98.5 F (36.9 C)] 97.1 F (36.2 C) (12/10 0400) Pulse Rate:  [66-115] 73 (12/10 1300) Resp:  [11-30] 14 (12/10 1300) BP: (75-122)/(44-77) 82/67 (12/10 1300) SpO2:  [93 %-100 %] 99 % (12/10 1300) Weight:  [71.9 kg] 71.9 kg (12/10 0553)  Intake/Output from previous day: 12/09 0701 - 12/10 0700 In: 2467.3 [I.V.:1732.5; IV Piggyback:234.8] Out: 573 [Urine:473; Blood:100] Intake/Output this shift: Total I/O In: 575.2 [I.V.:575.2] Out: 175 [Urine:175]  Recent Labs    08/31/22 0817 09/02/22 0436  HGB 12.7 9.6*   Recent Labs    08/31/22 0817 09/02/22 0436  WBC 8.2 10.4  RBC 4.40 3.26*  HCT 40.0 29.8*  PLT 327 234   Recent Labs    08/31/22 0818 09/02/22 0436  NA 143 141  K 3.7 4.4  CL 110 111  CO2 26 26  BUN 19 42*  CREATININE 0.63 1.02*  GLUCOSE 97 132*  CALCIUM 9.1 8.2*   No results for input(s): "LABPT", "INR" in the last 72 hours.  Physical Exam: Orthopedic examination is limited to the left hip and lower extremity.  The left lower extremity is aligned symmetrically as compared to the right.  The wound appears to be healing well.  There is no significant swelling or erythema around the lateral aspect of the hip, and only minimal dried drainage on the dressing.  Grossly, she is neurovascularly intact to the left lower extremity and foot.  Assessment: Status post left hip hemiarthroplasty for displaced left femoral neck fracture.  Plan: The patient may be mobilized with physical therapy, weightbearing as tolerated to the left lower extremity, once cleared medically.  She may receive pain medication as deemed appropriate  from a medical standpoint.  She will continue to receive Lovenox for DVT prophylaxis.  The patient will require rehab placement upon discharge.   Excell Seltzer Tajuana Kniskern 09/02/2022, 2:28 PM

## 2022-09-03 ENCOUNTER — Encounter: Payer: Self-pay | Admitting: Internal Medicine

## 2022-09-03 DIAGNOSIS — F0150 Vascular dementia without behavioral disturbance: Secondary | ICD-10-CM

## 2022-09-03 DIAGNOSIS — Z7189 Other specified counseling: Secondary | ICD-10-CM | POA: Diagnosis not present

## 2022-09-03 DIAGNOSIS — Z515 Encounter for palliative care: Secondary | ICD-10-CM | POA: Diagnosis not present

## 2022-09-03 DIAGNOSIS — S72002A Fracture of unspecified part of neck of left femur, initial encounter for closed fracture: Secondary | ICD-10-CM | POA: Diagnosis not present

## 2022-09-03 LAB — BASIC METABOLIC PANEL
Anion gap: 5 (ref 5–15)
BUN: 35 mg/dL — ABNORMAL HIGH (ref 8–23)
CO2: 27 mmol/L (ref 22–32)
Calcium: 8.2 mg/dL — ABNORMAL LOW (ref 8.9–10.3)
Chloride: 110 mmol/L (ref 98–111)
Creatinine, Ser: 0.66 mg/dL (ref 0.44–1.00)
GFR, Estimated: >60 mL/min (ref >60)
Glucose, Bld: 99 mg/dL (ref 70–99)
Potassium: 3.7 mmol/L (ref 3.5–5.1)
Sodium: 142 mmol/L (ref 135–145)

## 2022-09-03 LAB — CBC
HCT: 29 % — ABNORMAL LOW (ref 36.0–46.0)
Hemoglobin: 9.1 g/dL — ABNORMAL LOW (ref 12.0–15.0)
MCH: 29.3 pg (ref 26.0–34.0)
MCHC: 31.4 g/dL (ref 30.0–36.0)
MCV: 93.2 fL (ref 80.0–100.0)
Platelets: 206 10*3/uL (ref 150–400)
RBC: 3.11 MIL/uL — ABNORMAL LOW (ref 3.87–5.11)
RDW: 15.1 % (ref 11.5–15.5)
WBC: 7 10*3/uL (ref 4.0–10.5)
nRBC: 0 % (ref 0.0–0.2)

## 2022-09-03 LAB — MAGNESIUM: Magnesium: 2.3 mg/dL (ref 1.7–2.4)

## 2022-09-03 MED ORDER — SENNOSIDES-DOCUSATE SODIUM 8.6-50 MG PO TABS: 2.0000 | ORAL_TABLET | Freq: Two times a day (BID) | ORAL | Status: DC

## 2022-09-03 MED ORDER — DEXMEDETOMIDINE HCL IN NACL 400 MCG/100ML IV SOLN
0.2000 ug/kg/h | INTRAVENOUS | Status: DC
  Administered 2022-09-03: 0.2 ug/kg/h via INTRAVENOUS
  Administered 2022-09-03: 0.7 ug/kg/h via INTRAVENOUS
  Filled 2022-09-03 (×3): qty 100

## 2022-09-03 MED ORDER — ZIPRASIDONE MESYLATE 20 MG IM SOLR: 10.0000 mg | Freq: Four times a day (QID) | INTRAMUSCULAR | Status: DC | PRN

## 2022-09-03 MED ORDER — SODIUM CHLORIDE 0.9 % IV SOLN
25.0000 mg | Freq: Once | INTRAVENOUS | Status: DC
  Filled 2022-09-03: qty 1

## 2022-09-03 MED ORDER — HALOPERIDOL LACTATE 5 MG/ML IJ SOLN
5.0000 mg | Freq: Four times a day (QID) | INTRAMUSCULAR | Status: DC | PRN
  Administered 2022-09-03: 5 mg via INTRAVENOUS
  Filled 2022-09-03: qty 1

## 2022-09-03 NOTE — Progress Notes (Signed)
  Subjective: 2 Days Post-Op Procedure(s) (LRB): ARTHROPLASTY BIPOLAR HIP (HEMIARTHROPLASTY) (Left) Patient is sleeping well this morning. Sitter in the room.  Patient with dementia and aggression yesterday. Sitter states this is improved this morning.  Objective: Vital signs in last 24 hours: Temp:  [97.4 F (36.3 C)-98.8 F (37.1 C)] 98.8 F (37.1 C) (12/11 0400) Pulse Rate:  [57-83] 69 (12/11 0700) Resp:  [11-33] 15 (12/11 0700) BP: (82-134)/(48-103) 116/60 (12/11 0700) SpO2:  [90 %-100 %] 100 % (12/11 0700) Weight:  [73.3 kg] 73.3 kg (12/11 0547)  Intake/Output from previous day:  Intake/Output Summary (Last 24 hours) at 09/03/2022 0755 Last data filed at 09/03/2022 0603 Gross per 24 hour  Intake 1604.5 ml  Output 525 ml  Net 1079.5 ml    Intake/Output this shift: No intake/output data recorded.  Labs: Recent Labs    08/31/22 0817 09/02/22 0436 09/03/22 0310  HGB 12.7 9.6* 9.1*   Recent Labs    09/02/22 0436 09/03/22 0310  WBC 10.4 7.0  RBC 3.26* 3.11*  HCT 29.8* 29.0*  PLT 234 206   Recent Labs    09/02/22 0436 09/03/22 0310  NA 141 142  K 4.4 3.7  CL 111 110  CO2 26 27  BUN 42* 35*  CREATININE 1.02* 0.66  GLUCOSE 132* 99  CALCIUM 8.2* 8.2*   No results for input(s): "LABPT", "INR" in the last 72 hours.   EXAM General - Patient is sleeping well without any visual signs of pain.  Safety mittens intact. Extremity -  Left leg is soft to palpation around the thigh. Dressing/Incision - Mild bloody drainage noted along the distal aspect of the honeycomb incision.  Past Medical History:  Diagnosis Date   Allergic rhinitis    Depressive disorder, not elsewhere classified    Hearing loss    bilateral hearing aides   History of MRI 9/10   Cardiac MRI, normal   Hyperlipidemia    Hypertension    Hypothyroidism    Insomnia, unspecified    Osteopenia    Paroxysmal A-fib (HCC) 2009   [facility]   Teeth grinding    Unspecified vitamin D deficiency      Assessment/Plan: 2 Days Post-Op Procedure(s) (LRB): ARTHROPLASTY BIPOLAR HIP (HEMIARTHROPLASTY) (Left) Principal Problem:   Left displaced femoral neck fracture (HCC) Active Problems:   Paroxysmal atrial fibrillation (HCC)   Hypothyroidism   Vascular dementia without behavioral disturbance (HCC)   Fall  Estimated body mass index is 27.74 kg/m as calculated from the following:   Height as of this encounter: 5\' 4"  (1.626 m).   Weight as of this encounter: 73.3 kg.  Vitals and labs reviewed this AM. WBC 7.0, Hg 9.1 this AM. No signs of infection to the left hip. Up with therapy if able to tolerate.  DVT Prophylaxis - Lovenox Weight-Bearing as tolerated to left leg  J. , PA-C [facility] Orthopaedic Surgery 09/03/2022, 7:55 AM

## 2022-09-03 NOTE — Consult Note (Addendum)
Consultation Note Date: 09/03/2022   Patient Name: Karen Colon  DOB: 12/10/1925  MRN: [id number]  Age / Sex: 88 y.o., female  PCP: Karen Mealing, MD Referring Physician: Darlin Priestly, MD  Reason for Consultation: Establishing goals of care  HPI/Patient Profile: 88 y.o. female  with past medical history of advanced dementia- wheelchair-bound but can transfer, HTN/HLD, osteopenia, hypothyroid, depression, profound hearing loss admitted on 08/31/2022 with left hip fracture with surgical repair 09/01/2022.   Clinical Assessment and Goals of Care: I have reviewed medical records including EPIC notes, labs and imaging, received report from RN, assessed the patient.  Karen Colon is lying quietly in bed with her eyes closed.  She has profound hearing loss and does not interact with me in any meaningful way.  Her daughter Karen Colon, and granddaughter are present at bedside.    We meet at the bedside to discuss diagnosis prognosis, GOC, EOL wishes, disposition and options. I introduced Palliative Medicine as specialized medical care for people living with serious illness. It focuses on providing relief from the symptoms and stress of a serious illness. The goal is to improve quality of life for both the patient and the family.  We focused on their current illness.  And shares that they chose to have hip repair in order to reduce Karen Colon's overall pain and discomfort.  They share that they have been in contact with hospice/[facility] and she will return to her long-term care facility under hospice care.  The natural disease trajectory and expectations at EOL were discussed.  Advanced directives, concepts specific to code status, artifical feeding and hydration, and rehospitalization were considered and discussed.  DNR confirmed.  Hospice and Palliative Care services outpatient were discussed.  Daughter, and, has reached down to [facility]  and is requesting outpatient hospice services to follow at [facility] upon discharge.  [facility] liaison aware and has been in contact with family.  Discussed the importance of continued conversation with family and the medical providers regarding overall plan of care and treatment options, ensuring decisions are within the context of the patient's values and GOCs.  Questions and concerns were addressed.  The family was encouraged to call with questions or concerns.  PMT will continue to support holistically.  Conference with attending, bedside nursing staff, Karen Colon representative, transition of care team related to patient condition, needs, goals of care, disposition.   HCPOA NEXT OF KIN -daughter, Karen Colon.    SUMMARY OF RECOMMENDATIONS   At this point continue to treat the treatable but no CPR or intubation Return to long-term care/[facility], with the benefits of [facility] hospice   Code Status/Advance Care Planning: DNR  Symptom Management:  Per hospitalist/orthopedist, no additional needs at this time.  Palliative Prophylaxis:  Bowel Regimen, Delirium Protocol, Frequent Pain Assessment, and Turn Reposition  Additional Recommendations (Limitations, Scope, Preferences): Treat the treatable but no CPR or intubation  Psycho-social/Spiritual:  Desire for further Chaplaincy support:no Additional Recommendations: Caregiving  Support/Resources and Education on Hospice  Prognosis:  Unable to determine, guarded at this  point.  Less than 6 months anticipated secondary to hip fracture in dementia.  Discharge Planning: Return to [facility], LTC with [facility] hospice.       Primary Diagnoses: Present on Admission:  Left displaced femoral neck fracture (HCC)  Fall  Paroxysmal atrial fibrillation (HCC)  Hypothyroidism  Vascular dementia without behavioral disturbance (HCC)   I have reviewed the medical record, interviewed the patient and family, and examined the patient. The following aspects are  pertinent.  Past Medical History:  Diagnosis Date   Allergic rhinitis    Depressive disorder, not elsewhere classified    Hearing loss    bilateral hearing aides   History of MRI 9/10   Cardiac MRI, normal   Hyperlipidemia    Hypertension    Hypothyroidism    Insomnia, unspecified    Osteopenia    Paroxysmal A-fib (HCC) 2009   [facility]   Teeth grinding    Unspecified vitamin D deficiency    Social History   Socioeconomic History   Marital status: Married    Spouse name: Not on file   Number of children: 2   Years of education: Not on file   Highest education level: Not on file  Occupational History   Occupation: High school teacher    Comment: Retired  Tobacco Use   Smoking status: Never   Smokeless tobacco: Never  Substance and Sexual Activity   Alcohol use: Yes    Alcohol/week: 1.0 standard drink of alcohol    Types: 1 Glasses of wine per week    Comment: 1 glass of wine per night   Drug use: No   Sexual activity: Not on file  Other Topics Concern   Not on file  Social History Narrative   Son and daughter      Has living will   Daughter is health care POA   Has DNR ---confirmed and rewritten 02/07/17   No tube    [employer]   Social Determinants of Health   Financial Resource Strain: Not on file  Food Insecurity: Not on file  Transportation Needs: Not on file  Physical Activity: Not on file  Stress: Not on file  Social Connections: Not on file   Family History  Problem Relation Age of Onset   Heart disease Mother    Pneumonia Father    Scheduled Meds:  Chlorhexidine Gluconate Cloth  6 each Topical Q0600   diltiazem  120 mg Oral Daily   docusate sodium  100 mg Oral BID   enoxaparin (LOVENOX) injection  40 mg Subcutaneous Q24H   levothyroxine  75 mcg Oral QAC breakfast   polyethylene glycol  17 g Oral Daily   psyllium  1 packet Oral Daily   senna  1 tablet Oral BID   Continuous Infusions:  lactated ringers 50 mL/hr at 09/03/22 1302    methocarbamol (ROBAXIN) IV     PRN Meds:.acetaminophen, bisacodyl, diphenhydrAMINE, haloperidol lactate, HYDROcodone-acetaminophen, magnesium hydroxide, methocarbamol **OR** methocarbamol (ROBAXIN) IV, metoCLOPramide **OR** metoCLOPramide (REGLAN) injection, morphine injection, ondansetron, ziprasidone Medications Prior to Admission:  Prior to Admission medications   Medication Sig Start Date End Date Taking? Authorizing Provider  aspirin 81 MG chewable tablet Chew 81 mg by mouth daily.   Yes [provider]  diltiazem (CARDIZEM CD) 120 MG 24 hr capsule Take 1 capsule (120 mg total) by mouth daily. 11/16/17  Yes Auburn Bilberry, MD  levothyroxine (SYNTHROID) 75 MCG tablet Take 75 mcg by mouth daily before breakfast.   Yes [provider]  polyethylene glycol (MIRALAX / GLYCOLAX) 17 g packet Take 17 g by mouth daily.   Yes [provider]  diclofenac Sodium (VOLTAREN) 1 % GEL Apply 4 g topically every 6 (six) hours as needed.    [provider]  psyllium (METAMUCIL) 58.6 % packet Take 1 packet by mouth daily.    [provider]   Allergies  Allergen Reactions   Ciprofloxacin     Muscle spasm    Iodinated Contrast Media     Iodine DYe   Other     Dust Mites   Review of Systems  Unable to perform ROS: Dementia    Physical Exam Vitals and nursing note reviewed.     Vital Signs: BP 130/73   Pulse 72   Temp 98.9 F (37.2 C) (Axillary)   Resp 16   Ht 5\' 4"  (1.626 m)   Wt 73.3 kg   SpO2 100%   BMI 27.74 kg/m  Pain Scale: 0-10   Pain Score: Asleep   SpO2: SpO2: 100 % O2 Device:SpO2: 100 % O2 Flow Rate: .O2 Flow Rate (L/min): 2 L/min  IO: Intake/output summary:  Intake/Output Summary (Last 24 hours) at 09/03/2022 1342 Last data filed at 09/03/2022 1328 Gross per 24 hour  Intake 1459.71 ml  Output 745 ml  Net 714.71 ml    LBM:   Baseline Weight: Weight: 73.8 kg Most recent weight: Weight: 73.3 kg     Palliative  Assessment/Data:   Flowsheet Rows    Flowsheet Row Most Recent Value  Intake Tab   Referral Department Hospitalist  Unit at Time of Referral Med/Surg Unit  Palliative Care Primary Diagnosis Other (Comment)  Date Notified 08/31/22  Palliative Care Type New Palliative care  Reason for referral Clarify Goals of Care  Date of Admission 08/31/22  Date first seen by Palliative Care 09/03/22  # of days Palliative referral response time 3 Day(s)  # of days IP prior to Palliative referral 0  Clinical Assessment   Palliative Performance Scale Score 20%  Pain Max last 24 hours Not able to report  Pain Min Last 24 hours Not able to report  Dyspnea Max Last 24 Hours Not able to report  Dyspnea Min Last 24 hours Not able to report  Psychosocial & Spiritual Assessment   Palliative Care Outcomes        Time In: 1300 Time Out: 1415  Time Total: 75 minutes  Greater than 50%  of this time was spent counseling and coordinating care related to the above assessment and plan.  Signed by: 14/11/23, NP   Please contact Palliative Medicine Team phone at [phone] for questions and concerns.  For individual provider: See [phone]

## 2022-09-03 NOTE — Progress Notes (Signed)
PROGRESS NOTE    Karen Colon  TIW:[id number] DOB: 1926-02-14 DOA: 08/31/2022 PCP: Earnestine Mealing, MD  [id number]  LOS: 3 days   Brief hospital course:   Assessment & Plan: Karen Colon is a 88 y.o. female with medical history significant for advanced dementia, nonambulatory (wheelchair-bound ), hypertension, hypothyroidism, depression, hearing loss who was brought into the ER by EMS for evaluation following an unwitnessed fall at [facility].  Patient was said to have been found on the floor in the bathroom.  She had complaints of left leg pain and her left leg was noted to be shortened and outwardly rotated.    * Left displaced femoral neck fracture (HCC) S/p left HEMIARTHROPLASTY on 09/01/22. --PT/OT --pain control --Lovenox for DVT prophylaxis   Acute hypoxemic respiratory failure --post-op, pt was sating 80% on non-rebreather.  Unclear etiology.  CXR no acute finding.   --Discussed with daughter, who said pt would not want to be mech ventilated.   --transferred to stepdown for precedex gtt in order to keep supplemental oxygen on, since pt was found to pull off her oxygen. Plan: --Continue supplemental O2 to keep sats >=90%, wean as tolerated   Fall Status post unwitnessed fall with a left femoral neck fracture   Acute delirium Vascular dementia without behavioral disturbance (HCC) Patient with a history of advanced dementia and is wheelchair-bound --pt is agitated, confused, pulling everything off, and aggressive towards nursing staff.  IV haldol didn't work Plan: --cont precedex gtt for rest, comfort and sedation, per daughter's request  Hypothyroidism Continue Synthroid   Paroxysmal atrial fibrillation (HCC) Not on long-term anticoagulation due to high risk for falls --cont cardizem  AKI --Cr went from 0.63 to 1.02 this morning. --improved with gentle IVF   DVT prophylaxis: Lovenox SQ Code Status: DNR no intubation  Family Communication: daughter updated  on the phone today Level of care: Stepdown Dispo:   The patient is from: SNF LTC Anticipated d/c is to: SNF LTC Anticipated d/c date is: undetermined   Subjective and Interval History:  O2 sats in low 90's in room air while pt was sleeping this morning.  Karen Colon removed and attempted to d/c precedex gtt, however, pt became really agitated, pulling off everything.  Per daughter's wish, she would like pt to be sedated for 1-2 more days on precedex gtt with goal of rest and comfort, and is not interested in early mobility to regain function.  Daughter agreed to restraint in order to keep IV in for the precedex gtt.   Objective: Vitals:   09/03/22 0600 09/03/22 0700 09/03/22 0800 09/03/22 0900  BP: 121/64 116/60 (!) 125/59 130/73  Pulse: 68 69 70 72  Resp: 14 15 16    Temp:   98.9 F (37.2 C)   TempSrc:   Axillary   SpO2: 98% 100% 96% 100%  Weight:      Height:        Intake/Output Summary (Last 24 hours) at 09/03/2022 1804 Last data filed at 09/03/2022 1328 Gross per 24 hour  Intake 1082.82 ml  Output 745 ml  Net 337.82 ml   Filed Weights   08/31/22 0810 09/02/22 0553 09/03/22 0547  Weight: 73.8 kg 71.9 kg 73.3 kg    Examination: this morning  Constitutional: NAD, sleepy but arousable, not oriented CV: No cyanosis.   RESP: normal respiratory effort, on RA SKIN: warm, dry   Data Reviewed: I have personally reviewed labs and imaging studies  Time spent: 50 minutes  14/11/23, MD [facility] If  7PM-7AM, please contact night-coverage 09/03/2022, 6:04 PM

## 2022-09-03 NOTE — Progress Notes (Signed)
Physical Therapy Treatment Patient Details Name: Karen Colon MRN: [id number] DOB: December 18, 1925 Today's Date: 09/03/2022   History of Present Illness Pt is a 87 y/o F admitted on 08/31/22 after presenting to the ER for evaluation following an unwitnessed fall. Pt noted to have L displaced femoral neck fx & underwent L hip unipolar hemiarthroplasty on 09/01/22. PMH: advanced dementia, nonambulatory (w/c bound), HTN, hypothyroidism, depression, hearing loss    PT Comments    Pt seen for PT tx with pt received in bed. Pt keeps eyes closed throughout majority of session despite pt verbally engaging with PT. Pt requires total assist for supine<>sit & +2 assist to scoot up in bed. Pt tolerates sitting EOB with close supervision but poor sitting balance with pt leaning to R on bed rail. Attempted multiple ways to assist pt to recliner but pt unable to achieve clearing buttocks from EOB despite max assist & pt ultimately assisted back to bed. Pt's daughter Dewayne Hatch) arrived in room & asking about PT intervention with PT explaining pt will likely have limited recovery 2/2 advanced dementia. Ann requests no PT at this time 2/2 pt's dementia, HOH & being on multiple medications. PT to complete current orders per family wishes -- MD notified.  Pt left with LUE mitten on, daughter requesting R mitten stay off to allow her to hold mother's hand. Pt required max encouragement/education to keep nasal cannula donned throughout session.    Recommendations for follow up therapy are one component of a multi-disciplinary discharge planning process, led by the attending physician.  Recommendations may be updated based on patient status, additional functional criteria and insurance authorization.  Follow Up Recommendations  Long-term institutional care without follow-up therapy Can patient physically be transported by private vehicle: No   Assistance Recommended at Discharge Frequent or constant Supervision/Assistance   Patient can return home with the following Two people to help with bathing/dressing/bathroom;Two people to help with walking and/or transfers;Direct supervision/assist for medications management;Help with stairs or ramp for entrance;Assist for transportation;Assistance with feeding;Assistance with cooking/housework;Direct supervision/assist for financial management   Equipment Recommendations  Hospital bed (hoyer lift & sling)    Recommendations for Other Services       Precautions / Restrictions Precautions Precautions: Fall;Posterior Hip Restrictions Weight Bearing Restrictions: Yes LLE Weight Bearing: Weight bearing as tolerated     Mobility  Bed Mobility Overal bed mobility: Needs Assistance Bed Mobility: Supine to Sit, Sit to Supine     Supine to sit: Total assist, +2 for physical assistance Sit to supine: Total assist, +2 for physical assistance        Transfers                        Ambulation/Gait                   Stairs             Wheelchair Mobility    Modified Rankin (Stroke Patients Only)       Balance Overall balance assessment: Needs assistance Sitting-balance support: Feet supported, Bilateral upper extremity supported Sitting balance-Leahy Scale: Poor   Postural control: Posterior lean, Right lateral lean                                  Cognition Arousal/Alertness:  (pt keeps eyes closed throughout majority of session despite being awake & verbally interacting with PT)   Overall Cognitive Status:  History of cognitive impairments - at baseline                                 General Comments: Hx of dementia at baseline, HOH. Pt unable to follow simple one step commands throughout session, even as simple as "Look. Open eyes." etc.        Exercises      General Comments        Pertinent Vitals/Pain Pain Assessment Pain Assessment: Faces Faces Pain Scale: Hurts whole lot Pain  Location: L hip with movement Pain Descriptors / Indicators: Guarding, Grimacing, Discomfort Pain Intervention(s): Monitored during session, Repositioned    Home Living                          Prior Function            PT Goals (current goals can now be found in the care plan section) Acute Rehab PT Goals Patient Stated Goal: d/c from PT caseload at this time PT Goal Formulation: With family Time For Goal Achievement: 09/16/22 Progress towards PT goals:  (d/c from PT caseload 2/2 family's wishes)    Frequency           PT Plan Discharge plan needs to be updated    Co-evaluation              AM-PAC PT "6 Clicks" Mobility   Outcome Measure  Help needed turning from your back to your side while in a flat bed without using bedrails?: Total Help needed moving from lying on your back to sitting on the side of a flat bed without using bedrails?: Total Help needed moving to and from a bed to a chair (including a wheelchair)?: Total Help needed standing up from a chair using your arms (e.g., wheelchair or bedside chair)?: Total Help needed to walk in hospital room?: Total Help needed climbing 3-5 steps with a railing? : Total 6 Click Score: 6    End of Session Equipment Utilized During Treatment: Oxygen Activity Tolerance: Patient limited by pain (limited by decreased cognition) Patient left: in bed;with call bell/phone within reach;with bed alarm set;with family/visitor present Nurse Communication: Mobility status       Time: 4268-3419 PT Time Calculation (min) (ACUTE ONLY): 21 min  Charges:  $Therapeutic Activity: 8-22 mins                     Aleda Grana, PT, DPT 09/03/22, 9:56 AM   Sandi Mariscal 09/03/2022, 9:54 AM

## 2022-09-04 DIAGNOSIS — Z7189 Other specified counseling: Secondary | ICD-10-CM

## 2022-09-04 DIAGNOSIS — Z515 Encounter for palliative care: Secondary | ICD-10-CM | POA: Diagnosis not present

## 2022-09-04 DIAGNOSIS — S72002A Fracture of unspecified part of neck of left femur, initial encounter for closed fracture: Secondary | ICD-10-CM

## 2022-09-04 LAB — SURGICAL PATHOLOGY

## 2022-09-04 MED ORDER — DEXMEDETOMIDINE HCL IN NACL 400 MCG/100ML IV SOLN: 0.4000 ug/kg/h | INTRAVENOUS | Status: DC

## 2022-09-04 MED ORDER — SODIUM CHLORIDE 0.9 % IV SOLN: INTRAVENOUS | Status: DC

## 2022-09-04 MED ORDER — ONDANSETRON 4 MG PO TBDP: 4.0000 mg | ORAL_TABLET | Freq: Four times a day (QID) | ORAL | Status: DC | PRN

## 2022-09-04 MED ORDER — POLYVINYL ALCOHOL 1.4 % OP SOLN: 1.0000 [drp] | Freq: Four times a day (QID) | OPHTHALMIC | Status: DC | PRN

## 2022-09-04 MED ORDER — GLYCOPYRROLATE 0.2 MG/ML IJ SOLN: 0.2000 mg | INTRAMUSCULAR | Status: DC | PRN

## 2022-09-04 MED ORDER — MORPHINE SULFATE (CONCENTRATE) 10 MG/0.5ML PO SOLN: 2.6000 mg | ORAL | Status: DC | PRN

## 2022-09-04 MED ORDER — ONDANSETRON HCL 4 MG/2ML IJ SOLN: 4.0000 mg | Freq: Four times a day (QID) | INTRAMUSCULAR | Status: DC | PRN

## 2022-09-04 MED ORDER — HALOPERIDOL LACTATE 5 MG/ML IJ SOLN: 0.5000 mg | INTRAMUSCULAR | Status: DC | PRN

## 2022-09-04 MED ORDER — ACETAMINOPHEN 650 MG RE SUPP: 650.0000 mg | Freq: Four times a day (QID) | RECTAL | Status: DC | PRN

## 2022-09-04 MED ORDER — HALOPERIDOL 0.5 MG PO TABS: 0.5000 mg | ORAL_TABLET | ORAL | Status: DC | PRN

## 2022-09-04 MED ORDER — BIOTENE DRY MOUTH MT LIQD: 15.0000 mL | OROMUCOSAL | Status: DC | PRN

## 2022-09-04 MED ORDER — HALOPERIDOL LACTATE 2 MG/ML PO CONC: 0.5000 mg | ORAL | Status: DC | PRN

## 2022-09-04 MED ORDER — LORAZEPAM 2 MG/ML PO CONC
1.0000 mg | ORAL | Status: DC
  Administered 2022-09-04: 1 mg via ORAL

## 2022-09-04 MED ORDER — LORAZEPAM 2 MG/ML PO CONC
2.0000 mg | ORAL | Status: DC
  Administered 2022-09-04 – 2022-09-05 (×2): 2 mg via ORAL
  Filled 2022-09-04 (×2): qty 1

## 2022-09-04 MED ORDER — ACETAMINOPHEN 325 MG PO TABS: 650.0000 mg | ORAL_TABLET | Freq: Four times a day (QID) | ORAL | Status: DC | PRN

## 2022-09-04 MED ORDER — LORAZEPAM 2 MG/ML PO CONC
1.0000 mg | ORAL | Status: DC | PRN
  Administered 2022-09-04: 1 mg via ORAL
  Filled 2022-09-04: qty 1

## 2022-09-04 MED ORDER — LORAZEPAM 2 MG/ML PO CONC: 1.0000 mg | ORAL | Status: DC | PRN

## 2022-09-04 MED ORDER — GLYCOPYRROLATE 1 MG PO TABS: 1.0000 mg | ORAL_TABLET | ORAL | Status: DC | PRN

## 2022-09-04 NOTE — TOC Initial Note (Signed)
Transition of Care [facility]) - Initial/Assessment Note    Patient Details  Name: Karen Colon MRN: [id number] Date of Birth: Jul 27, 1926  Transition of Care [facility]) CM/SW Contact:    Shelbie Hutching, RN Phone Number: 09/04/2022, 2:16 PM  Clinical Narrative:                 Patient admitted to the hospital with left displaced femoral neck fracture.  Patient is from [facility] long term care skilled nursing.  Patient's daughter, Lelon Frohlich, at the bedside.  Lelon Frohlich is wanting patient to return to [facility] under hospice care with [facility].  Patient is currently on a Precidex infusion that will need to come off before patient can be transferred but daughter wants to ensure that patient will be comfortable and that her agitation will be managed.  Palliative NP working with medications to try and wean predicex off.  Hopefully patient will be able to discharge to [facility] tomorrow under hospice care.  Seth Bake with [facility] notified of potential for DC tomorrow.   Expected Discharge Plan: [place] (with hospice) Barriers to Discharge: Continued Medical Work up   Patient Goals and CMS Choice Patient states their goals for this hospitalization and ongoing recovery are:: daughter wants patient to return to [facility] with hospice CMS Medicare.gov Compare Post Acute Care list provided to:: Patient Represenative (must comment) Choice offered to / list presented to : [place] / Guardian  Expected Discharge Plan and Services Expected Discharge Plan: [place] (with hospice)   Discharge Planning Services: CM Consult Post Acute Care Choice: Resumption of Svcs/PTA Provider, Hospice Living arrangements for the past 2 months: [place]                 DME Arranged: N/A DME Agency: NA       HH Arranged: NA HH Agency: NA        Prior Living Arrangements/Services Living arrangements for the past 2 months: [place] Lives with:: Facility  Resident Patient language and need for interpreter reviewed:: Yes Do you feel safe going back to the place where you live?: Yes      Need for Family Participation in Patient Care: Yes (Comment) Care giver support system in place?: Yes (comment)   Criminal Activity/Legal Involvement Pertinent to Current Situation/Hospitalization: No - Comment as needed  Activities of Daily Living      Permission Sought/Granted Permission sought to share information with : Case Manager, Customer service manager Permission granted to share information with : Yes, Verbal Permission Granted  Share Information with NAME: Chesnie Pressman  Permission granted to share info w AGENCY: [place] granted to share info w Relationship: daughter  Permission granted to share info w Contact Information: [phone]  Emotional Assessment Appearance:: Appears stated age Attitude/Demeanor/Rapport: Unable to Assess Affect (typically observed): Unable to Assess   Alcohol / Substance Use: Not Applicable Psych Involvement: No (comment)  Admission diagnosis:  Femoral neck fracture ([place]) [S72.009A] Fall, initial encounter [W19.XXXA] Closed displaced fracture of left femoral neck ([place]) [S72.002A] Patient Active Problem List   Diagnosis Date Noted   Left displaced femoral neck fracture ([place]) 08/31/2022   Pelvic fracture ([place]) 08/10/2022   Rib fracture 04/18/2021   Fall 04/18/2021   Concussion 04/18/2021   Insomnia 11/03/2020   Vascular dementia without behavioral disturbance ([place]) 06/25/2012   Allergic rhinitis 06/09/2012   Hypothyroidism 12/04/2011   Osteoporosis 12/04/2011   Paroxysmal atrial fibrillation ([place]) 06/12/2011  PCP:  Earnestine Mealing, MD Pharmacy:   [facility] [phone] [place]oles Rough, [facility] - [address] [place] [address] Phone: [phone] Fax: [phone]  [facility] [place], [place] - [address] [place] [address] Phone: [phone] Fax: [phone]     Social Determinants of Health (SDOH) Interventions    Readmission Risk Interventions     No data to display

## 2022-09-04 NOTE — Progress Notes (Addendum)
Palliative:   Karen Colon is lying quietly in bed.  She appears acutely/chronically ill and somewhat frail.  She has advanced dementia and profound hearing loss and does not respond to questions.  She will briefly make but not keep eye contact.  She cannot make her basic needs known.  There is no family at bedside at this time.  She has been impulsive, pulling at lines and drains.  Precedex drip started by attending.  There is a Comptroller present for safety.  I returned in the afternoon to find daughter/HCPOA, Karen Colon, present at bedside.  She endorses full comfort care.  We talked about scheduling medications for comfort.  Karen Colon is agreeable.  End-of-life order set implemented.  Conference with attending, bedside nursing staff, inpatient hospice representative, transition of care team related to patient condition, needs, goals of care, disposition.  Addendum: Daughter shares with bedside nursing staff that she would prefer that her mother have comfort care here in the hospital and die here.  Unfortunately, there is no palliative/hospice unit in [facility].  She is stable for transport.  [facility] hospice liaison meets with and to discuss inpatient hospice at residential hospice facility.  Referral made for inpatient hospice.  Plan:   Full comfort care.  End-of-life order set implemented.  Scheduled sublingual Ativan.  Return to [facility] tomorrow under hosp[facility] care with [facility].  55 minutes  Karen Carmel, NP Palliative medicine team Team phone [phone] Greater than 50% of this time was spent counseling and coordinating care related to the above assessment and plan.

## 2022-09-04 NOTE — Progress Notes (Signed)
Subjective: 3 Days Post-Op Procedure(s) (LRB): ARTHROPLASTY BIPOLAR HIP (HEMIARTHROPLASTY) (Left) Patient is sleeping well this morning. Sitter in the room.  Palliative care has been consulted Sitter states she has not had any issues this morning.  Objective: Vital signs in last 24 hours: Temp:  [97.8 F (36.6 C)-98.2 F (36.8 C)] 98 F (36.7 C) (12/12 0800) Pulse Rate:  [59-123] 69 (12/12 0900) Resp:  [11-30] 18 (12/12 0900) BP: (112-157)/(52-109) 157/83 (12/12 0900) SpO2:  [88 %-100 %] 93 % (12/12 0900)  Intake/Output from previous day:  Intake/Output Summary (Last 24 hours) at 09/04/2022 1119 Last data filed at 09/04/2022 0600 Gross per 24 hour  Intake 396.43 ml  Output 555 ml  Net -158.57 ml    Intake/Output this shift: No intake/output data recorded.  Labs: Recent Labs    09/02/22 0436 09/03/22 0310  HGB 9.6* 9.1*   Recent Labs    09/02/22 0436 09/03/22 0310  WBC 10.4 7.0  RBC 3.26* 3.11*  HCT 29.8* 29.0*  PLT 234 206   Recent Labs    09/02/22 0436 09/03/22 0310  NA 141 142  K 4.4 3.7  CL 111 110  CO2 26 27  BUN 42* 35*  CREATININE 1.02* 0.66  GLUCOSE 132* 99  CALCIUM 8.2* 8.2*   No results for input(s): "LABPT", "INR" in the last 72 hours.   EXAM General - Patient is sleeping well without any visual signs of pain.  Safety mittens have been removed. Extremity -  Left leg is soft to palpation around the thigh. Dressing/Incision - Mild bloody drainage noted along the distal aspect of the honeycomb incision.  No signs of infection.  Past Medical History:  Diagnosis Date   Allergic rhinitis    Depressive disorder, not elsewhere classified    Hearing loss    bilateral hearing aides   History of MRI 9/10   Cardiac MRI, normal   Hyperlipidemia    Hypertension    Hypothyroidism    Insomnia, unspecified    Osteopenia    Paroxysmal A-fib (HCC) 2009   [facility]   Teeth grinding    Unspecified vitamin D deficiency     Assessment/Plan: 3 Days  Post-Op Procedure(s) (LRB): ARTHROPLASTY BIPOLAR HIP (HEMIARTHROPLASTY) (Left) Principal Problem:   Left displaced femoral neck fracture (HCC) Active Problems:   Paroxysmal atrial fibrillation (HCC)   Hypothyroidism   Vascular dementia without behavioral disturbance (HCC)   Fall  Estimated body mass index is 27.74 kg/m as calculated from the following:   Height as of this encounter: 5\' 4"  (1.626 m).   Weight as of this encounter: 73.3 kg.  Vitals reviewed this AM. No signs of infection to the left hip. Palliative care has been consulted.  Staple can be removed on 09/15/22  DVT Prophylaxis - Lovenox Weight-Bearing as tolerated to left leg  J. 09/17/22, PA-C [facility] 09/04/2022, 11:19 AM

## 2022-09-04 NOTE — TOC Progression Note (Signed)
Transition of Care [facility]) - Progression Note    Patient Details  Name: Karen Colon MRN: [id number] Date of Birth: 1926-05-29  [facility]) CM/SW Contact  Allayne Butcher, RN Phone Number: 09/04/2022, 4:29 PM  Clinical Narrative:    Patient's daughter is interested in hospice medical facility.  [facility] is following for referral to inpatient hospital.     Expected Discharge Plan: Hospice Medical Facility Barriers to Discharge: Continued Medical Work up  Expected Discharge Plan and Services Expected Discharge Plan: Hospice Medical Facility   Discharge Planning Services: CM Consult Post Acute Care Choice: Resumption of Svcs/PTA Provider, Hospice Living arrangements for the past 2 months: Skilled Nursing Facility                 DME Arranged: N/A DME Agency: NA       HH Arranged: NA HH Agency: NA         Social Determinants of Health (SDOH) Interventions    Readmission Risk Interventions     No data to display

## 2022-09-04 NOTE — [place] FL2 (Signed)
[place] MEDICAID FL2 LEVEL OF CARE FORM     IDENTIFICATION  Patient Name: Karen Colon Birthdate: 31-Jul-1926 Sex: female Admission Date (Current Location): 08/31/2022  [facility] and [place] Number:  Chiropodist and Address:  [facility], [address] [place], [place] [address]      Provider Number: [id number]  Attending Physician Name and Address:  Darlin Priestly, MD  Relative Name and Phone Number:  Vear Staton- daughter- [phone]    Current Level of Care: Hospital Recommended Level of Care: Skilled Nursing Facility Prior Approval Number:    Date Approved/Denied:   PASRR Number:    Discharge Plan: SNF    Current Diagnoses: Patient Active Problem List   Diagnosis Date Noted   Left displaced femoral neck fracture (HCC) 08/31/2022   Pelvic fracture (HCC) 08/10/2022   Rib fracture 04/18/2021   Fall 04/18/2021   Concussion 04/18/2021   Insomnia 11/03/2020   Vascular dementia without behavioral disturbance (HCC) 06/25/2012   Allergic rhinitis 06/09/2012   Hypothyroidism 12/04/2011   Osteoporosis 12/04/2011   Paroxysmal atrial fibrillation (HCC) 06/12/2011    Orientation RESPIRATION BLADDER Height & Weight        O2 ([place] 2L) Incontinent Weight: 73.3 kg Height:  5\' 4"  (162.6 cm)  BEHAVIORAL SYMPTOMS/MOOD NEUROLOGICAL BOWEL NUTRITION STATUS      Incontinent Diet (see discharge summary)  AMBULATORY STATUS COMMUNICATION OF NEEDS Skin   Total Care   Surgical wounds (incision left hip)                       Personal Care Assistance Level of Assistance  Total care       Total Care Assistance: Maximum assistance   Functional Limitations Info             SPECIAL CARE FACTORS FREQUENCY                       Contractures Contractures Info: Not present    Additional Factors Info  Code Status, Allergies Code Status Info: DNR Allergies Info: Cipro, Iodinated Contrast Media, dust mites           Current  Medications (09/04/2022):  This is the current hospital active medication list Current Facility-Administered Medications  Medication Dose Route Frequency Provider Last Rate Last Admin   0.9 %  sodium chloride infusion   Intravenous Continuous 14/08/2022, MD       acetaminophen (TYLENOL) tablet 650 mg  650 mg Oral Q6H PRN Darlin Priestly A, NP       Or   acetaminophen (TYLENOL) suppository 650 mg  650 mg Rectal Q6H PRN Dove, Tasha A, NP       antiseptic oral rinse (BIOTENE) solution 15 mL  15 mL Topical PRN Dove, Tasha A, NP       bisacodyl (DULCOLAX) suppository 10 mg  10 mg Rectal Daily PRN Poggi, Lillia Carmel, MD       dexmedetomidine (PRECEDEX) 400 MCG/100ML (4 mcg/mL) infusion  0.2-0.7 mcg/kg/hr Intravenous Continuous Dove, Tasha A, NP 12.83 mL/hr at 09/04/22 0600 0.7 mcg/kg/hr at 09/04/22 0600   diphenhydrAMINE (BENADRYL) 12.5 MG/5ML elixir 12.5-25 mg  12.5-25 mg Oral Q4H PRN Poggi, 10-24-1984, MD       glycopyrrolate (ROBINUL) tablet 1 mg  1 mg Oral Q4H PRN Dove, Tasha A, NP       Or   glycopyrrolate (ROBINUL) injection 0.2 mg  0.2 mg Subcutaneous Q4H PRN Excell Seltzer, NP  Or   glycopyrrolate (ROBINUL) injection 0.2 mg  0.2 mg Intravenous Q4H PRN Dove, Tasha A, NP       haloperidol (HALDOL) tablet 0.5 mg  0.5 mg Oral Q4H PRN Dove, Tasha A, NP       Or   haloperidol (HALDOL) 2 MG/ML solution 0.5 mg  0.5 mg Sublingual Q4H PRN Dove, Tasha A, NP       Or   haloperidol lactate (HALDOL) injection 0.5 mg  0.5 mg Intravenous Q4H PRN Dove, Tasha A, NP       LORazepam (ATIVAN) 2 MG/ML concentrated solution 1 mg  1 mg Oral Q4H Dove, Tasha A, NP       LORazepam (ATIVAN) 2 MG/ML concentrated solution 1 mg  1 mg Oral Q4H PRN Dove, Tasha A, NP       magnesium hydroxide (MILK OF MAGNESIA) suspension 30 mL  30 mL Oral Daily PRN Poggi, Excell Seltzer, MD       methocarbamol (ROBAXIN) tablet 500 mg  500 mg Oral Q6H PRN Poggi, Excell Seltzer, MD       Or   methocarbamol (ROBAXIN) 500 mg in dextrose 5 % 50 mL IVPB  500 mg  Intravenous Q6H PRN Poggi, Excell Seltzer, MD       morphine CONCENTRATE 10 MG/0.5ML oral solution 2.6-5 mg  2.6-5 mg Oral Q2H PRN Dove, Tasha A, NP       ondansetron (ZOFRAN-ODT) disintegrating tablet 4 mg  4 mg Oral Q6H PRN Dove, Tasha A, NP       Or   ondansetron (ZOFRAN) injection 4 mg  4 mg Intravenous Q6H PRN Dove, Tasha A, NP       polyvinyl alcohol (LIQUIFILM TEARS) 1.4 % ophthalmic solution 1 drop  1 drop Both Eyes QID PRN Dove, Tasha A, NP       senna-docusate (Senokot-S) tablet 2 tablet  2 tablet Oral BID Lillia Carmel A, NP         Discharge Medications: Please see discharge summary for a list of discharge medications.  Relevant Imaging Results:  Relevant Lab Results:   Additional Information SSN [id number]  Allayne Butcher, RN

## 2022-09-04 NOTE — Progress Notes (Signed)
PROGRESS NOTE    Karen Colon  [id number] DOB: Dec 18, 1925 DOA: 08/31/2022 PCP: Earnestine Mealing, MD  [id number]/[id number]-[id number]  LOS: 4 days   Brief hospital course:   Assessment & Plan: Karen Colon is a 88 y.o. female with medical history significant for advanced dementia, nonambulatory (wheelchair-bound ), hypertension, hypothyroidism, depression, hearing loss who was brought into the ER by EMS for evaluation following an unwitnessed fall at [facility].  Patient was said to have been found on the floor in the bathroom.  She had complaints of left leg pain and her left leg was noted to be shortened and outwardly rotated.    Comfort care status --After much discussions today, daughter elected for comfort care and discharge to hospice facility. --transition off precedex, and use concentrated oral soln of ativan and morphine for calming  * Left displaced femoral neck fracture (HCC) S/p left HEMIARTHROPLASTY on 09/01/22. --performed with the goal of pain control --pt could not cooperative with PT/OT  Acute hypoxemic respiratory failure, resolved --post-op, pt was sating 80% on non-rebreather.  Unclear etiology.  CXR no acute finding.   --Discussed with daughter, who said pt would not want to be mech ventilated.   --transferred to stepdown for precedex gtt in order to keep supplemental oxygen on, since pt was found to pull off her oxygen. --pt is now on room air   Fall Status post unwitnessed fall with a left femoral neck fracture   Acute delirium Vascular dementia without behavioral disturbance (HCC) Patient with a history of advanced dementia and is wheelchair-bound --pt is agitated, confused, pulling everything off, and aggressive towards nursing staff.  IV haldol didn't work.  Had needed precedex gtt. --transition off precedex gtt today, and use concentrated oral soln of ativan and morphine for calming  Hypothyroidism D/c Synthroid due to comfort care status   Paroxysmal atrial  fibrillation (HCC) Not on long-term anticoagulation due to high risk for falls --d/c cardizem due to comfort care status  AKI --Cr went from 0.63 to 1.02, improved with gentle IVF   DVT prophylaxis: Lovenox SQ Code Status: DNR no intubation  Family Communication: daughter updated at the bedside today Level of care: Stepdown Dispo:   The patient is from: SNF LTC Anticipated d/c is to: hospice facility Anticipated d/c date is: whenever bed available   Subjective and Interval History:  After much discussion, daughter elected for comfort care and discharge to hospice facility.   Objective: Vitals:   09/04/22 0800 09/04/22 0900 09/04/22 1000 09/04/22 1100  BP: 139/68 (!) 157/83 (!) 167/76 (!) 157/73  Pulse: (!) 59 69 79 61  Resp: 19 18 (!) 21 18  Temp: 98 F (36.7 C)     TempSrc: Axillary     SpO2: 97% 93% 93% 93%  Weight:      Height:        Intake/Output Summary (Last 24 hours) at 09/04/2022 1754 Last data filed at 09/04/2022 1500 Gross per 24 hour  Intake 250.09 ml  Output 600 ml  Net -349.91 ml   Filed Weights   08/31/22 0810 09/02/22 0553 09/03/22 0547  Weight: 73.8 kg 71.9 kg 73.3 kg    Examination:   Constitutional: NAD, alert, not oriented, confused HEENT: conjunctivae and lids normal, EOMI CV: No cyanosis.   RESP: normal respiratory effort, on RA Neuro: II - XII grossly intact.     Data Reviewed: I have personally reviewed labs and imaging studies  Time spent: 50 minutes  Darlin Priestly, MD [facility] If 7PM-7AM, please  contact night-coverage 09/04/2022, 5:54 PM

## 2022-09-05 DIAGNOSIS — Z7189 Other specified counseling: Secondary | ICD-10-CM | POA: Diagnosis not present

## 2022-09-05 DIAGNOSIS — S72002A Fracture of unspecified part of neck of left femur, initial encounter for closed fracture: Secondary | ICD-10-CM | POA: Diagnosis not present

## 2022-09-05 MED ORDER — GLYCOPYRROLATE 1 MG PO TABS: 1.0000 mg | ORAL_TABLET | ORAL | 0 refills | Status: AC | PRN

## 2022-09-05 MED ORDER — POLYVINYL ALCOHOL 1.4 % OP SOLN: 1.0000 [drp] | Freq: Four times a day (QID) | OPHTHALMIC | 0 refills | Status: AC | PRN

## 2022-09-05 MED ORDER — LORAZEPAM 2 MG/ML PO CONC: 2.0000 mg | ORAL | 0 refills | Status: AC

## 2022-09-05 MED ORDER — MORPHINE SULFATE (CONCENTRATE) 10 MG/0.5ML PO SOLN: 2.6000 mg | ORAL | 0 refills | Status: AC | PRN

## 2022-09-05 MED ORDER — HALOPERIDOL LACTATE 2 MG/ML PO CONC: 0.5000 mg | ORAL | 0 refills | Status: AC | PRN

## 2022-09-05 NOTE — Plan of Care (Signed)
  Problem: Clinical Measurements: Goal: Ability to maintain clinical measurements within normal limits will improve Outcome: Progressing   Problem: Elimination: Goal: Will not experience complications related to bowel motility Outcome: Progressing   Problem: Nutrition: Goal: Adequate nutrition will be maintained Outcome: Progressing   Problem: Pain Managment: Goal: General experience of comfort will improve Outcome: Progressing   Problem: Skin Integrity: Goal: Risk for impaired skin integrity will decrease Outcome: Progressing   Problem: Tissue Perfusion: Goal: Adequacy of tissue perfusion will improve Outcome: Progressing

## 2022-09-05 NOTE — Progress Notes (Signed)
Daily Progress Note   Patient Name: Karen Colon       Date: 09/05/2022 DOB: [birth year]  Age: 88 y.o. MRN#: [id number] Attending Physician: Enedina Finner, MD Primary Care Physician: Earnestine Mealing, MD Admit Date: 08/31/2022  Reason for Consultation/Follow-up: Terminal Care  Subjective: Request from colleague Marice Potter NP to check on patient to ensure symptom management.  Patient is currently resting in bed with eyes closed.  Even and unlabored respirations.  No distress noted; no changes to symptom management recommendations.  No family at bedside.  Plans for discharge to hospice facility.  Length of Stay: 5  Current Medications: Scheduled Meds:   LORazepam  2 mg Oral Q4H   senna-docusate  2 tablet Oral BID    Continuous Infusions:  dexmedetomidine (PRECEDEX) IV infusion Stopped (09/04/22 1853)   methocarbamol (ROBAXIN) IV      PRN Meds: acetaminophen **OR** acetaminophen, antiseptic oral rinse, bisacodyl, diphenhydrAMINE, glycopyrrolate **OR** glycopyrrolate **OR** glycopyrrolate, haloperidol **OR** haloperidol **OR** haloperidol lactate, LORazepam, magnesium hydroxide, methocarbamol **OR** methocarbamol (ROBAXIN) IV, morphine CONCENTRATE, ondansetron **OR** ondansetron (ZOFRAN) IV, polyvinyl alcohol  Physical Exam Constitutional:      Comments: Eyes closed.  No distress noted  Pulmonary:     Comments: Even and unlabored            Vital Signs: BP (!) 144/119 (BP Location: Left Arm)   Pulse (!) 113   Temp 98.9 F (37.2 C)   Resp 17   Ht 5\' 4"  (1.626 m)   Wt 73.3 kg   SpO2 99%   BMI 27.74 kg/m  SpO2: SpO2: 99 % O2 Device: O2 Device: Room Air O2 Flow Rate: O2 Flow Rate (L/min): 2 L/min  Intake/output summary:  Intake/Output Summary (Last 24 hours) at 09/05/2022 1034 Last  data filed at 09/05/2022 0550 Gross per 24 hour  Intake 84.6 ml  Output 700 ml  Net -615.4 ml   LBM:   Baseline Weight: Weight: 73.8 kg Most recent weight: Weight: 73.3 kg   Flowsheet Rows    Flowsheet Row Most Recent Value  Intake Tab   Referral Department Hospitalist  Unit at Time of Referral Med/Surg Unit  Palliative Care Primary Diagnosis Other (Comment)  Date Notified 08/31/22  Palliative Care Type New Palliative care  Reason for referral Clarify Goals of Care  Date of Admission 08/31/22  Date  first seen by Palliative Care 09/03/22  # of days Palliative referral response time 3 Day(s)  # of days IP prior to Palliative referral 0  Clinical Assessment   Palliative Performance Scale Score 20%  Pain Max last 24 hours Not able to report  Pain Min Last 24 hours Not able to report  Dyspnea Max Last 24 Hours Not able to report  Dyspnea Min Last 24 hours Not able to report  Psychosocial & Spiritual Assessment   Palliative Care Outcomes        Patient Active Problem List   Diagnosis Date Noted   Left displaced femoral neck fracture (HCC) 08/31/2022   Pelvic fracture (HCC) 08/10/2022   Rib fracture 04/18/2021   Fall 04/18/2021   Concussion 04/18/2021   Insomnia 11/03/2020   Vascular dementia without behavioral disturbance (HCC) 06/25/2012   Allergic rhinitis 06/09/2012   Hypothyroidism 12/04/2011   Osteoporosis 12/04/2011   Paroxysmal atrial fibrillation (HCC) 06/12/2011    Palliative Care Assessment & Plan     Recommendations/Plan: Patient appears comfortable at this time; no changes to symptom management recommended.  Patient is waiting for transfer to hospice facility     Code Status:    Code Status Orders  (From admission, onward)           Start     Ordered   09/04/22 1346  Do not attempt resuscitation (DNR)  Continuous       Question Answer Comment  In the event of cardiac or respiratory ARREST Do not call a "code blue"   In the event of  cardiac or respiratory ARREST Do not perform Intubation, CPR, defibrillation or ACLS   In the event of cardiac or respiratory ARREST Use medication by any route, position, wound care, and other measures to relive pain and suffering. May use oxygen, suction and manual treatment of airway obstruction as needed for comfort.   Comments Patient has a universal DNR      09/04/22 1346           Code Status History     Date Active Date Inactive Code Status Order ID Comments User Context   08/31/2022 1228 09/04/2022 1346 DNR [id number]  Lucile Shutters, MD ED   11/15/2017 0934 11/16/2017 1745 DNR [id number]  Ledell Peoples, RN Inpatient   11/15/2017 0251 11/15/2017 0933 Full Code [id number]  Arnaldo Natal Inpatient       Prognosis:  < 2 weeks   Thank you for allowing the Palliative Medicine Team to assist in the care of this patient.   Morton Stall, NP  Please contact Palliative Medicine Team phone at [phone] for questions and concerns.

## 2022-09-05 NOTE — Progress Notes (Signed)
Contacted And reported to [place] at [employer] [employer].

## 2022-09-05 NOTE — TOC Transition Note (Signed)
Transition of Care [facility]) - CM/SW Discharge Note   Patient Details  Name: Karen Colon MRN: [id number] Date of Birth: 09-Mar-1926  Transition of Care [facility]) CM/SW Contact:  Colin Broach, LCSW Phone Number: 09/05/2022, 4:07 PM   Clinical Narrative:     Patient discharged to [facility] on [address]  Call report:  [phone].  [facility] rep received paper work and coordinated discharge with family.  Final next level of care: Hospice Medical Facility Barriers to Discharge: No Barriers Identified   Patient Goals and CMS Choice Patient states their goals for this hospitalization and ongoing recovery are:: Hospice CMS Medicare.gov Compare Post Acute Care list provided to:: Patient Represenative (must comment) Choice offered to / list presented to : [place] Post-Acute POA / Guardian  Discharge Placement                       Discharge Plan and Services   Discharge Planning Services: CM Consult Post Acute Care Choice: Resumption of Svcs/PTA Provider, Hospice          DME Arranged: N/A DME Agency: NA       HH Arranged: NA HH Agency: NA        Social Determinants of Health (SDOH) Interventions     Readmission Risk Interventions     No data to display

## 2022-09-05 NOTE — Progress Notes (Signed)
Contacted and notified daughter pt discharged to hospice home today.

## 2022-09-05 NOTE — Discharge Summary (Signed)
Physician Discharge Summary   Patient: Karen Colon MRN: [id number] DOB: 08/26/1926  Admit date:     08/31/2022  Discharge date: 09/05/22  Discharge Physician: Fritzi Mandes   PCP: Dewayne Shorter, MD   Patient has been discharged to the hospice facility. She is full comfort care. She is DNR DNI Discharge Diagnoses: Principal Problem:   Left displaced femoral neck fracture (HCC) Active Problems:   Paroxysmal atrial fibrillation ([place])   Hypothyroidism   Vascular dementia without behavioral disturbance [facility])   [facility] Course: Karen Colon is a 88 y.o. female with medical history significant for advanced dementia, nonambulatory (wheelchair-bound ), hypertension, hypothyroidism, depression, hearing loss who was brought into the ER by EMS for evaluation following an unwitnessed fall at [facility].  Patient was said to have been found on the floor in the bathroom.  She had complaints of left leg pain and her left leg was noted to be shortened and outwardly rotated.    Comfort care status --After much discussions today, daughter elected for comfort care and discharge to hospice facility. --transition off precedex, and use concentrated oral soln of ativan and morphine for calming   Left displaced femoral neck fracture (HCC) S/p left HEMIARTHROPLASTY on 09/01/22. --performed with the goal of pain control   Acute hypoxemic respiratory failure, resolved --post-op, pt was sating 80% on non-rebreather.  Unclear etiology.  CXR no acute finding.     Fall Status post unwitnessed fall with a left femoral neck fracture   Acute delirium Vascular dementia without behavioral disturbance ([place]) Patient with a history of advanced dementia and is wheelchair-bound --prn ativan, haldol and morphine   Hypothyroidism   Paroxysmal atrial fibrillation ([place]) Not on long-term anticoagulation due to high risk for falls --d/c cardizem due to comfort care status   AKI     DVT prophylaxis: Lovenox  SQ Code Status: DNR no intubation  Family Communication: daughter updated ont he phone and aware that pt will be transferring to hospice facility later today  Dispo:   hospice facility today     Consultants: dr Roland Rack Procedures performed: left femoral fracture repair  Disposition: Hospice care Diet recommendation:  Discharge Diet Orders (From admission, onward)     Start     Ordered   09/05/22 0000  Diet - low sodium heart healthy        09/05/22 1003            DISCHARGE MEDICATION: Allergies as of 09/05/2022       Reactions   Ciprofloxacin    Muscle spasm   Iodinated Contrast Media    Iodine DYe   Other    Dust Mites        Medication List     STOP taking these medications    aspirin 81 MG chewable tablet   diclofenac Sodium 1 % Gel Commonly known as: VOLTAREN   diltiazem 120 MG 24 hr capsule Commonly known as: Cardizem CD   levothyroxine 75 MCG tablet Commonly known as: SYNTHROID   polyethylene glycol 17 g packet Commonly known as: MIRALAX / GLYCOLAX   psyllium 58.6 % packet Commonly known as: METAMUCIL       TAKE these medications    glycopyrrolate 1 MG tablet Commonly known as: ROBINUL Take 1 tablet (1 mg total) by mouth every 4 (four) hours as needed (excessive secretions).   haloperidol 2 MG/ML solution Commonly known as: HALDOL Place 0.3 mLs (0.6 mg total) under the tongue every 4 (four) hours as needed  for agitation (or delirium).   LORazepam 2 MG/ML concentrated solution Commonly known as: ATIVAN Take 1 mL (2 mg total) by mouth every 4 (four) hours.   morphine CONCENTRATE 10 MG/0.5ML Soln concentrated solution Take 0.13-0.25 mLs (2.6-5 mg total) by mouth every 2 (two) hours as needed for moderate pain, anxiety or shortness of breath (EOL care).   polyvinyl alcohol 1.4 % ophthalmic solution Commonly known as: LIQUIFILM TEARS Place 1 drop into both eyes 4 (four) times daily as needed for dry eyes.         Condition at  discharge: poor  The results of significant diagnostics from this hospitalization (including imaging, microbiology, ancillary and laboratory) are listed below for reference.   Imaging Studies: DG Chest Port 1 View  Result Date: 09/01/2022 CLINICAL DATA:  Hypoxia.  Postop. EXAM: PORTABLE CHEST 1 VIEW COMPARISON:  08/31/2022 and older exams. FINDINGS: Cardiac silhouette is normal in size. No mediastinal or hilar masses. Mild lung base opacities consistent with atelectasis. Chronic bilateral interstitial prominence. No convincing pneumonia or pulmonary edema. No pleural effusion or pneumothorax. Skeletal structures are grossly intact. IMPRESSION: 1. No acute cardiopulmonary disease.  Mild basilar atelectasis. Electronically Signed   By: Lajean Manes M.D.   On: 09/01/2022 13:53   DG HIP UNILAT WITH PELVIS 2-3 VIEWS LEFT  Result Date: 08/31/2022 CLINICAL DATA:  Provided history: Fall.  Unwitnessed fall. EXAM: DG HIP (WITH OR WITHOUT PELVIS) 2-3V LEFT COMPARISON:  Radiographs of the left hip 06/18/2010. FINDINGS: Acute, displaced and impacted subcapital/transcervical left femoral neck fracture. Chronic fracture deformities of the left superior and inferior pubic rami. Lumbar spondylosis at the imaged levels. IMPRESSION: Acute, displaced and impacted subcapital/transcervical left femoral neck fracture. Electronically Signed   By: Kellie Simmering D.O.   On: 08/31/2022 09:09   DG Chest Port 1 View  Result Date: 08/31/2022 CLINICAL DATA:  Provided history: Hip fracture.  Unwitnessed fall. EXAM: PORTABLE CHEST 1 VIEW COMPARISON:  Chest radiographs 11/15/2017 and earlier. FINDINGS: Heart size within normal limits. Aortic atherosclerosis. Calcified mediastinal/hilar lymph nodes. A 9 mm somewhat nodular opacity projects in the region of the left lung base. Elsewhere, there is no appreciable airspace consolidation. No evidence of pleural effusion or pneumothorax. No acute bony abnormality identified. IMPRESSION: 9 mm  somewhat nodular opacity projecting in the region of the left lung base, new from the prior examination of 11/15/2017. This is concerning for a possible pulmonary nodule, and a chest CT is recommended for further evaluation. Aortic Atherosclerosis (ICD10-I70.0). Calcified mediastinal/hilar lymph nodes, suggesting prior granulomatous disease. Electronically Signed   By: Kellie Simmering D.O.   On: 08/31/2022 09:00    Microbiology: Results for orders placed or performed during the hospital encounter of 08/31/22  MRSA Next Gen by PCR, Nasal     Status: None   Collection Time: 09/01/22  3:56 PM   Specimen: Nasal Mucosa; Nasal Swab  Result Value Ref Range Status   MRSA by PCR Next Gen NOT DETECTED NOT DETECTED Final    Comment: (NOTE) The GeneXpert MRSA Assay (FDA approved for NASAL specimens only), is one component of a comprehensive MRSA colonization surveillance program. It is not intended to diagnose MRSA infection nor to guide or monitor treatment for MRSA infections. Test performance is not FDA approved in patients less than 87 years old. Performed at [facility], [address] [place], [place] [address]     Labs: CBC: Recent Labs  Lab 08/31/22 0817 09/02/22 0436 09/03/22 0310  WBC 8.2 10.4 7.0  NEUTROABS 6.1  --   --   HGB 12.7 9.6* 9.1*  HCT 40.0 29.8* 29.0*  MCV 90.9 91.4 93.2  PLT 327 234 206   Basic Metabolic Panel: Recent Labs  Lab 08/31/22 0818 09/02/22 0436 09/03/22 0310  NA 143 141 142  K 3.7 4.4 3.7  CL 110 111 110  CO2 26 26 27   GLUCOSE 97 132* 99  BUN 19 42* 35*  CREATININE 0.63 1.02* 0.66  CALCIUM 9.1 8.2* 8.2*  MG  --  2.2 2.3   Liver Function Tests: Recent Labs  Lab 08/31/22 0818  AST 19  ALT 16  ALKPHOS 91  BILITOT 0.5  PROT 6.2*  ALBUMIN 3.5   CBG: Recent Labs  Lab 09/01/22 1559 09/01/22 2139  GLUCAP 148* 136*    Discharge time spent: greater than 30 minutes.  Signed: 2140, MD [facility] 09/05/2022

## 2022-09-05 NOTE — Care Management Important Message (Signed)
Important Message  Patient Details  Name: Karen Colon MRN: [id number] Date of Birth: Jul 25, 1926   Medicare Important Message Given:  Other (see comment)  Disposition to discharge to hospice home pending bed availability.  Medicare IM withheld at this time out of respect for patient and family.    Johnell Comings 09/05/2022, 9:36 AM

## 2022-09-05 NOTE — Progress Notes (Signed)
[facility] ICU 16 [facility]) Hospice hospital liaison note  With ongoing issues related to patient's surgery recovery daughter has expressed interest in patient transferring over to hospice facility.  Chart reviewed and hospice home eligibility is confirmed at this time.   Unfortunately hospice home does not have a bed to offer today. TOC is aware hospital liaison will follow up tomorrow or sooner if room becomes available.   Please do not hesitate to call with any hospice related questions or concerns.   Thank you for the opportunity to participate in this patient's care.  Thea Gist, Charity fundraiser, [facility] Liaison  [phone]

## 2022-09-24 DEATH — deceased
